# Patient Record
Sex: Female | Born: 1985
Health system: Southern US, Community
[De-identification: ages and names within clinical notes are randomized; demographics above are authoritative.]

## PROBLEM LIST (undated history)

## (undated) ENCOUNTER — Inpatient Hospital Stay (HOSPITAL_COMMUNITY): Payer: Self-pay

## (undated) DIAGNOSIS — H0520 Unspecified exophthalmos: Secondary | ICD-10-CM

## (undated) DIAGNOSIS — D696 Thrombocytopenia, unspecified: Secondary | ICD-10-CM

## (undated) DIAGNOSIS — Z3490 Encounter for supervision of normal pregnancy, unspecified, unspecified trimester: Secondary | ICD-10-CM

## (undated) DIAGNOSIS — E063 Autoimmune thyroiditis: Secondary | ICD-10-CM

## (undated) HISTORY — DX: Unspecified exophthalmos: H05.20

## (undated) HISTORY — PX: NO PAST SURGERIES: SHX2092

## (undated) HISTORY — DX: Encounter for supervision of normal pregnancy, unspecified, unspecified trimester: Z34.90

## (undated) HISTORY — DX: Autoimmune thyroiditis: E06.3

---
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
## 2004-05-11 ENCOUNTER — Emergency Department (HOSPITAL_COMMUNITY): Admission: EM | Admit: 2004-05-11 | Discharge: 2004-05-11 | Payer: Self-pay | Admitting: *Deleted

## 2004-07-31 ENCOUNTER — Ambulatory Visit (HOSPITAL_COMMUNITY): Admission: RE | Admit: 2004-07-31 | Discharge: 2004-07-31 | Payer: Self-pay | Admitting: Obstetrics and Gynecology

## 2004-08-27 ENCOUNTER — Other Ambulatory Visit: Admission: RE | Admit: 2004-08-27 | Discharge: 2004-08-27 | Payer: Self-pay | Admitting: Obstetrics and Gynecology

## 2004-12-15 ENCOUNTER — Emergency Department (HOSPITAL_COMMUNITY): Admission: EM | Admit: 2004-12-15 | Discharge: 2004-12-16 | Payer: Self-pay | Admitting: Emergency Medicine

## 2005-02-06 ENCOUNTER — Encounter: Admission: RE | Admit: 2005-02-06 | Discharge: 2005-02-06 | Payer: Self-pay | Admitting: Obstetrics and Gynecology

## 2005-03-17 ENCOUNTER — Ambulatory Visit (HOSPITAL_COMMUNITY): Admission: RE | Admit: 2005-03-17 | Discharge: 2005-03-17 | Payer: Self-pay | Admitting: Obstetrics and Gynecology

## 2005-03-17 ENCOUNTER — Ambulatory Visit: Payer: Self-pay | Admitting: *Deleted

## 2005-03-20 ENCOUNTER — Ambulatory Visit: Payer: Self-pay | Admitting: *Deleted

## 2005-03-23 ENCOUNTER — Inpatient Hospital Stay (HOSPITAL_COMMUNITY): Admission: AD | Admit: 2005-03-23 | Discharge: 2005-03-26 | Payer: Self-pay | Admitting: Obstetrics and Gynecology

## 2005-05-08 ENCOUNTER — Ambulatory Visit: Payer: Self-pay | Admitting: "Endocrinology

## 2005-08-31 ENCOUNTER — Other Ambulatory Visit: Admission: RE | Admit: 2005-08-31 | Discharge: 2005-08-31 | Payer: Self-pay | Admitting: Obstetrics and Gynecology

## 2005-11-20 ENCOUNTER — Ambulatory Visit: Payer: Self-pay | Admitting: "Endocrinology

## 2006-05-28 ENCOUNTER — Ambulatory Visit: Payer: Self-pay | Admitting: Family Medicine

## 2006-07-22 ENCOUNTER — Ambulatory Visit: Payer: Self-pay | Admitting: "Endocrinology

## 2006-10-20 ENCOUNTER — Other Ambulatory Visit: Admission: RE | Admit: 2006-10-20 | Discharge: 2006-10-20 | Payer: Self-pay | Admitting: Obstetrics and Gynecology

## 2006-10-21 ENCOUNTER — Ambulatory Visit: Payer: Self-pay | Admitting: "Endocrinology

## 2009-01-04 ENCOUNTER — Inpatient Hospital Stay (HOSPITAL_COMMUNITY): Admission: AD | Admit: 2009-01-04 | Discharge: 2009-01-04 | Payer: Self-pay | Admitting: Obstetrics & Gynecology

## 2009-07-16 ENCOUNTER — Inpatient Hospital Stay (HOSPITAL_COMMUNITY): Admission: AD | Admit: 2009-07-16 | Discharge: 2009-07-16 | Payer: Self-pay | Admitting: Obstetrics & Gynecology

## 2009-07-23 ENCOUNTER — Inpatient Hospital Stay (HOSPITAL_COMMUNITY): Admission: AD | Admit: 2009-07-23 | Discharge: 2009-07-25 | Payer: Self-pay | Admitting: Obstetrics

## 2010-04-03 ENCOUNTER — Ambulatory Visit: Payer: Self-pay | Admitting: "Endocrinology

## 2010-09-25 ENCOUNTER — Ambulatory Visit (INDEPENDENT_AMBULATORY_CARE_PROVIDER_SITE_OTHER): Payer: Medicaid Other | Admitting: "Endocrinology

## 2010-09-25 DIAGNOSIS — E038 Other specified hypothyroidism: Secondary | ICD-10-CM

## 2010-10-05 LAB — CBC
HCT: 35 % — ABNORMAL LOW (ref 36.0–46.0)
Hemoglobin: 10.8 g/dL — ABNORMAL LOW (ref 12.0–15.0)
Hemoglobin: 11.6 g/dL — ABNORMAL LOW (ref 12.0–15.0)
MCHC: 33.2 g/dL (ref 30.0–36.0)
MCV: 94.2 fL (ref 78.0–100.0)
Platelets: 79 10*3/uL — ABNORMAL LOW (ref 150–400)
Platelets: 91 10*3/uL — ABNORMAL LOW (ref 150–400)
RDW: 13.5 % (ref 11.5–15.5)

## 2010-10-27 LAB — POCT PREGNANCY, URINE: Preg Test, Ur: POSITIVE

## 2010-10-27 LAB — URINALYSIS, ROUTINE W REFLEX MICROSCOPIC
Bilirubin Urine: NEGATIVE
Glucose, UA: NEGATIVE mg/dL
Nitrite: NEGATIVE
Specific Gravity, Urine: 1.015 (ref 1.005–1.030)
pH: 6 (ref 5.0–8.0)

## 2010-10-27 LAB — CBC
Hemoglobin: 12.7 g/dL (ref 12.0–15.0)
MCHC: 35.5 g/dL (ref 30.0–36.0)
Platelets: 126 10*3/uL — ABNORMAL LOW (ref 150–400)
RDW: 13.2 % (ref 11.5–15.5)
WBC: 6.4 10*3/uL (ref 4.0–10.5)

## 2010-10-27 LAB — GC/CHLAMYDIA PROBE AMP, GENITAL: Chlamydia, DNA Probe: NEGATIVE

## 2010-10-27 LAB — WET PREP, GENITAL: WBC, Wet Prep HPF POC: NONE SEEN

## 2010-10-27 LAB — URINE MICROSCOPIC-ADD ON

## 2010-12-04 ENCOUNTER — Encounter: Payer: Self-pay | Admitting: *Deleted

## 2010-12-04 DIAGNOSIS — E038 Other specified hypothyroidism: Secondary | ICD-10-CM

## 2010-12-04 DIAGNOSIS — E0500 Thyrotoxicosis with diffuse goiter without thyrotoxic crisis or storm: Secondary | ICD-10-CM | POA: Insufficient documentation

## 2010-12-05 NOTE — H&P (Signed)
NAMEREBA, HULETT NO.:  [id number]   MEDICAL RECORD NO.:  [id number]          PATIENT TYPE:  MAT   LOCATION:  [facility]                          [facility]:  [facility]   PHYSICIAN:  Charles A. Delcambre, MDDATE OF BIRTH:  10-16-1985   DATE OF ADMISSION:  03/23/2005  DATE OF DISCHARGE:                                HISTORY & PHYSICAL   This patient to be admitted on March 23, 2005, for induction at 41-3  weeks estimated gestational age for post dates.  She is a 25 year old, para  0-0-0-0, [facility] March 14, 2005.   PRENATAL LABS:  O positive.  Antibody screen negative.  Sickle cell trait  negative.  VDRL nonreactive.  Rubella immune.  Hepatitis B surface antigen  negative.  HIV negative.  TSH has been consistent with hypothyroidism, a  most recent TSH 19.  At the time of this dictation pending repeat with  Levothyroxine at 200 mcg per day.  Pending repeat of TSH today.  Pap test  negative.  Quad screen negative.  One hour Glucola 113.  Hemoglobin 10.7 at  28 weeks.  Group B strep negative.   PAST MEDICAL HISTORY:  1.  Hypothyroidism.  2.  Anemia.   SURGICAL HISTORY:  None.   MEDICATIONS:  1.  Levothyroxine 200 mcg per day.  2.  Prenatal vitamins.  3.  Iron daily.   ALLERGIES:  No known drug allergies.   SOCIAL HISTORY:  No tobacco, ethanol, or drug abuse.  The patient is married  in a monogamous relationship with her husband.   FAMILY HISTORY:  No major family illnesses other than a maternal aunt with  breast cancer, diabetes in a maternal cousin.   REVIEW OF SYSTEMS:  No fever, chills, nausea, vomiting, diarrhea,  constipation, or bleeding, rupture of membranes, or significant contractions  at the time of this dictation.   PHYSICAL EXAMINATION:  GENERAL:  Alert and oriented  x3.  No distress.  VITAL SIGNS:  Blood pressure 110/70, weight 166 pounds today, respirations  18, pulse 90.  HEENT:  Grossly within normal limits.  NECK:  Supple without thyromegaly  or adenopathy.  LUNGS:  Clear bilaterally.  BACK:  No CVAT.  Vertebral column nontender to palpation.  BREASTS:  No masses, tenderness, discharge, skin or nipple changes  bilaterally.  ABDOMEN:  Soft, nontender.  Fundal height 39.  Right axilla has a fatty  tumor mass that has been negative on ultrasound, raised up beyond the tail  of the breast.  This will be followed till after pregnancy.  May undergo  needle biopsy or something of that nature but it has appeared normal on  ultrasound, off of the breast, not part of the breast, is basically  asymptomatic except for the mass effect and it is soft and nontender.  PELVIC:  Cervix is checked and is tight 3, 75% effaced, -2 station , vertex,  and intact.  EXTREMITIES:  Mild edema bilaterally.  Nontender.   ASSESSMENT:  The patient will be admitted at 41 weeks 3 days to undergo  induction.   If inappropriate for Cervidil that evening, we  will begin Pitocin over  night.  All questions were answered.  She will present if not in labor by  that point.  In the meanwhile, she will have an appointment with Dr.  Mia Creek in my absence from the clinic next week.      Charles A. Sydnee Cabal, MD  Electronically Signed     CAD/MEDQ  D:  03/11/2005  T:  03/11/2005  Job:  [id number]

## 2010-12-05 NOTE — Op Note (Signed)
Grace Wall, Grace Wall               ACCOUNT NO.:  [id number]   MEDICAL RECORD NO.:  [id number]          PATIENT TYPE:  INP   LOCATION:  9167                          [facility]  [facility]   PHYSICIAN:  Charles A. Delcambre, MDDATE OF BIRTH:  [birth year]   DATE OF PROCEDURE:  03/24/2005  DATE OF DISCHARGE:                                 OPERATIVE REPORT   This patient admitted for induction at 41 weeks 3 days, for induction  secondary to post dates.  See history and physical.  She was noted to be 3.5  cm, 90% effaced, -1 station, bulging bag of water, at 0710.  Artificial  rupture of membranes was done.  At that time no complications and clear  fluid was noted.  Unknown group B strep was noted, and she was started on  ampicillin this morning at the time of induction and for that reason, after  being started, this was continued; however, during latent labor she was  noted to have negative group B strep per office chart.  She became  completely dilated with Pitocin.  After Cervidil was removed, Pitocin was  started up to 4 milli-international units per minute, lowered later to 3  milli-international units per minute, with reassuring fetal heart rate  tracing.  She progressed rapidly.  Once becoming active, an epidural was  placed and she became completely dilated at 1117 and delivered by  spontaneous vaginal delivery at 1228.  The placenta followed at 1239.  She  had a vigorous female, Apgars 9 and 9.  The placenta was spontaneous, three-  vessel and intact.  The estimated blood loss was 400 mL.  Right sidewall  laceration was noted, repaired with 2-0 Vicryl and 3-0 chromic with local  anesthetic on top of the epidural.  Father of the baby cut the cord and a  nuchal cord x1 was reduced to effect delivery.  Mother and baby were  recovering stably at this time.      Charles A. Sydnee Cabal, MD  Electronically Signed     CAD/MEDQ  D:  03/24/2005  T:  03/24/2005  Job:  [id number]

## 2011-01-09 ENCOUNTER — Inpatient Hospital Stay (INDEPENDENT_AMBULATORY_CARE_PROVIDER_SITE_OTHER)
Admission: RE | Admit: 2011-01-09 | Discharge: 2011-01-09 | Disposition: A | Discharge status: Home / Self Care | Payer: Medicaid Other | Source: Ambulatory Visit | Attending: Family Medicine | Admitting: Family Medicine

## 2011-01-09 DIAGNOSIS — N6459 Other signs and symptoms in breast: Secondary | ICD-10-CM

## 2011-01-09 DIAGNOSIS — N644 Mastodynia: Secondary | ICD-10-CM

## 2011-03-24 ENCOUNTER — Other Ambulatory Visit: Payer: Self-pay | Admitting: "Endocrinology

## 2011-03-26 ENCOUNTER — Encounter: Payer: Self-pay | Admitting: "Endocrinology

## 2011-03-26 ENCOUNTER — Ambulatory Visit (INDEPENDENT_AMBULATORY_CARE_PROVIDER_SITE_OTHER): Payer: Medicaid Other | Admitting: "Endocrinology

## 2011-03-26 VITALS — BP 105/63 | HR 74 | Wt 145.5 lb

## 2011-03-26 DIAGNOSIS — E063 Autoimmune thyroiditis: Secondary | ICD-10-CM

## 2011-03-26 DIAGNOSIS — H0520 Unspecified exophthalmos: Secondary | ICD-10-CM | POA: Insufficient documentation

## 2011-03-26 DIAGNOSIS — E049 Nontoxic goiter, unspecified: Secondary | ICD-10-CM

## 2011-03-26 DIAGNOSIS — E038 Other specified hypothyroidism: Secondary | ICD-10-CM

## 2011-03-26 DIAGNOSIS — E0500 Thyrotoxicosis with diffuse goiter without thyrotoxic crisis or storm: Secondary | ICD-10-CM | POA: Insufficient documentation

## 2011-03-26 NOTE — Patient Instructions (Signed)
Followup visit in 6 months. Please take Synthroid, 187.5 mcg, 6 days per week. On 7 stay please take only 150 micrograms of Synthroid.

## 2011-03-26 NOTE — Progress Notes (Signed)
Subjective:  Patient Name: Grace Wall Date of Birth: 02/01/1986  MRN: [id number]  Sherion Dooly  presents to the office today for follow-up of her hypothyroidism, Hashimoto's disease, goiter and exophthalmos  HISTORY OF PRESENT ILLNESS:   Grace Wall is a 25 y.o. [place] young woman. Grace Wall was unaccompanied.  1. The patient was first referred to me by herself on 05/08/05. She was diagnosed with thyroid problems at age 77 to at that point she was hyperthyroid, had a big anterior neck, and had big eyes. She was placed on Tapazole ((methimazole). Tapazole was subsequently stopped at about age 35. About one year later, she started Synthroid. The Synthroid was changed to generic levothyroxine in 2005. The patient's eyes have gradually improved over time. She was feeling fairly well, but was tired a lot. She was also somewhat mentally confused at times. On physical examination she had intermittent proptosis bilaterally. The thyroid gland was 20+ grams in size. The right lobe was within normal limits, but the left lobe was slightly enlarged. Laboratory data showed a TSH of 3.09, free T4-1 0.22, and free T3 of 2.6. Her TPO antibody level was 1171.1, with normals being less than 60. A thyroid-stimulating immunoglobulin level was 64, with normals being <129. The thyroid finding inhibitory immunoglobulin was 96, with normas being less than 17. Her TSH receptor antibody was 87, with normals being less than 10. 2 the high levels of the thyroid receptor antibody, it was unclear how her thyroid tests would evolve. Repeat thyroid hormone test performed at 05/18/2005 were markedly different. At this time the TSH was 0.032, and free T4 was 1.77, and free T3 was 3.9. At that point it was still unclear whether the fluctuations in thyroid hormone were due to changes in the forms of levothyroxine made by different companies, or to  changes i thyroid receptor antibody levels, or both. I changed her to brand Synthroid at a dose of  150 mcg per day. During the past 6 years we've had to adjust her thyroid hormone doses frequently. Her TSH values have ranged from 0.04 - 8.58. Part of these fluctuations have been due to flareups of Hashimoto's disease. In addition, because she does not have any health insurance, she's had to rely on thyroid hormone samples. Sometimes she's not always had exactly the right thyroid hormone doses. She also still appears to have fluctuations in her thyroid receptor antibodies which have caused changes in her thyroid hormone levels.  Because of her lack of insurance, however, we not been able to repeat her thyroid receptor antibody testing.  2. The patient's last [facility] visit was on 09/25/10. In the interim, has been fairly healthy. Her current Synthroid dose is 150 mcg per day +37.5 mcg per day (one-half of a 75 mcg  tablet) for total of 187.5 mcg per day.She has been on this dose for approximately 6 months. 3. Pertinent Review of Systems:  Constitutional: The patient feels well, is healthy, and has no significant complaints. Eyes: Vision is good. There are no significant eye complaints. Her right eye is always somewhat more prominent than her left Neck: The patient has no complaints of anterior neck swelling, soreness, tenderness,  pressure, discomfort, or difficulty swallowing.  Heart: Heart rate increases with exercise or other physical activity. The patient has no complaints of palpitations, irregular heat beats, chest pain, or chest pressure. Gastrointestinal: Bowel movents seem normal. The patient has no complaints of excessive hunger, acid reflux, upset stomach, stomach aches or pains, diarrhea, or constipation. Legs: Muscle mass  and strength seem normal. There are no complaints of numbness, tingling, burning, or pain. No edema is noted. Feet: There are no obvious foot problems. There are no complaints of numbness, tingling, burning, or pain. No edema is noted. GYN/GU: Her LMP was 03/02/11. Her periods  have been fairly regular.   PAST MEDICAL, FAMILY, AND SOCIAL HISTORY:  Past Medical History  Diagnosis Date  . Hypothyroidism, acquired, autoimmune   . Thyroiditis, autoimmune   . Thyrotoxicosis with diffuse goiter   . Exophthalmos   . Graves disease   . Graves' disease     Family History  Problem Relation Age of Onset  . Cancer Maternal Aunt   . Thyroid disease Son     Current outpatient prescriptions:levothyroxine (SYNTHROID, LEVOTHROID) 200 MCG tablet, Take 200 mcg by mouth daily.  , Disp: , Rfl: ;  Multiple Vitamin (MULTIVITAMIN) tablet, Take 1 tablet by mouth daily.  , Disp: , Rfl:   Allergies as of 03/26/2011  . (No Known Allergies)    1. 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 2. Activities: Family activities; she is now enrolled in the [employer]. She will be able to have clinical visits with me and certain laboratory tests at no additional cost. 3. Smoking, alcohol, or drugs: None 4. Primary Care Provider: Carollee Herter, MD, MD  ROS: There are no other significant problems involving the patient's other six body systems.   Objective:  Vital Signs:  BP 105/63  Pulse 74  Wt 145 lb 8 oz (65.998 kg)   Ht Readings from Last 3 Encounters:  No data found for Ht   Wt Readings from Last 3 Encounters:  03/26/11 145 lb 8 oz (65.998 kg)   PHYSICAL EXAM: Constitutional: The patient appears healthy and well nourished.  Eyes:  There is no obvious arcus. She has slight inferior proptosis of the right eye. Extraocular movements of her eyes are normal. She feels no sense of eye muscle pressure when looking upward and left or looking upward and right. Moisture appears normal. Mouth: The oropharynx and tongue appear normal. Oral moisture is normal. Neck: The neck appears to be visibly normal. No carotid bruits are noted. The thyroid gland is 20+ grams in size. The right lobe is within normal size. Left lobe is  slightly enlarged. The consistency of the thyroid gland is normal. The thyroid gland is not tender to palpation. Lungs: The lungs are clear to auscultation. Air movement is good. Heart: Heart rate and rhythm are regular.Heart sounds S1 and S2 are normal. I did not appreciate any pathologic cardiac murmurs. Abdomen: The abdomen appears to be normal in size for the patient's age. Bowel sounds are normal. There is no obvious hepatomegaly, splenomegaly, or other mass effect.  Arms: Muscle size and bulk are normal for age. Hands: There is no obvious tremor. Phalangeal and metacarpophalangeal joints are normal. Palmar muscles are normal for age. Palmar skin is normal. Palmar moisture is also normal. Legs: Muscles appear normal for age. No edema is present. Feet: Feet are normally formed. Dorsalis pedal pulses are normal. Neurologic: Strength is normal for age in both the upper and lower extremities. Muscle tone is normal. Sensation to touch is normal in both the legs and feet.    LAB DATA: 9/0/12 TSH was 0.38.   Assessment and Plan:   ASSESSMENT: 1. Hypothyroid/hyperthyroid: The patient is mildly hyperthyroid at this time. She needs a small decrease in Synthroid dosage. 2. Thyroiditis: Her  Hashimoto's disease is clinically quiescent. 3. goiter: Her thyroid gland has shrunk down to almost normal size. 4. Exophthalmos: The right eye remains just slightly prominent.  PLAN: 1. Diagnostic: Repeat her thyroid tests one week prior to her next visit. 2. Therapeutic: I gave the patient samples of Synthroid, 175 mcg per day 3. Patient education: We discussed the fact that we will probably have to adjust her thyroid dose doses every 6-12 months. 4. Follow-up: Return in about 6 months (around 09/23/2011).

## 2011-06-03 ENCOUNTER — Emergency Department (HOSPITAL_COMMUNITY)
Admission: EM | Admit: 2011-06-03 | Discharge: 2011-06-03 | Disposition: A | Discharge status: Home / Self Care | Payer: Medicaid Other | Source: Home / Self Care | Attending: Family Medicine | Admitting: Family Medicine

## 2011-06-03 ENCOUNTER — Encounter (HOSPITAL_COMMUNITY): Payer: Self-pay | Admitting: Emergency Medicine

## 2011-06-03 DIAGNOSIS — J069 Acute upper respiratory infection, unspecified: Secondary | ICD-10-CM

## 2011-06-03 DIAGNOSIS — R05 Cough: Secondary | ICD-10-CM

## 2011-06-03 MED ORDER — AZITHROMYCIN 250 MG PO TABS: 250.0000 mg | ORAL_TABLET | Freq: Every day | ORAL | Status: AC

## 2011-06-03 MED ORDER — GUAIFENESIN-CODEINE 100-10 MG/5ML PO SYRP: 5.0000 mL | ORAL_SOLUTION | Freq: Three times a day (TID) | ORAL | Status: AC | PRN

## 2011-06-03 NOTE — ED Notes (Signed)
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

## 2011-06-03 NOTE — ED Provider Notes (Signed)
History     CSN: [id number] Arrival date & time: 06/03/2011 10:44 AM   First MD Initiated Contact with Patient 06/03/11 1028      Chief Complaint  Patient presents with  . Sinusitis  . Cough    (Consider location/radiation/quality/duration/timing/severity/associated sxs/prior treatment) Patient is a 25 y.o. female presenting with sinusitis and cough. The history is provided by the patient.  Sinusitis  This is a new problem. The problem has been resolved. Associated symptoms include chills, congestion and cough. Pertinent negatives include no ear pain, no sore throat and no shortness of breath.  Cough The current episode started 12 to 24 hours ago. The problem occurs constantly. The cough is productive of sputum. There has been no fever. Associated symptoms include chills, rhinorrhea and myalgias. Pertinent negatives include no ear pain, no sore throat, no shortness of breath and no wheezing. She has tried decongestants for the symptoms.    Past Medical History  Diagnosis Date  . Hypothyroidism, acquired, autoimmune   . Thyroiditis, autoimmune   . Thyrotoxicosis with diffuse goiter   . Exophthalmos   . Graves disease   . Graves' disease     History reviewed. No pertinent past surgical history.  Family History  Problem Relation Age of Onset  . Cancer Maternal Aunt   . Thyroid disease Son     History  Substance Use Topics  . Smoking status: Never Smoker   . Smokeless tobacco: Not on file  . Alcohol Use: Yes     ocassionally    OB History    Grav Para Term Preterm Abortions TAB SAB Ect Mult Living                  Review of Systems  Constitutional: Positive for fever and chills.  HENT: Positive for congestion, rhinorrhea and sneezing. Negative for ear pain, sore throat and trouble swallowing.   Eyes: Negative.   Respiratory: Positive for cough. Negative for shortness of breath and wheezing.   Cardiovascular: Negative.   Gastrointestinal: Negative.     Genitourinary: Negative.   Musculoskeletal: Positive for myalgias.  Skin: Negative.     Allergies  Review of patient's allergies indicates no known allergies.  Home Medications   Current Outpatient Rx  Name Route Sig Dispense Refill  . LEVOTHYROXINE SODIUM 200 MCG PO TABS Oral Take 200 mcg by mouth daily.      Marland Kitchen ONE-DAILY MULTI VITAMINS PO TABS Oral Take 1 tablet by mouth daily.        BP 116/65  Pulse 88  Temp(Src) 97.9 F (36.6 C) (Oral)  Resp 16  SpO2 99%  Physical Exam  Constitutional: She is oriented to person, place, and time. She appears well-developed and well-nourished.  HENT:  Head: Normocephalic and atraumatic.  Right Ear: Tympanic membrane and external ear normal.  Left Ear: Tympanic membrane and external ear normal.  Nose: Nose normal.  Mouth/Throat: Uvula is midline, oropharynx is clear and moist and mucous membranes are normal.  Eyes: EOM are normal. Pupils are equal, round, and reactive to light.  Neck: Normal range of motion.  Cardiovascular: Normal rate and regular rhythm.   Pulmonary/Chest: Effort normal and breath sounds normal.  Neurological: She is alert and oriented to person, place, and time.  Skin: Skin is warm and dry.    ED Course  Procedures (including critical care time)  Labs Reviewed - No data to display No results found.   No diagnosis found.    MDM  Richardo Priest, MD 06/03/11 [id number]

## 2011-09-23 ENCOUNTER — Encounter: Payer: Self-pay | Admitting: "Endocrinology

## 2011-09-23 ENCOUNTER — Ambulatory Visit: Payer: Medicaid Other | Admitting: "Endocrinology

## 2011-09-24 ENCOUNTER — Other Ambulatory Visit: Payer: Self-pay | Admitting: "Endocrinology

## 2011-09-25 LAB — TSH: TSH: 0.111 u[IU]/mL — ABNORMAL LOW (ref 0.350–4.500)

## 2011-12-15 ENCOUNTER — Other Ambulatory Visit: Payer: Self-pay | Admitting: *Deleted

## 2011-12-15 DIAGNOSIS — E038 Other specified hypothyroidism: Secondary | ICD-10-CM

## 2011-12-22 LAB — T3, FREE: T3, Free: 2.7 pg/mL (ref 2.3–4.2)

## 2011-12-24 ENCOUNTER — Encounter: Payer: Self-pay | Admitting: "Endocrinology

## 2011-12-24 ENCOUNTER — Ambulatory Visit (INDEPENDENT_AMBULATORY_CARE_PROVIDER_SITE_OTHER): Payer: Medicaid Other | Admitting: "Endocrinology

## 2011-12-24 VITALS — BP 102/67 | HR 78 | Wt 142.8 lb

## 2011-12-24 DIAGNOSIS — E063 Autoimmune thyroiditis: Secondary | ICD-10-CM

## 2011-12-24 DIAGNOSIS — E049 Nontoxic goiter, unspecified: Secondary | ICD-10-CM

## 2011-12-24 DIAGNOSIS — E038 Other specified hypothyroidism: Secondary | ICD-10-CM

## 2011-12-24 DIAGNOSIS — H0520 Unspecified exophthalmos: Secondary | ICD-10-CM

## 2011-12-24 NOTE — Patient Instructions (Signed)
Follow-up visit in six months. Repeat TFTs one week prior to next visit. Take one 175 mcg Synthroid pill 5 days per week. Skip Synthroid on Sunday. Take one-half of a 175 pill on Wednesday.

## 2011-12-24 NOTE — Progress Notes (Signed)
Subjective:  Patient Name: Grace Wall Date of Birth: 04/12/1986  MRN: [id number]  Grace Wall  presents to the office today for follow-up of her hypothyroidism, Hashimoto's disease, goiter and exophthalmos  HISTORY OF PRESENT ILLNESS:   Grace Wall is a 26 y.o. [place] young woman. Grace Wall was accompanied by her 74-year-old son.  1. The patient was first referred to me by herself on 05/08/05. She was diagnosed with thyroid problems at age 59. At that point she was hyperthyroid, had a big anterior neck, and had big eyes, so she presumably had Graves' disease. She was placed on Tapazole ((methimazole). Tapazole was subsequently stopped at about age 57. About one year later, she became hypothyroid, presumably due to coexistent Hashimoto's disease. She started Synthroid. The Synthroid was changed to generic levothyroxine in 2005. The patient's eyes have gradually improved over time. She was feeling fairly well, but was tired a lot. She was also somewhat mentally confused at times. On physical examination she had intermittent proptosis bilaterally. The thyroid gland was 20+ grams in size. The right lobe was within normal limits, but the left lobe was slightly enlarged. Laboratory data showed a TSH of 3.09, free T4 1.22, and free T3 of 2.6. Her TPO antibody level was 1171.1, with normals being less than 60. A thyroid-stimulating immunoglobulin level was 64, with normals being <129. The thyroid binding inhibitory immunoglobulin was 96, with normas being less than 17. Her TSH receptor antibody was 87, with normals being less than 10. 2. Given the high levels of the thyroid receptor antibody, it was unclear how her thyroid tests would evolve. Repeat thyroid hormone tess performed on 05/18/2005 were markedly different. At this time the TSH was 0.032,  free T4 was 1.77, and free T3 was 3.9. At that point it was still unclear whether the fluctuations in thyroid hormone were due to changes in the forms of levothyroxine  made by different companies, or to  changes in thyroid receptor antibody levels, or both. I changed her to brand Synthroid at a dose of 150 mcg per day.  2. During the past 7 years we've had to adjust her thyroid hormone doses frequently. Her TSH values have ranged from 0.04 - 8.58. Part of these fluctuations have been due to flareups of Hashimoto's disease. In addition, because she does not have any health insurance, she's had to rely on thyroid hormone samples. Sometimes she's not always had exactly the right thyroid hormone doses. She also still appears to have fluctuations in her thyroid receptor antibodies which have caused changes in her thyroid hormone levels.  Because of her lack of insurance, however, we not been able to repeat her thyroid receptor antibody testing.  3. The patient's last [facility] visit was on 03/26/11. In the interim, she has been fairly healthy. Her current Synthroid dose is 175 mcg 6 days per week .  4. Pertinent Review of Systems:  Constitutional: The patient feels tired a lot. She also has pains in her stomach when she eats. She is nauseated frequently. She sleeps well for about six hours per night.   Eyes: Vision is good. There are no significant eye complaints. Her right eye is always somewhat more prominent than her left Neck: The patient has no complaints of anterior neck swelling, soreness, tenderness,  pressure, discomfort, or difficulty swallowing.  Heart: Heart rate increases with exercise or other physical activity. The patient has no complaints of palpitations, irregular heat beats, chest pain, or chest pressure. Gastrointestinal: Intermittent nausea, belly hunger, and epigastric discomfort  began about a month ago. She has been taking ibuprofen more frequently in the past month to relieve some upper back pains. The abdominal discomfort is maximal before eating and is relieved by eating. Bowel movents seem normal. The patient has no complaints of acid reflux, diarrhea, or  constipation. Legs: Muscle mass and strength seem normal. There are no complaints of numbness, tingling, burning, or pain. No edema is noted. Feet: There are no obvious foot problems. There are no complaints of numbness, tingling, burning, or pain. No edema is noted. GYN/GU: Her LMP was about mid May. Her periods have been fairly regular.   PAST MEDICAL, FAMILY, AND SOCIAL HISTORY:  Past Medical History  Diagnosis Date  . Hypothyroidism, acquired, autoimmune   . Thyroiditis, autoimmune   . Thyrotoxicosis with diffuse goiter   . Exophthalmos   . Graves disease   . Graves' disease     Family History  Problem Relation Age of Onset  . Cancer Maternal Aunt   . Thyroid disease Son     Current outpatient prescriptions:levothyroxine (SYNTHROID, LEVOTHROID) 200 MCG tablet, Take 175 mcg by mouth daily. , Disp: , Rfl: ;  Multiple Vitamin (MULTIVITAMIN) tablet, Take 1 tablet by mouth daily.  , Disp: , Rfl:   Allergies as of 12/24/2011  . (No Known Allergies)    1. 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 2. Activities: She is now enrolled in the [employer]. She will be able to have clinical visits with me and certain laboratory tests at no additional cost. She is getting Synthroid through that company's patient assistance program. She is also receiving some Medicaid assistance that allows her to have some lab testing done at [facility].  3. Smoking, alcohol, or drugs: None 4. Primary Care Provider: None, but she is listed as having Dr. Sharlot Gowda as her PCP. She will call his office to see if he will accept her as a [facility] charity patient.  ROS: There are no other significant problems involving the patient's other body systems.   Objective:  Vital Signs:  BP 102/67  Pulse 78  Wt 142 lb 12.8 oz (64.774 kg)    Wt Readings from Last 3 Encounters:  12/24/11 142 lb 12.8 oz (64.774 kg)  03/26/11 145 lb 8 oz (65.998 kg)    PHYSICAL EXAM: Constitutional: The patient appears healthy and well nourished.  Eyes:  There is no obvious arcus. The right eye is still slightly more prominent than the left. She has no inferior proptosis of the right eye today. Extraocular movements of her eyes are normal. She feels no sense of eye muscle pressure when looking upward and left or looking upward and right. Moisture appears normal. Mouth: The oropharynx and tongue appear normal. Oral moisture is normal. Neck: The neck appears to be visibly normal. No carotid bruits are noted. The thyroid gland is 20+ grams in size. The right lobe is within normal size. Left lobe is slightly enlarged. The consistency of the thyroid gland is normal. The thyroid gland is not tender to palpation. Lungs: The lungs are clear to auscultation. Air movement is good. Heart: Heart rate and rhythm are regular.Heart sounds S1 and S2 are normal. I did not appreciate any pathologic cardiac murmurs. Abdomen: The abdomen is mildly enlarged. Bowel sounds are normal. There is no obvious hepatomegaly, splenomegaly, or other mass effect.  Arms: Muscle size and bulk are normal for age. Hands: There is no obvious tremor. Phalangeal  and metacarpophalangeal joints are normal. Palmar muscles are normal for age. Palmar skin is normal. Palmar moisture is also normal. Legs: Muscles appear normal for age. No edema is present. Feet: Feet are normally formed. Dorsalis pedal pulses are normal. Neurologic: Strength is normal for age in both the upper and lower extremities. Muscle tone is normal. Sensation to touch is normal in both the legs and feet.    LAB DATA: 12/15/11: TSH was 0.520, free T4 1.40, free T3  2.7   Assessment and Plan:   ASSESSMENT: 1. Hypothyroid/hyperthyroid: The patient is mildly hyperthyroid at this time. She needs a small decrease in Synthroid dosage. 2. Thyroiditis: Her Hashimoto's disease is clinically quiescent. 3. Goiter: Her thyroid gland has shrunk  down to almost normal size. 4. Exophthalmos: The right eye is just slightly prominent today. Her exophthalmos due to Graves' disease has almost resolved. Marland Kitchen  PLAN: 1. Diagnostic: Repeat her thyroid tests one week prior to her next visit. 2. Therapeutic: Reduce Synthroid to 175 5 days per week. Skip Synthroid on Sunday and take 1/2 pill on Wednesday. 3. Patient education: We discussed the fact that we will probably have to adjust her thyroid dose doses every 6-12 months. 4. Follow-up: 6 months  Level of Service: This visit lasted in excess of 40 minutes. More than 50% of the visit was devoted to counseling.  David Stall

## 2012-06-13 ENCOUNTER — Other Ambulatory Visit: Payer: Self-pay | Admitting: *Deleted

## 2012-06-13 DIAGNOSIS — E038 Other specified hypothyroidism: Secondary | ICD-10-CM

## 2012-06-27 LAB — T4, FREE: Free T4: 1.18 ng/dL (ref 0.80–1.80)

## 2012-06-27 LAB — TSH: TSH: 3.814 u[IU]/mL (ref 0.350–4.500)

## 2012-06-27 LAB — T3, FREE: T3, Free: 2.7 pg/mL (ref 2.3–4.2)

## 2012-07-15 ENCOUNTER — Other Ambulatory Visit: Payer: Self-pay | Admitting: *Deleted

## 2012-07-15 DIAGNOSIS — E038 Other specified hypothyroidism: Secondary | ICD-10-CM

## 2012-08-03 ENCOUNTER — Encounter: Payer: Self-pay | Admitting: "Endocrinology

## 2012-08-03 ENCOUNTER — Ambulatory Visit (INDEPENDENT_AMBULATORY_CARE_PROVIDER_SITE_OTHER): Payer: Medicaid Other | Admitting: "Endocrinology

## 2012-08-03 VITALS — BP 107/52 | HR 67 | Wt 150.0 lb

## 2012-08-03 DIAGNOSIS — E0500 Thyrotoxicosis with diffuse goiter without thyrotoxic crisis or storm: Secondary | ICD-10-CM

## 2012-08-03 DIAGNOSIS — E038 Other specified hypothyroidism: Secondary | ICD-10-CM

## 2012-08-03 DIAGNOSIS — E063 Autoimmune thyroiditis: Secondary | ICD-10-CM

## 2012-08-03 DIAGNOSIS — E663 Overweight: Secondary | ICD-10-CM | POA: Insufficient documentation

## 2012-08-03 LAB — TSH: TSH: 3.078 u[IU]/mL (ref 0.350–4.500)

## 2012-08-03 LAB — T4, FREE: Free T4: 1.14 ng/dL (ref 0.80–1.80)

## 2012-08-03 NOTE — Progress Notes (Signed)
Subjective:  Patient Name: Grace Wall Date of Birth: [birth year]  MRN: [id number]  Grace Wall  presents to the office today for follow-up of her hypothyroidism, Hashimoto's disease, goiter and exophthalmos  HISTORY OF PRESENT ILLNESS:   Grace Wall is a 27 y.o. [place] young woman. Korynn was accompanied by her 45-year-old son.  1. The patient was first referred to me by herself on 05/08/05.   A. She was diagnosed with thyroid problems at age 40. At that point she was hyperthyroid, had a big anterior neck, and had big eyes, so she presumably had Graves' disease. She was placed on Tapazole ((methimazole). Tapazole was subsequently stopped at about age 45.  B. About one year later, she became hypothyroid, presumably due to coexistent Hashimoto's disease. She started Synthroid. The Synthroid was changed to generic levothyroxine in 2005. The patient's eyes had gradually improved over time. At that first visit with me, she was feeling fairly well, but was tired a lot. She was also somewhat mentally confused at times.   C. On physical examination she had intermittent proptosis bilaterally. The thyroid gland was 20+ grams in size. The right lobe was within normal limits, but the left lobe was slightly enlarged. Laboratory data showed a TSH of 3.09, free T4 1.22, and free T3 of 2.6. Her TPO antibody level was 1171.1, with normals being less than 60. A thyroid-stimulating immunoglobulin level was 64, with normals being <129. The thyroid binding inhibitory immunoglobulin was 96, with normals being less than 17. Her TSH receptor antibody was 87, with normals being less than 10. 2. Given the high levels of the thyroid receptor antibody, it was unclear how her thyroid tests would evolve. Repeat thyroid hormone tess performed on 05/18/2005 were markedly different. At this time the TSH was 0.032,  free T4 was 1.77, and free T3 was 3.9. At that point it was still unclear whether the fluctuations in thyroid hormone were  due to changes in the forms of levothyroxine made by different companies, or to  changes in thyroid receptor antibody levels, or both. I changed her to brand Synthroid at a dose of 150 mcg per day.  2. During the past 8 years we've had to adjust her thyroid hormone doses frequently. Her TSH values have ranged from 0.04 - 8.58. Part of these fluctuations have been due to flare ups of Hashimoto's disease. In addition, because she does not have any health insurance, she's had to rely on thyroid hormone samples. Sometimes she's not always had exactly the right thyroid hormone doses. She also still appears to have fluctuations in her thyroid receptor antibodies which have caused changes in her thyroid hormone levels.  Because of her lack of insurance, however, we not been able to repeat her thyroid receptor antibody testing.  3. The patient's last [facility] visit was on 12/24/11. In the interim, she has been fairly healthy. Her current Synthroid dose is 175 mcg 5 days per week, but only 1/2 pill on Wednesdays and Sundays. .  4. Pertinent Review of Systems:  Constitutional: The patient feels "good, better". Sometimes she feels tired in her eyes as well as generally tired. The stomach pains and nausea have resolved after stopping ibuprofen.  She sleeps well for about six hours per night.   Eyes: Vision is good. There are no significant eye complaints. Her right eye is always very slightly more prominent than her left Neck: The patient has no complaints of anterior neck swelling, soreness, tenderness,  pressure, discomfort, or difficulty swallowing.  Heart:  Heart rate increases with exercise or other physical activity. The patient has no complaints of palpitations, irregular heat beats, chest pain, or chest pressure. Gastrointestinal: She stopped taking ibuprofen after her last visit. Bowel movents seem normal. The patient has no complaints of acid reflux, diarrhea, or constipation. Legs: Muscle mass and strength seem  normal. There are no complaints of numbness, tingling, burning, or pain. No edema is noted. Feet: There are no obvious foot problems. There are no complaints of numbness, tingling, burning, or pain. No edema is noted. GYN/GU: Her LMP was about mid-December. Her periods have been fairly regular.   PAST MEDICAL, FAMILY, AND SOCIAL HISTORY:  Past Medical History  Diagnosis Date  . Hypothyroidism, acquired, autoimmune   . Thyroiditis, autoimmune   . Thyrotoxicosis with diffuse goiter   . Exophthalmos   . Graves disease   . Graves' disease     Family History  Problem Relation Age of Onset  . Cancer Maternal Aunt   . Thyroid disease Son     Current outpatient prescriptions:levothyroxine (SYNTHROID, LEVOTHROID) 200 MCG tablet, Take 175 mcg by mouth daily. , Disp: , Rfl: ;  Multiple Vitamin (MULTIVITAMIN) tablet, Take 1 tablet by mouth daily.  , Disp: , Rfl:   Allergies as of 08/03/2012  . (No Known Allergies)    1. 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 2. Activities: She is now enrolled in the [employer]. She will be able to have clinical visits with me and certain laboratory tests at no additional cost. She is getting Synthroid through that company's patient assistance program. She is also receiving some Medicaid assistance that allows her to have some lab testing done at [facility].  3. Smoking, alcohol, or drugs: None 4. Primary Care Provider: None. She called Dr. Ilene Qua office to see if he would accept her as a [facility] charity patient, but he would not.   REVIEW OF SYSTEMS: There are no other significant problems involving the patient's other body systems.   Objective:  Vital Signs:  BP 107/52  Pulse 67  Wt 150 lb (68.04 kg)    Wt Readings from Last 3 Encounters:  08/03/12 150 lb (68.04 kg)  12/24/11 142 lb 12.8 oz (64.774 kg)  03/26/11 145 lb 8 oz (65.998 kg)   PHYSICAL EXAM: Constitutional: The  patient appears healthy and well nourished.  Eyes:  There is no obvious arcus. The right eye is only minimally more prominent than the left, but still well within normal. She has no inferior proptosis of the right eye today. Extraocular movements of her eyes are normal. She feels no sense of eye muscle pressure when looking upward and left or looking upward and right. Moisture appears normal. Mouth: The oropharynx and tongue appear normal. Oral moisture is normal. Neck: The neck appears to be visibly normal. No carotid bruits are noted. The thyroid gland is 20+ grams in size. The right lobe is within normal size. Left lobe is minimally enlarged. The consistency of the thyroid gland is normal. The thyroid gland is not tender to palpation. Lungs: The lungs are clear to auscultation. Air movement is good. Heart: Heart rate and rhythm are regular. Heart sounds S1 and S2 are normal. I did not appreciate any pathologic cardiac murmurs. Abdomen: The abdomen is mildly enlarged. Bowel sounds are normal. There is no obvious hepatomegaly, splenomegaly, or other mass effect.  Arms: Muscle size and bulk are normal for age. Hands: There  is no obvious tremor. Phalangeal and metacarpophalangeal joints are normal. Palmar muscles are normal for age. Palmar skin is normal. Palmar moisture is also normal. Legs: Muscles appear normal for age. No edema is present. Neurologic: Strength is normal for age in both the upper and lower extremities. Muscle tone is normal. Sensation to touch is normal in both legs.    LAB DATA:  08/02/12: TSH 3.078, free T4 1.14, free T3 2.7 12/15/11: TSH was 0.520, free T4 1.40, free T3  2.7   Assessment and Plan:   ASSESSMENT: 1. Hypothyroid/hyperthyroid: The patient is very slightly hypothyroid at this time. She needs a small increase in Synthroid dosage. 2. Thyroiditis: Her Hashimoto's disease is clinically quiescent. 3. Goiter: Her thyroid gland is even smaller today, essentially within  normal limits for size. 4. Exophthalmos: The right eye is just minimally prominent today. If I did not know she had had Graves' eye disease, I would never know. Her exophthalmos due to Graves' disease has essentially resolved.  5. Overweight: she has gained 8 pounds in the past 6 months, equivalent to about 130 calories per day.   PLAN: 1. Diagnostic: Repeat her thyroid tests in 3 months and one week prior to her next visit. 2. Therapeutic: Increase Synthroid to 175 mcg pills 6 days per week, but take only 1/2 pill on Sundays.  3. Patient education: We discussed the fact that we will probably have to adjust her thyroid dose doses every 6-12 months. 4. Follow-up: 6 months  Level of Service: This visit lasted in excess of 40 minutes. More than 50% of the visit was devoted to counseling.  David Stall

## 2012-08-03 NOTE — Patient Instructions (Signed)
Follow up visit in 6 months. Please increase your Synthroid dose to one 175 mcg pill 6 days per week, but only one-half pill on Sundays.

## 2013-01-02 ENCOUNTER — Other Ambulatory Visit: Payer: Self-pay | Admitting: *Deleted

## 2013-01-02 DIAGNOSIS — E038 Other specified hypothyroidism: Secondary | ICD-10-CM

## 2013-01-31 LAB — T3, FREE: T3, Free: 2.8 pg/mL (ref 2.3–4.2)

## 2013-02-01 ENCOUNTER — Ambulatory Visit (INDEPENDENT_AMBULATORY_CARE_PROVIDER_SITE_OTHER): Payer: Medicaid Other | Admitting: "Endocrinology

## 2013-02-01 ENCOUNTER — Encounter: Payer: Self-pay | Admitting: "Endocrinology

## 2013-02-01 VITALS — BP 86/51 | HR 72 | Wt 154.1 lb

## 2013-02-01 DIAGNOSIS — R1013 Epigastric pain: Secondary | ICD-10-CM

## 2013-02-01 DIAGNOSIS — R110 Nausea: Secondary | ICD-10-CM

## 2013-02-01 DIAGNOSIS — R5383 Other fatigue: Secondary | ICD-10-CM

## 2013-02-01 DIAGNOSIS — K3189 Other diseases of stomach and duodenum: Secondary | ICD-10-CM

## 2013-02-01 DIAGNOSIS — R5381 Other malaise: Secondary | ICD-10-CM

## 2013-02-01 DIAGNOSIS — I951 Orthostatic hypotension: Secondary | ICD-10-CM

## 2013-02-01 DIAGNOSIS — E0500 Thyrotoxicosis with diffuse goiter without thyrotoxic crisis or storm: Secondary | ICD-10-CM

## 2013-02-01 DIAGNOSIS — E038 Other specified hypothyroidism: Secondary | ICD-10-CM

## 2013-02-01 DIAGNOSIS — E063 Autoimmune thyroiditis: Secondary | ICD-10-CM

## 2013-02-01 DIAGNOSIS — E663 Overweight: Secondary | ICD-10-CM

## 2013-02-01 LAB — CBC WITH DIFFERENTIAL/PLATELET
Basophils Absolute: 0 10*3/uL (ref 0.0–0.1)
Eosinophils Absolute: 0 10*3/uL (ref 0.0–0.7)
Eosinophils Relative: 1 % (ref 0–5)
MCH: 25.8 pg — ABNORMAL LOW (ref 26.0–34.0)
MCV: 76 fL — ABNORMAL LOW (ref 78.0–100.0)
Monocytes Absolute: 0.3 10*3/uL (ref 0.1–1.0)
Platelets: 175 10*3/uL (ref 150–400)
RDW: 14.6 % (ref 11.5–15.5)

## 2013-02-01 MED ORDER — RANITIDINE HCL 150 MG PO TABS: 150.0000 mg | ORAL_TABLET | Freq: Two times a day (BID) | ORAL | Status: DC

## 2013-02-01 NOTE — Progress Notes (Signed)
Subjective:  Patient Name: Grace Wall Date of Birth: [birth year]  MRN: [id number]  Ron Junco  presents to the office today for follow-up of her hypothyroidism, Hashimoto's disease, goiter and exophthalmos  HISTORY OF PRESENT ILLNESS:   Shelvia is a 27 y.o. [place] young woman. Jobeth was accompanied by her 37-year-old son and 78-year-old daughter.   1. The patient was first referred to me by herself on 05/08/05.   A. She was diagnosed with thyroid problems at age 63. At that point she was hyperthyroid, had a big anterior neck, and had big eyes, so she presumably had Graves' disease. She was placed on Tapazole ((methimazole). Tapazole was subsequently stopped at about age 65.  B. About one year later, she became hypothyroid, presumably due to coexistent Hashimoto's disease. She started Synthroid. The Synthroid was changed to generic levothyroxine in 2005. The patient's eyes had gradually improved over time. At that first visit with me, she was feeling fairly well, but was tired a lot. She was also somewhat mentally confused at times.   C. On physical examination she had intermittent proptosis bilaterally. The thyroid gland was 20+ grams in size. The right lobe was within normal limits, but the left lobe was slightly enlarged. Laboratory data showed a TSH of 3.09, free T4 1.22, and free T3 of 2.6. Her TPO antibody level was 1171.1, with normals being less than 60. A thyroid-stimulating immunoglobulin level was 64, with normals being <129. The thyroid binding inhibitory immunoglobulin was 96, with normals being less than 17. Her TSH receptor antibody was 87, with normals being less than 10. 2. Given the high levels of the thyroid receptor antibody, it was unclear how her thyroid tests would evolve. Repeat thyroid hormone tests performed on 05/18/2005 were markedly different. At this time the TSH was 0.032,  free T4 was 1.77, and free T3 was 3.9. At that point it was still unclear whether the  fluctuations in thyroid hormone were due to changes in the forms of levothyroxine made by different companies, or to  changes in thyroid receptor antibody levels, or both. I changed her to brand Synthroid at a dose of 150 mcg per day.   2. During the past 8 years we've had to adjust her thyroid hormone doses frequently. Her TSH values have ranged from 0.04 - 8.58. Part of these fluctuations have been due to flare ups of Hashimoto's disease. In addition, because she does not have any health insurance, she's had to rely on thyroid hormone samples. Sometimes she's not always had exactly the right thyroid hormone doses. She also still appears to have fluctuations in her thyroid receptor antibodies which have caused changes in her thyroid hormone levels.  Because of her lack of insurance, however, we not been able to repeat her thyroid receptor antibody testing.   3. The patient's last [facility] visit was on 08/03/12. In the interim, she has been fairly healthy. Her current Synthroid dose is 175 mcg 6 days per week, but only 1/2 pill on Sundays. .   4. Pertinent Review of Systems:  Constitutional: The patient feels "good", but she still feels tired a lot. She feels that she sleeps good. She does not snore. The stomach pains and nausea have resolved after stopping ibuprofen. When she stands up she often gets dizzy, a lightheaded dizziness.    Eyes: Her eyes get blurry after using the computer or watching a lot of TV. Vision is good otherwise. There are no other significant eye complaints. Her right eye is always  very slightly more prominent than her left Neck: The patient has no complaints of anterior neck swelling, soreness, tenderness,  pressure, discomfort, or difficulty swallowing.  Heart: Heart rate increases with exercise or other physical activity. The patient has no complaints of palpitations, irregular heat beats, chest pain, or chest pressure. Gastrointestinal: She is nauseated a lot. Sometimes she feels  too sick to her stomach to eat. When she eats she feels better. She also had more reflux last week. Bowel movents seem normal. The patient has no complaints of diarrhea or constipation. Legs: Muscle mass and strength seem normal. There are no complaints of numbness, tingling, burning, or pain. No edema is noted. Feet: There are no obvious foot problems. There are no complaints of numbness, tingling, burning, or pain. No edema is noted. GYN/GU: Her LMP was last week. Her periods have been fairly regular.   PAST MEDICAL, FAMILY, AND SOCIAL HISTORY:  Past Medical History  Diagnosis Date  . Hypothyroidism, acquired, autoimmune   . Thyroiditis, autoimmune   . Thyrotoxicosis with diffuse goiter   . Exophthalmos   . Graves disease   . Graves' disease     Family History  Problem Relation Age of Onset  . Cancer Maternal Aunt   . Thyroid disease Son     Current outpatient prescriptions:levothyroxine (SYNTHROID, LEVOTHROID) 200 MCG tablet, Take 175 mcg by mouth daily. , Disp: , Rfl: ;  Multiple Vitamin (MULTIVITAMIN) tablet, Take 1 tablet by mouth daily.  , Disp: , Rfl:   Allergies as of 02/01/2013  . (No Known Allergies)    1. 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 2. Activities: She is now enrolled in the [employer]. She will be able to have clinical visits with me and certain laboratory tests at no additional cost. She is getting Synthroid through that company's patient assistance program. She is also receiving some Medicaid assistance that allows her to have some lab testing done at [facility].  3. Smoking, alcohol, or drugs: None 4. Primary Care Provider: None.   REVIEW OF SYSTEMS: There are no other significant problems involving the patient's other body systems.   Objective:  Vital Signs:  BP 86/51  Pulse 72  Wt 154 lb 1.6 oz (69.899 kg)  Repeat 100/62, HR 68  Wt Readings from Last 3 Encounters:  02/01/13 154 lb  1.6 oz (69.899 kg)  08/03/12 150 lb (68.04 kg)  12/24/11 142 lb 12.8 oz (64.774 kg)   PHYSICAL EXAM: Constitutional: The patient appears healthy and well nourished.  Eyes:  There is no obvious arcus. The right eye is normal today. The left eye is also normal, but perhaps a bit more prominent. She has no inferior proptosis of either eye today. Extraocular movements of her eyes are normal. She feels no sense of eye muscle pressure when looking upward and left or looking upward and right. Moisture appears normal. Mouth: The oropharynx and tongue appear normal. Oral moisture is normal. There is no hyperpigmentation.  Neck: The neck appears to be visibly normal. No carotid bruits are noted. The thyroid gland is smaller at 20 grams in size. The consistency of the thyroid gland is normal. The thyroid gland is not tender to palpation. Lungs: The lungs are clear to auscultation. Air movement is good. Heart: Heart rate and rhythm are regular. Heart sounds S1 and S2 are normal. I did not appreciate any pathologic cardiac murmurs. Abdomen: The abdomen is mildly enlarged. Bowel sounds are  normal. There is no obvious hepatomegaly, splenomegaly, or other mass effect.  Arms: Muscle size and bulk are normal for age. Hands: There is no obvious tremor. Phalangeal and metacarpophalangeal joints are normal. Palmar muscles are normal for age. Palmar skin is normal. Palmar moisture is also normal. There is no hyperpigmentation.  Legs: Muscles appear normal for age. No edema is present. Neurologic: Strength is normal for age in both the upper and lower extremities. Muscle tone is normal. Sensation to touch is normal in both legs.    LAB DATA:  01/30/13; TSH 1.397, free T4 1.44, free T3 2.8 08/02/12: TSH 3.078, free T4 1.14, free T3 2.7 12/15/11: TSH was 0.520, free T4 1.40, free T3  2.7   Assessment and Plan:   ASSESSMENT: 1. Hypothyroid/hyperthyroid: The patient is euthyroid now on her current Synthroid regimen. 2.  Thyroiditis: Her Hashimoto's disease is clinically quiescent. 3. Goiter: Her thyroid gland is even smaller today, now within normal limits for size. 4. Exophthalmos: The eyes are normal today. If I did not know she had had Graves' eye disease, I would not be able to diagnose it now. Her exophthalmos due to Graves' disease has essentially resolved.  5. Overweight: She has gained 4 pounds in the past 6 months, equivalent to about 80 excess calories per day.  6. Fatigue/hypotension: Her fatigue is not due to hypothyroidism. She has orthostatic hypotensive symptoms when she stands up.  This hypotension is probably due in part to relative dehydration. She admittedly doesn't drink a lot of fluids. She could have iron deficiency or anemia again. I doubt that she has Addison's disease, but we have to keep that condition in mind.  7. Nausea and dyspepsia: She may benefit from ranitidine.   PLAN: 1. Diagnostic: Repeat her thyroid tests in 6 months. CBC and iron today. 2. Therapeutic: Continue Synthroid doses of  175 mcg pills 6 days per week, but only 1/2 pill on Sundays. Ranitidine, 150 mg, twice daily.  3. Patient education: We discussed the fact that we will probably have to adjust her thyroid dose doses every 6-12 months. 4. Follow-up: 6 months  Level of Service: This visit lasted in excess of 40 minutes. More than 50% of the visit was devoted to counseling.  David Stall

## 2013-02-01 NOTE — Patient Instructions (Signed)
Follow up visit in 6 months. 

## 2013-02-02 DIAGNOSIS — R1013 Epigastric pain: Secondary | ICD-10-CM | POA: Insufficient documentation

## 2013-02-02 DIAGNOSIS — E663 Overweight: Secondary | ICD-10-CM | POA: Insufficient documentation

## 2013-02-02 DIAGNOSIS — R5381 Other malaise: Secondary | ICD-10-CM | POA: Insufficient documentation

## 2013-02-02 DIAGNOSIS — I951 Orthostatic hypotension: Secondary | ICD-10-CM | POA: Insufficient documentation

## 2013-02-06 ENCOUNTER — Other Ambulatory Visit: Payer: Self-pay | Admitting: *Deleted

## 2013-02-06 ENCOUNTER — Telehealth: Payer: Self-pay | Admitting: *Deleted

## 2013-02-06 DIAGNOSIS — E038 Other specified hypothyroidism: Secondary | ICD-10-CM

## 2013-02-06 MED ORDER — LEVOTHYROXINE SODIUM 175 MCG PO TABS: 175.0000 ug | ORAL_TABLET | Freq: Every day | ORAL | Status: DC

## 2013-02-06 NOTE — Telephone Encounter (Signed)
na

## 2013-02-07 ENCOUNTER — Other Ambulatory Visit: Payer: Self-pay | Admitting: *Deleted

## 2013-02-07 DIAGNOSIS — E038 Other specified hypothyroidism: Secondary | ICD-10-CM

## 2013-02-07 MED ORDER — LEVOTHYROXINE SODIUM 175 MCG PO TABS: 175.0000 ug | ORAL_TABLET | Freq: Every day | ORAL | Status: DC

## 2013-06-30 ENCOUNTER — Other Ambulatory Visit: Payer: Self-pay | Admitting: *Deleted

## 2013-06-30 DIAGNOSIS — E038 Other specified hypothyroidism: Secondary | ICD-10-CM

## 2013-07-21 ENCOUNTER — Emergency Department (INDEPENDENT_AMBULATORY_CARE_PROVIDER_SITE_OTHER)
Admission: EM | Admit: 2013-07-21 | Discharge: 2013-07-21 | Disposition: A | Discharge status: Home / Self Care | Payer: Medicaid Other | Source: Home / Self Care | Attending: Family Medicine | Admitting: Family Medicine

## 2013-07-21 ENCOUNTER — Encounter (HOSPITAL_COMMUNITY): Payer: Self-pay | Admitting: Emergency Medicine

## 2013-07-21 DIAGNOSIS — B356 Tinea cruris: Secondary | ICD-10-CM

## 2013-07-21 MED ORDER — TERBINAFINE HCL 1 % EX CREA: 1.0000 "application " | TOPICAL_CREAM | Freq: Two times a day (BID) | CUTANEOUS | Status: DC

## 2013-07-21 NOTE — ED Provider Notes (Signed)
CSN: [id number]     Arrival date & time 07/21/13  1454 History   First MD Initiated Contact with Patient 07/21/13 1647     Chief Complaint  Patient presents with  . Rash   (Consider location/radiation/quality/duration/timing/severity/associated sxs/prior Treatment) Patient is a 28 y.o. female presenting with rash. The history is provided by the patient and the spouse.  Rash Location:  Ano-genital Ano-genital rash location:  Vulva Quality: itchiness   Severity:  Mild Onset quality:  Gradual Duration:  6 months Progression:  Unchanged Chronicity:  Chronic Context comment:  Husband had jock rash. Ineffective treatments:  Anti-itch cream   Past Medical History  Diagnosis Date  . Hypothyroidism, acquired, autoimmune   . Thyroiditis, autoimmune   . Thyrotoxicosis with diffuse goiter   . Exophthalmos   . Graves disease   . Graves' disease    History reviewed. No pertinent past surgical history. Family History  Problem Relation Age of Onset  . Cancer Maternal Aunt   . Thyroid disease Son    History  Substance Use Topics  . Smoking status: Never Smoker   . Smokeless tobacco: Not on file  . Alcohol Use: Yes     Comment: ocassionally   OB History   Grav Para Term Preterm Abortions TAB SAB Ect Mult Living                 Review of Systems  Constitutional: Negative.   Genitourinary: Negative for vaginal discharge and vaginal pain.  Musculoskeletal: Negative.   Skin: Positive for rash.    Allergies  Review of patient's allergies indicates no known allergies.  Home Medications   Current Outpatient Rx  Name  Route  Sig  Dispense  Refill  . levothyroxine (SYNTHROID) 175 MCG tablet   Oral   Take 1 tablet (175 mcg total) by mouth daily before breakfast.   30 tablet   6   . Multiple Vitamin (MULTIVITAMIN) tablet   Oral   Take 1 tablet by mouth daily.           . ranitidine (ZANTAC) 150 MG tablet   Oral   Take 1 tablet (150 mg total) by mouth 2 (two) times  daily.   60 tablet   6   . terbinafine (LAMISIL) 1 % cream   Topical   Apply 1 application topically 2 (two) times daily.   36 g   1    BP 139/85  Pulse 89  Temp(Src) 98.6 F (37 C) (Oral)  Resp 16  SpO2 100%  LMP 06/24/2013 Physical Exam  Nursing note and vitals reviewed. Constitutional: She is oriented to person, place, and time. She appears well-developed and well-nourished.  Genitourinary: Vagina normal.    There is rash on the right labia. There is no tenderness, lesion or injury on the right labia. There is rash on the left labia. There is no tenderness, lesion or injury on the left labia. No vaginal discharge found.  Neurological: She is alert and oriented to person, place, and time.  Skin: Skin is warm and dry.    ED Course  Procedures (including critical care time) Labs Review Labs Reviewed - No data to display Imaging Review No results found.  EKG Interpretation    Date/Time:    Ventricular Rate:    PR Interval:    QRS Duration:   QT Interval:    QTC Calculation:   R Axis:     Text Interpretation:  MDM      Linna HoffJames D Plato Alspaugh, MD 07/21/13 [phone]

## 2013-07-21 NOTE — Discharge Instructions (Signed)
Use medicine as prescribed if rash returns.

## 2013-07-21 NOTE — ED Notes (Signed)
Pt  Has   Rash  On  Her  Feet  As  Well  As    On  Vaginal  Area      X  6 months     She  Reports  Her  Feet  Are  Dry  And  Itch   She  denys  Any  Vaginal  Discharge  Or  And  Bleeding            She  Appears  In no  Acute   Distress

## 2013-08-05 LAB — T3, FREE: T3, Free: 3.4 pg/mL (ref 2.3–4.2)

## 2013-08-05 LAB — TSH: TSH: 0.824 u[IU]/mL (ref 0.350–4.500)

## 2013-08-05 LAB — T4, FREE: Free T4: 1.26 ng/dL (ref 0.80–1.80)

## 2013-08-07 ENCOUNTER — Ambulatory Visit (INDEPENDENT_AMBULATORY_CARE_PROVIDER_SITE_OTHER): Payer: Medicaid Other | Admitting: "Endocrinology

## 2013-08-07 ENCOUNTER — Encounter: Payer: Self-pay | Admitting: "Endocrinology

## 2013-08-07 VITALS — BP 109/58 | HR 76 | Wt 153.7 lb

## 2013-08-07 DIAGNOSIS — E063 Autoimmune thyroiditis: Secondary | ICD-10-CM

## 2013-08-07 DIAGNOSIS — E349 Endocrine disorder, unspecified: Secondary | ICD-10-CM

## 2013-08-07 DIAGNOSIS — E038 Other specified hypothyroidism: Secondary | ICD-10-CM

## 2013-08-07 DIAGNOSIS — E0500 Thyrotoxicosis with diffuse goiter without thyrotoxic crisis or storm: Secondary | ICD-10-CM

## 2013-08-07 DIAGNOSIS — R5381 Other malaise: Secondary | ICD-10-CM

## 2013-08-07 DIAGNOSIS — R5383 Other fatigue: Secondary | ICD-10-CM

## 2013-08-07 DIAGNOSIS — D649 Anemia, unspecified: Secondary | ICD-10-CM

## 2013-08-07 DIAGNOSIS — E049 Nontoxic goiter, unspecified: Secondary | ICD-10-CM

## 2013-08-07 NOTE — Progress Notes (Signed)
Subjective:  Patient Name: Grace Wall Date of Birth: 1986/01/03  MRN: [id number]  Grace Wall  presents to the office today for follow-up of her hypothyroidism, Hashimoto's disease, goiter and exophthalmos  HISTORY OF PRESENT ILLNESS:   Grace Wall is a 28 y.o. [place] young woman. Grace Wall was accompanied by her son and daughter.   1. The patient was first referred to me by herself on 05/08/05.   A. She was diagnosed with thyroid problems at age 73. At that point she was hyperthyroid, had a big anterior neck, and had big eyes, so she presumably had Graves' disease. She was placed on Tapazole ((methimazole). Tapazole was subsequently stopped at about age 34.  B. About one year later, she became hypothyroid, presumably due to coexistent Hashimoto's disease. She started Synthroid. The Synthroid was changed to generic levothyroxine in 2005. The patient's eyes had gradually improved over time. At that first visit with me, she was feeling fairly well, but was tired a lot. She was also somewhat mentally confused at times.   C. On physical examination she had intermittent proptosis bilaterally. The thyroid gland was 20+ grams in size. The right lobe was within normal limits, but the left lobe was slightly enlarged. Laboratory data showed a TSH of 3.09, free T4 1.22, and free T3 of 2.6. Her TPO antibody level was 1171.1, with normals being less than 60. A thyroid-stimulating immunoglobulin level was 64, with normals being <129. The thyroid binding inhibitory immunoglobulin was 96, with normals being less than 17. Her TSH receptor antibody was 87, with normals being less than 10. 2. Given the high levels of the thyroid receptor antibody, it was unclear how her thyroid tests would evolve. Repeat thyroid hormone tests performed on 05/18/2005 were markedly different. At this time the TSH was 0.032,  free T4 was 1.77, and free T3 was 3.9. At that point it was still unclear whether the fluctuations in thyroid hormone  were due to changes in the forms of levothyroxine made by different companies, or to  changes in thyroid receptor antibody levels, or both. I changed her to brand Synthroid at a dose of 150 mcg per day.   2. During the past 8 years we've had to adjust her thyroid hormone doses frequently. Her TSH values have ranged from 0.04 - 8.58. Part of these fluctuations have been due to flare ups of Hashimoto's disease. In addition, because she has not had any health insurance during most of this time, she's often had to rely on thyroid hormone samples. Sometimes she's not always had exactly the right thyroid hormone doses. She also still appears to have fluctuations in her thyroid receptor antibodies which have caused changes in her thyroid hormone levels.  Because of her lack of insurance, however, we not been able to repeat her thyroid receptor antibody testing.   3. The patient's last [facility] visit was on 02/01/13. In the interim, she has been fairly healthy. Her current Synthroid dose is 175 mcg 6 days per week, but only 1/2 pill on Sundays. She never started ranitidine because she was feeling better.   4. Pertinent Review of Systems:  Constitutional: The patient feels "pretty good". She is no longer tired a lot. She feels that she sleeps good. She does not snore. The stomach pains and nausea have resolved after stopping ibuprofen. She no longer has dizziness with standing.   Eyes: Vision is good. There are no significant eye complaints. Her right eye is always very slightly more prominent than her left Neck:  The patient has no complaints of anterior neck swelling, soreness, tenderness,  pressure, discomfort, or difficulty swallowing.  Heart: Heart rate increases with exercise or other physical activity. The patient has no complaints of palpitations, irregular heat beats, chest pain, or chest pressure. Gastrointestinal: Bowel movents seem normal. The patient has no complaints of reflux, upset stomach, stomach  aches, diarrhea or constipation. Legs: Muscle mass and strength seem normal. There are no complaints of numbness, tingling, burning, or pain. No edema is noted. Feet: There are no obvious foot problems. There are no complaints of numbness, tingling, burning, or pain. No edema is noted. GYN/GU: Her LMP was about December 10th. Her periods have been fairly regular.   PAST MEDICAL, FAMILY, AND SOCIAL HISTORY:  Past Medical History  Diagnosis Date  . Hypothyroidism, acquired, autoimmune   . Thyroiditis, autoimmune   . Thyrotoxicosis with diffuse goiter   . Exophthalmos   . Graves disease   . Graves' disease     Family History  Problem Relation Age of Onset  . Cancer Maternal Aunt   . Thyroid disease Son     Current outpatient prescriptions:levothyroxine (SYNTHROID) 175 MCG tablet, Take 1 tablet (175 mcg total) by mouth daily before breakfast., Disp: 30 tablet, Rfl: 6;  Multiple Vitamin (MULTIVITAMIN) tablet, Take 1 tablet by mouth daily.  , Disp: , Rfl: ;  ranitidine (ZANTAC) 150 MG tablet, Take 1 tablet (150 mg total) by mouth 2 (two) times daily., Disp: 60 tablet, Rfl: 6 terbinafine (LAMISIL) 1 % cream, Apply 1 application topically 2 (two) times daily., Disp: 36 g, Rfl: 1  Allergies as of 08/07/2013  . (No Known Allergies)    1. 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 2. Activities: She is no longer enrolled in the [employer] program. Her current limited Medicaid insurance will cover the cost of clinical visits with me and certain laboratory tests at no additional cost. She is getting Synthroid through that company's patient assistance program.  3. Smoking, alcohol, or drugs: None 4. Primary Care Provider: None.   REVIEW OF SYSTEMS: There are no other significant problems involving the patient's other body systems.   Objective:  Vital Signs:  BP 109/58  Pulse 76  Wt 153 lb 11.2 oz (69.718 kg)  LMP 06/24/2013  Wt Readings  from Last 3 Encounters:  08/07/13 153 lb 11.2 oz (69.718 kg)  02/01/13 154 lb 1.6 oz (69.899 kg)  08/03/12 150 lb (68.04 kg)   PHYSICAL EXAM: Constitutional: The patient appears healthy and well nourished.  Eyes:  There is no obvious arcus. The right eye is normal today, but slightly more prominent than the left. The left eye is also normal. She has no inferior proptosis of either eye today. Extraocular movements of her eyes are normal. She feels no sense of eye muscle pressure when looking upward and left or looking upward and right. Moisture appears normal. Mouth: The oropharynx and tongue appear normal. Oral moisture is normal. There is no hyperpigmentation.  Neck: The neck appears to be visibly normal. No carotid bruits are noted. The thyroid gland is slightly larger at about 20+ grams in size. The consistency of the thyroid gland is normal. The thyroid gland is not tender to palpation. Lungs: The lungs are clear to auscultation. Air movement is good. Heart: Heart rate and rhythm are regular. Heart sounds S1 and S2 are normal. I did not appreciate any pathologic cardiac murmurs. Abdomen: The abdomen is mildly enlarged. Bowel  sounds are normal. There is no obvious hepatomegaly, splenomegaly, or other mass effect.  Arms: Muscle size and bulk are normal for age. Hands: There is no obvious tremor. Phalangeal and metacarpophalangeal joints are normal. Palmar muscles are normal for age. Palmar skin is normal. Palmar moisture is also normal. There is no hyperpigmentation.  Legs: Muscles appear normal for age. No edema is present. Neurologic: Strength is normal for age in both the upper and lower extremities. Muscle tone is normal. Sensation to touch is normal in both legs.    LAB DATA:  08/04/12: TSH 0.824, free T4 1.26, free T3 3.4 02/01/13: Iron 84, Hgb 11.8, Hct 34.8% 01/30/13; TSH 1.397, free T4 1.44, free T3 2.8 08/02/12: TSH 3.078, free T4 1.14, free T3 2.7 12/15/11: TSH was 0.520, free T4  1.40, free T3  2.7   Assessment and Plan:   ASSESSMENT: 1. Hypothyroid/hyperthyroid: The patient is euthyroid now on her current Synthroid regimen. 2. Thyroiditis: Her Hashimoto's disease is clinically quiescent. The bouncing of her TFTs and the increase in thyroid gland size both indicate that she has had a relatively recent episode of Hashimoto's thyroiditis.  3. Goiter: Her thyroid gland is slightly larger today. The waxing and waning of thyroid gland size is c/w evolving thyroiditis.  4. Exophthalmos: The eyes are normal today. If I did not know she had had Graves' eye disease, I would not be able to diagnose it now. Her exophthalmos due to Graves' disease has essentially resolved.  5. Overweight: She has lost one pound.   6. Fatigue: Her fatigue sis much improved. Her fatigue was likely due to anemia.  7. Hypotension/anemia: Her dizziness with standing has resolved. The hypotension was probably due in part to relative dehydration and somewhat to anemia. She admittedly doesn't drink a lot of fluids. Her iron in July was quite normal, but she was mildly anemic. She has been taking One-A-Day MVI with iron ever since. .  8. Nausea and dyspepsia: Resolved   PLAN: 1. Diagnostic: Repeat her thyroid tests, CBC, and iron in 6 months.  2. Therapeutic: Continue Synthroid doses of  175 mcg pills 6 days per week, but only 1/2 pill on Sundays.  3. Patient education: We discussed the fact that we may have to adjust her thyroid dose doses every 6-12 months until she has lost all of the thyroid cells she is going to lose, whenever that occurs. . 4. Follow-up: 6 months  Level of Service: This visit lasted in excess of 40 minutes. More than 50% of the visit was devoted to counseling.  David Stall

## 2013-08-07 NOTE — Patient Instructions (Signed)
Follow up visit in 6 months. 

## 2013-09-27 ENCOUNTER — Other Ambulatory Visit: Payer: Self-pay | Admitting: *Deleted

## 2013-09-27 DIAGNOSIS — E038 Other specified hypothyroidism: Secondary | ICD-10-CM

## 2013-09-27 MED ORDER — LEVOTHYROXINE SODIUM 175 MCG PO TABS: 175.0000 ug | ORAL_TABLET | Freq: Every day | ORAL | Status: DC

## 2013-10-05 ENCOUNTER — Other Ambulatory Visit: Payer: Self-pay | Admitting: *Deleted

## 2013-10-05 DIAGNOSIS — E038 Other specified hypothyroidism: Secondary | ICD-10-CM

## 2014-02-05 ENCOUNTER — Ambulatory Visit: Payer: Medicaid Other | Admitting: "Endocrinology

## 2014-02-20 ENCOUNTER — Other Ambulatory Visit: Payer: Self-pay | Admitting: *Deleted

## 2014-02-20 DIAGNOSIS — E038 Other specified hypothyroidism: Secondary | ICD-10-CM

## 2014-02-20 MED ORDER — LEVOTHYROXINE SODIUM 175 MCG PO TABS: 175.0000 ug | ORAL_TABLET | Freq: Every day | ORAL | Status: DC

## 2014-03-27 ENCOUNTER — Other Ambulatory Visit: Payer: Self-pay | Admitting: *Deleted

## 2014-03-27 ENCOUNTER — Telehealth: Payer: Self-pay | Admitting: "Endocrinology

## 2014-03-27 DIAGNOSIS — E038 Other specified hypothyroidism: Secondary | ICD-10-CM

## 2014-03-27 NOTE — Telephone Encounter (Signed)
Labs added as per Dr. Fransico Michael orders. LI

## 2014-04-13 LAB — CBC WITH DIFFERENTIAL/PLATELET
Basophils Absolute: 0 10*3/uL (ref 0.0–0.1)
Basophils Relative: 0 % (ref 0–1)
EOS ABS: 0 10*3/uL (ref 0.0–0.7)
Eosinophils Relative: 0 % (ref 0–5)
HCT: 32.6 % — ABNORMAL LOW (ref 36.0–46.0)
HEMOGLOBIN: 11.1 g/dL — AB (ref 12.0–15.0)
Lymphocytes Relative: 26 % (ref 12–46)
Lymphs Abs: 1.9 10*3/uL (ref 0.7–4.0)
MCH: 25.5 pg — AB (ref 26.0–34.0)
MCHC: 34 g/dL (ref 30.0–36.0)
MCV: 74.9 fL — AB (ref 78.0–100.0)
MONOS PCT: 8 % (ref 3–12)
Monocytes Absolute: 0.6 10*3/uL (ref 0.1–1.0)
NEUTROS PCT: 66 % (ref 43–77)
Neutro Abs: 4.8 10*3/uL (ref 1.7–7.7)
Platelets: 180 10*3/uL (ref 150–400)
RBC: 4.35 MIL/uL (ref 3.87–5.11)
RDW: 14.5 % (ref 11.5–15.5)
WBC: 7.2 10*3/uL (ref 4.0–10.5)

## 2014-04-13 LAB — IRON: Iron: 24 ug/dL — ABNORMAL LOW (ref 42–145)

## 2014-04-13 LAB — T4, FREE: Free T4: 1.45 ng/dL (ref 0.80–1.80)

## 2014-04-13 LAB — T3, FREE: T3, Free: 3.3 pg/mL (ref 2.3–4.2)

## 2014-04-13 LAB — TSH: TSH: 0.122 u[IU]/mL — ABNORMAL LOW (ref 0.350–4.500)

## 2014-04-17 ENCOUNTER — Ambulatory Visit (INDEPENDENT_AMBULATORY_CARE_PROVIDER_SITE_OTHER): Payer: No Typology Code available for payment source | Admitting: "Endocrinology

## 2014-04-17 ENCOUNTER — Encounter: Payer: Self-pay | Admitting: "Endocrinology

## 2014-04-17 VITALS — BP 113/64 | HR 87 | Wt 160.6 lb

## 2014-04-17 DIAGNOSIS — E038 Other specified hypothyroidism: Secondary | ICD-10-CM

## 2014-04-17 DIAGNOSIS — E0500 Thyrotoxicosis with diffuse goiter without thyrotoxic crisis or storm: Secondary | ICD-10-CM

## 2014-04-17 DIAGNOSIS — E063 Autoimmune thyroiditis: Secondary | ICD-10-CM

## 2014-04-17 DIAGNOSIS — E349 Endocrine disorder, unspecified: Secondary | ICD-10-CM

## 2014-04-17 DIAGNOSIS — E049 Nontoxic goiter, unspecified: Secondary | ICD-10-CM

## 2014-04-17 DIAGNOSIS — D509 Iron deficiency anemia, unspecified: Secondary | ICD-10-CM | POA: Insufficient documentation

## 2014-04-17 DIAGNOSIS — R5381 Other malaise: Secondary | ICD-10-CM

## 2014-04-17 DIAGNOSIS — R5383 Other fatigue: Secondary | ICD-10-CM

## 2014-04-17 DIAGNOSIS — E663 Overweight: Secondary | ICD-10-CM

## 2014-04-17 MED ORDER — LEVOTHYROXINE SODIUM 175 MCG PO TABS: 175.0000 ug | ORAL_TABLET | Freq: Every day | ORAL | Status: DC

## 2014-04-17 NOTE — Patient Instructions (Signed)
Follow up visit in 6 months. Please repeat lab test in 3 months and in 6 months.

## 2014-04-17 NOTE — Progress Notes (Signed)
Subjective:  Patient Name: Grace Wall Date of Birth: 1985/10/04  MRN: [id number]  Grace Wall  presents to the office today for follow-up of her hypothyroidism, Hashimoto's disease, goiter and exophthalmos  HISTORY OF PRESENT ILLNESS:   Grace Wall is a 28 y.o. [place] young woman. Grace Wall was unaccompanied.   1. The patient was first referred to me by herself on 05/08/05.   A. She was diagnosed with thyroid problems at age 32. At that point she was hyperthyroid, had a big anterior neck, and had big eyes, so she presumably had Graves' disease. She was placed on Tapazole ((methimazole). Tapazole was subsequently stopped at about age 9.  B. About one year later, she became hypothyroid, presumably due to coexistent Hashimoto's disease. She started Synthroid. The Synthroid was changed to generic levothyroxine in 2005. The patient's eyes had gradually improved over time. At that first visit with me, she was feeling fairly well, but was tired a lot. She was also somewhat mentally confused at times.   C. On physical examination she had intermittent proptosis bilaterally. The thyroid gland was 20+ grams in size. The right lobe was within normal limits, but the left lobe was slightly enlarged. Laboratory data showed a TSH of 3.09, free T4 1.22, and free T3 of 2.6. Her TPO antibody level was 1171.1, with normals being less than 60. A thyroid-stimulating immunoglobulin level was 64, with normals being <129. The thyroid binding inhibitory immunoglobulin was 96, with normals being less than 17. Her TSH receptor antibody was 87, with normals being less than 10. 2. Given the high levels of the thyroid receptor antibody, it was unclear how her thyroid tests would evolve. Repeat thyroid hormone tests performed on 05/18/2005 were markedly different. At this time the TSH was 0.032,  free T4 was 1.77, and free T3 was 3.9. At that point it was still unclear whether the fluctuations in thyroid hormone were due to changes in  the forms of levothyroxine made by different companies, or to  changes in thyroid receptor antibody levels, or both. I changed her to brand Synthroid at a dose of 150 mcg per day.   2. During the past 9 years we've had to adjust her thyroid hormone doses frequently. Her TSH values have ranged from 0.04 - 8.58. Part of these fluctuations have been due to flare ups of Hashimoto's disease. In addition, because she has not had any health insurance during most of this time, she's often had to rely on thyroid hormone samples. Sometimes she's not always had exactly the right thyroid hormone doses. She also still appears to have fluctuations in her thyroid receptor antibodies which have caused changes in her thyroid hormone levels.  Because of her lack of insurance, however, we have not been able to repeat her thyroid receptor antibody testing.   3. The patient's last [facility] visit was on 08/07/13. In the interim, she has been fairly healthy. She changed to generic LT4 one month ago due to insurance issues. She still takes 175 mcg/day 6 days per week and 1/2 pill on Sundays, but is more tired, feels colder, and worries that the generic is not working well.   4. Pertinent Review of Systems:  Constitutional: The patient feels "tired and cold".  She feels that she sleeps good. She does not snore.  Eyes: Vision is pretty good, but she sometimes needs her glasses for driving. There are other no significant eye complaints. Her right eye is always very slightly more prominent than her left Neck: The patient  has no complaints of anterior neck swelling, soreness, tenderness,  pressure, discomfort, or difficulty swallowing.  Heart: Heart rate increases with exercise or other physical activity. The patient has no complaints of palpitations, irregular heat beats, chest pain, or chest pressure. Gastrointestinal: She is more frequently constipated since starting on generic LT4.. The patient has no complaints of reflux, upset  stomach, stomach aches, or diarrhea. Legs: Muscle mass and strength seem normal. There are no complaints of numbness, tingling, burning, or pain. No edema is noted. Feet: There are no obvious foot problems. There are no complaints of numbness, tingling, burning, or pain. No edema is noted. GYN Her LMP was about September 9th. Her periods have been fairly regular.   PAST MEDICAL, FAMILY, AND SOCIAL HISTORY:  Past Medical History  Diagnosis Date  . Hypothyroidism, acquired, autoimmune   . Thyroiditis, autoimmune   . Thyrotoxicosis with diffuse goiter   . Exophthalmos   . Graves disease   . Graves' disease     Family History  Problem Relation Age of Onset  . Cancer Maternal Aunt   . Thyroid disease Son     Current outpatient prescriptions:levothyroxine (SYNTHROID, LEVOTHROID) 175 MCG tablet, Take 1 tablet (175 mcg total) by mouth daily before breakfast., Disp: 30 tablet, Rfl: 6;  Multiple Vitamin (MULTIVITAMIN) tablet, Take 1 tablet by mouth daily.  , Disp: , Rfl: ;  ranitidine (ZANTAC) 150 MG tablet, Take 1 tablet (150 mg total) by mouth 2 (two) times daily., Disp: 60 tablet, Rfl: 6 terbinafine (LAMISIL) 1 % cream, Apply 1 application topically 2 (two) times daily., Disp: 36 g, Rfl: 1  Allergies as of 04/17/2014  . (No Known Allergies)    1. 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 2. Activities: She is on "Family Planning Medicaid".  3. Smoking, alcohol, or drugs: None 4. Primary Care Provider: None.   REVIEW OF SYSTEMS: There are no other significant problems involving the patient's other body systems.   Objective:  Vital Signs:  BP 113/64  Pulse 87  Wt 160 lb 9.6 oz (72.848 kg)  Wt Readings from Last 3 Encounters:  04/17/14 160 lb 9.6 oz (72.848 kg)  08/07/13 153 lb 11.2 oz (69.718 kg)  02/01/13 154 lb 1.6 oz (69.899 kg)   PHYSICAL EXAM: Constitutional: The patient appears healthy and well nourished. She has gained 7 pounds  in 8 months.  Eyes:  There is no obvious arcus. The eyes are normal today. She has no inferior proptosis of either eye today. Extraocular movements of her eyes are normal. She feels no sense of eye muscle pressure when looking upward and left or looking upward and right. Moisture appears normal. Mouth: The oropharynx and tongue appear normal. Oral moisture is normal. There is no hyperpigmentation.  Neck: The neck appears to be visibly normal. No carotid bruits are noted. The thyroid gland is smaller at 18-20 grams in size. The consistency of the thyroid gland is normal. The thyroid gland is not tender to palpation. Lungs: The lungs are clear to auscultation. Air movement is good. Heart: Heart rate and rhythm are regular. Heart sounds S1 and S2 are normal. I did not appreciate any pathologic cardiac murmurs. Abdomen: The abdomen is more enlarged. Bowel sounds are normal. There is no obvious hepatomegaly, splenomegaly, or other mass effect.  Arms: Muscle size and bulk are normal for age. Hands: There is no obvious tremor. Phalangeal and metacarpophalangeal joints are normal. Palmar muscles are normal for age. Palmar  skin is normal. Palmar moisture is also normal. There is no hyperpigmentation.  Legs: Muscles appear normal for age. No edema is present. Neurologic: Strength is normal for age in both the upper and lower extremities. Muscle tone is normal. Sensation to touch is normal in both legs.    LAB DATA:   04/13/14: Iron 24; Hgb 11.1, Hct 32.6%, MCV 74.9; TSH 0.122, free T4 1.45, free T3 3.3  08/04/12: TSH 0.824, free T4 1.26, free T3 3.4  02/01/13: Iron 84, Hgb 11.8, Hct 34.8%  01/30/13; TSH 1.397, free T4 1.44, free T3 2.8  08/02/12: TSH 3.078, free T4 1.14, free T3 2.7  12/15/11: TSH was 0.520, free T4 1.40, free T3  2.7   Assessment and Plan:   ASSESSMENT: 1. Hypothyroid/hyperthyroid: The patient is hyperthyroid on her current dose of generic LT4. We have two choices. We can reduce the  dose of the generic or put her back on brand Synthroid. She prefers the latter option.  2. Thyroiditis: Her Hashimoto's disease is clinically quiescent. The bouncing of her TFTs and the waxing and waning of thyroid gland size are c/w evolving Hashimoto's thyroiditis.  3. Goiter: Her thyroid gland is smaller today. The waxing and waning of thyroid gland size is c/w evolving thyroiditis.  4. Exophthalmos: The eyes are normal today. If I did not know she had had Graves' eye disease, I would not be able to diagnose it now. Her exophthalmos due to Graves' disease has resolved.  5. Overweight: She has gained more weight. She is not as physically active as she needs to be.    6. Fatigue/coldness: Her fatigue and sensation of bring cold are worse, largely due to iron deficiency anemia.   7. Hypotension: Her BP is good. Her dizziness has resolved. When she took One-a-Day MVI with iron before she felt well overall.   PLAN: 1. Diagnostic: Repeat her thyroid tests, CBC, and iron in 3 months and 6 months.  2. Therapeutic: Resume Synthroid doses of  175 mcg pills 6 days per week, but only 1/2 pill on Sundays.  3. Patient education: We discussed the fact that we may have to adjust her thyroid dose doses every 6-12 months until she has lost all of the thyroid cells she is going to lose, whenever that occurs. For her the generic LT4 does not give her steady level of thyroid hormone.  4. Follow-up: 6 months  Level of Service: This visit lasted in excess of 40 minutes. More than 50% of the visit was devoted to counseling.  David Stall

## 2014-05-14 ENCOUNTER — Ambulatory Visit: Payer: No Typology Code available for payment source

## 2014-06-12 ENCOUNTER — Other Ambulatory Visit: Payer: Self-pay | Admitting: *Deleted

## 2014-06-12 DIAGNOSIS — E063 Autoimmune thyroiditis: Secondary | ICD-10-CM

## 2014-06-12 MED ORDER — LEVOTHYROXINE SODIUM 175 MCG PO TABS: 175.0000 ug | ORAL_TABLET | Freq: Every day | ORAL | Status: DC

## 2014-07-18 ENCOUNTER — Other Ambulatory Visit: Payer: Self-pay | Admitting: *Deleted

## 2014-07-18 DIAGNOSIS — E038 Other specified hypothyroidism: Secondary | ICD-10-CM

## 2014-07-18 LAB — IRON: Iron: 60 ug/dL (ref 42–145)

## 2014-07-19 LAB — CBC WITH DIFFERENTIAL/PLATELET
Basophils Absolute: 0 10*3/uL (ref 0.0–0.1)
Basophils Relative: 0 % (ref 0–1)
EOS PCT: 1 % (ref 0–5)
Eosinophils Absolute: 0.1 10*3/uL (ref 0.0–0.7)
HCT: 34.4 % — ABNORMAL LOW (ref 36.0–46.0)
Hemoglobin: 10.9 g/dL — ABNORMAL LOW (ref 12.0–15.0)
LYMPHS ABS: 1.8 10*3/uL (ref 0.7–4.0)
LYMPHS PCT: 32 % (ref 12–46)
MCH: 24.6 pg — AB (ref 26.0–34.0)
MCHC: 31.7 g/dL (ref 30.0–36.0)
MCV: 77.7 fL — AB (ref 78.0–100.0)
MPV: 11.2 fL (ref 8.6–12.4)
Monocytes Absolute: 0.3 10*3/uL (ref 0.1–1.0)
Monocytes Relative: 5 % (ref 3–12)
Neutro Abs: 3.4 10*3/uL (ref 1.7–7.7)
Neutrophils Relative %: 62 % (ref 43–77)
Platelets: 174 10*3/uL (ref 150–400)
RBC: 4.43 MIL/uL (ref 3.87–5.11)
RDW: 15.2 % (ref 11.5–15.5)
WBC: 5.5 10*3/uL (ref 4.0–10.5)

## 2014-07-19 LAB — TSH: TSH: 0.381 u[IU]/mL (ref 0.350–4.500)

## 2014-07-19 LAB — T3, FREE: T3, Free: 2.9 pg/mL (ref 2.3–4.2)

## 2014-07-19 LAB — T4, FREE: Free T4: 1.38 ng/dL (ref 0.80–1.80)

## 2014-07-24 ENCOUNTER — Telehealth: Payer: Self-pay | Admitting: *Deleted

## 2014-07-24 NOTE — Telephone Encounter (Signed)
Spoke to patient advised that per Dr. Fransico MichaelBrennan  TFTs are still mildly hyperthyroid. If she is taking the Synthroid doses of 175 mcg/day six days a week and 1/2 pill on Sunday, please change the dose to 175 mcg/day on five days per week and 1/2 pill on two days per week. If she is already taking the latter dose, please reduce the Synthroid dose further to 175 mcg/day on four days per week and 1/2 pill on the other three days per week.     Pt advises she is taking 175 6x weekly and 1/2 on sundays. I advised for her to take 175 5 days a week and 1/2 2 days.

## 2014-09-24 ENCOUNTER — Other Ambulatory Visit: Payer: Self-pay | Admitting: *Deleted

## 2014-09-24 DIAGNOSIS — E034 Atrophy of thyroid (acquired): Secondary | ICD-10-CM

## 2014-10-15 LAB — CBC WITH DIFFERENTIAL/PLATELET
BASOS PCT: 0 % (ref 0–1)
Basophils Absolute: 0 10*3/uL (ref 0.0–0.1)
EOS ABS: 0 10*3/uL (ref 0.0–0.7)
EOS PCT: 1 % (ref 0–5)
HEMATOCRIT: 35.6 % — AB (ref 36.0–46.0)
HEMOGLOBIN: 11.3 g/dL — AB (ref 12.0–15.0)
LYMPHS ABS: 1.8 10*3/uL (ref 0.7–4.0)
Lymphocytes Relative: 39 % (ref 12–46)
MCH: 25.6 pg — AB (ref 26.0–34.0)
MCHC: 31.7 g/dL (ref 30.0–36.0)
MCV: 80.5 fL (ref 78.0–100.0)
MONOS PCT: 5 % (ref 3–12)
MPV: 10.6 fL (ref 8.6–12.4)
Monocytes Absolute: 0.2 10*3/uL (ref 0.1–1.0)
Neutro Abs: 2.5 10*3/uL (ref 1.7–7.7)
Neutrophils Relative %: 55 % (ref 43–77)
Platelets: 159 10*3/uL (ref 150–400)
RBC: 4.42 MIL/uL (ref 3.87–5.11)
RDW: 16.7 % — AB (ref 11.5–15.5)
WBC: 4.6 10*3/uL (ref 4.0–10.5)

## 2014-10-16 ENCOUNTER — Encounter: Payer: Self-pay | Admitting: "Endocrinology

## 2014-10-16 ENCOUNTER — Ambulatory Visit (INDEPENDENT_AMBULATORY_CARE_PROVIDER_SITE_OTHER): Payer: Medicaid Other | Admitting: "Endocrinology

## 2014-10-16 ENCOUNTER — Other Ambulatory Visit: Payer: Self-pay | Admitting: *Deleted

## 2014-10-16 VITALS — BP 115/57 | HR 71 | Wt 157.0 lb

## 2014-10-16 DIAGNOSIS — R5383 Other fatigue: Secondary | ICD-10-CM

## 2014-10-16 DIAGNOSIS — E038 Other specified hypothyroidism: Secondary | ICD-10-CM

## 2014-10-16 DIAGNOSIS — D509 Iron deficiency anemia, unspecified: Secondary | ICD-10-CM

## 2014-10-16 DIAGNOSIS — E349 Endocrine disorder, unspecified: Secondary | ICD-10-CM

## 2014-10-16 DIAGNOSIS — R6889 Other general symptoms and signs: Secondary | ICD-10-CM

## 2014-10-16 DIAGNOSIS — E063 Autoimmune thyroiditis: Secondary | ICD-10-CM

## 2014-10-16 DIAGNOSIS — E663 Overweight: Secondary | ICD-10-CM

## 2014-10-16 DIAGNOSIS — E049 Nontoxic goiter, unspecified: Secondary | ICD-10-CM

## 2014-10-16 DIAGNOSIS — H0520 Unspecified exophthalmos: Secondary | ICD-10-CM

## 2014-10-16 LAB — T3, FREE: T3, Free: 2.4 pg/mL (ref 2.3–4.2)

## 2014-10-16 LAB — T4, FREE: Free T4: 0.99 ng/dL (ref 0.80–1.80)

## 2014-10-16 LAB — TSH: TSH: 2.549 u[IU]/mL (ref 0.350–4.500)

## 2014-10-16 LAB — IRON: IRON: 24 ug/dL — AB (ref 42–145)

## 2014-10-16 NOTE — Progress Notes (Signed)
Subjective:  Patient Name: Grace Wall Date of Birth: [birth year]  MRN: [id number]  Grace Wall  presents to the office today for follow-up of her hypothyroidism, Hashimoto's disease, goiter and exophthalmos  HISTORY OF PRESENT ILLNESS:   Grace Wall is a 29 y.o. [place] young woman. Grace Wall was unaccompanied.   1. The patient was first referred to me by herself on 05/08/05.   A. She was diagnosed with thyroid problems at age 55. At that point she was hyperthyroid, had a big anterior neck, and had big eyes, so she presumably had Graves' disease. She was placed on Tapazole ((methimazole). Tapazole was subsequently stopped at about age 78.  B. About one year later, she became hypothyroid, presumably due to coexistent Hashimoto's disease. She started Synthroid. The Synthroid was changed to generic levothyroxine in 2005. The patient's eyes had gradually improved over time. At that first visit with me, she was feeling fairly well, but was tired a lot. She was also somewhat mentally confused at times.   C. On physical examination she had intermittent proptosis bilaterally. The thyroid gland was 20+ grams in size. The right lobe was within normal limits, but the left lobe was slightly enlarged. Laboratory data showed a TSH of 3.09, free T4 1.22, and free T3 of 2.6. Her TPO antibody level was 1171.1, with normals being less than 60. A thyroid-stimulating immunoglobulin level was 64, with normals being <129. The thyroid binding inhibitory immunoglobulin was 96, with normals being less than 17. Her TSH receptor antibody was 87, with normals being less than 10. 2. Given the high levels of the thyroid receptor antibody, it was unclear how her thyroid tests would evolve. Repeat thyroid hormone tests performed on 05/18/2005 were markedly different. At this time the TSH was 0.032,  free T4 was 1.77, and free T3 was 3.9. At that point it was still unclear whether the fluctuations in thyroid hormone were due to changes in  the forms of levothyroxine made by different companies, or to  changes in thyroid receptor antibody levels, or both. I changed her to brand Synthroid at a dose of 150 mcg per day.   2. During the past 9 years we've had to adjust her thyroid hormone doses frequently. Her TSH values have ranged from 0.04 - 8.58. Part of these fluctuations have been due to flare ups of Hashimoto's disease. In addition, because she has not had any health insurance during most of this time, she's often had to rely on thyroid hormone samples. Sometimes she's not always had exactly the right thyroid hormone doses. She also appears to have had fluctuations in her thyroid receptor antibodies which have caused changes in her thyroid hormone levels.  Because of her lack of insurance, however, we had not been able to repeat her thyroid receptor antibody testing.   3. The patient's last [facility] visit was on 04/17/14. In the interim, she has been healthy. She is taking Synthroid  175 mcg tablets, one tablet per day on weekdays, but only 1/2 tablet per day on Saturdays and Sundays. She feels much better since resuming brand Synthroid. She takes her Synthroid in the mornings, the same time that she takes her One-a-Day MVI for women.   4. Pertinent Review of Systems:  Constitutional: The patient feels "good, but I'm losing a lot of hair every time I shower" . Her fatigue is better. She is not unusually cold anymore.She feels that she sleeps well.  Eyes: Vision is pretty good, but she sometimes needs her glasses for reading.  There are no other significant eye complaints. Her right eye is always very slightly more prominent than her left Neck: The patient has no complaints of anterior neck swelling, soreness, tenderness,  pressure, discomfort, or difficulty swallowing.  Heart: Heart rate increases with exercise or other physical activity. The patient has no complaints of palpitations, irregular heat beats, chest pain, or chest  pressure. Gastrointestinal: BMs have been fairly normal. The patient has no complaints of reflux, upset stomach, stomach aches, diarrhea, or constipation. Legs: Muscle mass and strength seem normal. There are no complaints of numbness, tingling, burning, or pain. No edema is noted. Feet: There are no obvious foot problems. There are no complaints of numbness, tingling, burning, or pain. No edema is noted. GYN Her LMP was March 6th. Her periods have been regular.   PAST MEDICAL, FAMILY, AND SOCIAL HISTORY:  Past Medical History  Diagnosis Date  . Hypothyroidism, acquired, autoimmune   . Thyroiditis, autoimmune   . Thyrotoxicosis with diffuse goiter   . Exophthalmos   . Graves disease   . Graves' disease     Family History  Problem Relation Age of Onset  . Cancer Maternal Aunt   . Thyroid disease Son      Current outpatient prescriptions:  .  levothyroxine (SYNTHROID) 175 MCG tablet, Take 1 tablet (175 mcg total) by mouth daily before breakfast., Disp: 30 tablet, Rfl: 12 .  Multiple Vitamin (MULTIVITAMIN) tablet, Take 1 tablet by mouth daily.  , Disp: , Rfl:  .  ranitidine (ZANTAC) 150 MG tablet, Take 1 tablet (150 mg total) by mouth 2 (two) times daily. (Patient not taking: Reported on 10/16/2014), Disp: 60 tablet, Rfl: 6 .  terbinafine (LAMISIL) 1 % cream, Apply 1 application topically 2 (two) times daily. (Patient not taking: Reported on 10/16/2014), Disp: 36 g, Rfl: 1  Allergies as of 10/16/2014  . (No Known Allergies)    1. 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 She is on "Family Planning Medicaid". 2. Activities: She stays busy with child care.  3. Smoking, alcohol, or drugs: None 4. Primary Care Provider: None.   REVIEW OF SYSTEMS: There are no other significant problems involving the patient's other body systems.   Objective:  Vital Signs:  BP 115/57 mmHg  Pulse 71  Wt 157 lb (71.215 kg)  Wt Readings from Last 3  Encounters:  10/16/14 157 lb (71.215 kg)  04/17/14 160 lb 9.6 oz (72.848 kg)  08/07/13 153 lb 11.2 oz (69.718 kg)   PHYSICAL EXAM: Constitutional: The patient appears healthy, but overweight/obese. She has lost 3 pounds in 6 months. She looks bright, alert, and happy. She smiles and  laughs. She no longer looks chronically tired.  Eyes:  There is no obvious arcus. The eyes are normal today. She has no inferior proptosis of either eye today. Extraocular movements of her eyes are normal. She feels no sense of eye muscle pressure when looking upward and left or looking upward and right. Moisture appears normal. Mouth: The oropharynx and tongue appear normal. Oral moisture is normal. There is no hyperpigmentation.  Neck: The neck appears to be visibly normal. No carotid bruits are noted. The thyroid gland is slightly larger at 20+ in size. The right lobe is normal, but the left lobe is mildly enlarged. The consistency of the thyroid gland is normal. The thyroid gland is not tender to palpation. Lungs: The lungs are clear to auscultation. Air movement is good. Heart: Heart rate and  rhythm are regular. Heart sounds S1 and S2 are normal. I did not appreciate any pathologic cardiac murmurs. Abdomen: The abdomen is enlarged. Bowel sounds are normal. There is no obvious hepatomegaly, splenomegaly, or other mass effect.  Arms: Muscle size and bulk are normal for age. Hands: There is no obvious tremor. Phalangeal and metacarpophalangeal joints are normal. Palmar muscles are normal for age. Palmar skin is normal. Palmar moisture is also normal. There is no hyperpigmentation.  Legs: Muscles appear normal for age. No edema is present. Neurologic: Strength is normal for age in both the upper and lower extremities. Muscle tone is normal. Sensation to touch is normal in both legs.    LAB DATA:   10/15/14: Iron 24, Hgb 11.3, Hct 35.6, MCH 25.6; TSH 2.549, free T4 0.99, free T3 2.4  04/13/14: Iron 24; Hgb 11.1, Hct  32.6%, MCV 74.9; TSH 0.122, free T4 1.45, free T3 3.3  08/04/12: TSH 0.824, free T4 1.26, free T3 3.4  02/01/13: Iron 84, Hgb 11.8, Hct 34.8%  01/30/13; TSH 1.397, free T4 1.44, free T3 2.8  08/02/12: TSH 3.078, free T4 1.14, free T3 2.7  12/15/11: TSH was 0.520, free T4 1.40, free T3  2.7   Assessment and Plan:   ASSESSMENT: 1. Hypothyroid/hyperthyroid: The patient is euthyroid at about the 20% of the normal range. Part of her decrease in free T4 and free T3 was due to reducing her Synthroid dose by 1/2 pill per week, but some was also likely due to complexing of her Synthroid with her MVI, resulting in less absorption of her Synthroid.  2. Thyroiditis: Her Hashimoto's disease is clinically quiescent.  3. Goiter: Her thyroid gland is larger today. The waxing and waning of thyroid gland size is c/w evolving thyroiditis.  4. Exophthalmos: The eyes are normal again today. Her exophthalmos due to Graves' disease has resolved.  5. Overweight: She has lost a few pounds. She is not as physically active as she needs to be.   6. Fatigue/coldness: Her fatigue and sensation of being cold have resolved on Synthroid. 7. Hypotension: Her BP is good. Her dizziness has resolved. When she took One-a-Day MVI with iron before she felt well overall.  8. Iron deficiency anemia: Despite treatment with her MVI once daily, her iron level has not increased, but her Hgb, Hct, and MCH have all improved and are close to the lower limit of normal. The fact that her iron is being actively incorporated into new RBC may explain why the iron level is still low. If she continues her MVI, however, the iron level will likely increase.  PLAN: 1. Diagnostic: Repeat her thyroid tests, CBC, and iron in 3 months and 6 months.  2. Therapeutic: Continue her current Synthroid doses of  175 mcg pills 5 days per week, but only 1/2 pill on Saturdays and Sundays. Change her MVI to one pill at dinner and one pill at bedtime.  3. Patient  education: We discussed the fact that we may have to adjust her thyroid dose doses every 6-12 months until she has lost all of the thyroid cells she is going to lose, whenever that occurs. We discussed the effects that calcium and iron have to decrease synthroid absorption. We discussed the need for her to remain on brand Synthroid. Generic LT4 does not give her steady level of thyroid hormone.  4. Follow-up: 6 months  Level of Service: This visit lasted in excess of 40 minutes. More than 50% of the visit was devoted to  counseling.  David Stall

## 2014-10-16 NOTE — Patient Instructions (Signed)
Follow up visit in 6 months. Please repeat your lab tests in 3 months and 6 months.

## 2014-10-26 ENCOUNTER — Ambulatory Visit: Payer: No Typology Code available for payment source

## 2015-01-10 ENCOUNTER — Other Ambulatory Visit: Payer: Self-pay | Admitting: *Deleted

## 2015-01-10 DIAGNOSIS — E034 Atrophy of thyroid (acquired): Secondary | ICD-10-CM

## 2015-01-10 LAB — CBC WITH DIFFERENTIAL/PLATELET
BASOS ABS: 0 10*3/uL (ref 0.0–0.1)
BASOS PCT: 0 % (ref 0–1)
EOS ABS: 0.1 10*3/uL (ref 0.0–0.7)
EOS PCT: 1 % (ref 0–5)
HCT: 36.3 % (ref 36.0–46.0)
HEMOGLOBIN: 11.9 g/dL — AB (ref 12.0–15.0)
LYMPHS ABS: 1.8 10*3/uL (ref 0.7–4.0)
Lymphocytes Relative: 33 % (ref 12–46)
MCH: 26.2 pg (ref 26.0–34.0)
MCHC: 32.8 g/dL (ref 30.0–36.0)
MCV: 80 fL (ref 78.0–100.0)
MPV: 10.6 fL (ref 8.6–12.4)
Monocytes Absolute: 0.3 10*3/uL (ref 0.1–1.0)
Monocytes Relative: 6 % (ref 3–12)
Neutro Abs: 3.4 10*3/uL (ref 1.7–7.7)
Neutrophils Relative %: 60 % (ref 43–77)
PLATELETS: 171 10*3/uL (ref 150–400)
RBC: 4.54 MIL/uL (ref 3.87–5.11)
RDW: 15.4 % (ref 11.5–15.5)
WBC: 5.6 10*3/uL (ref 4.0–10.5)

## 2015-01-11 LAB — IRON: IRON: 43 ug/dL (ref 42–145)

## 2015-01-11 LAB — TSH: TSH: 2.853 u[IU]/mL (ref 0.350–4.500)

## 2015-01-11 LAB — T3, FREE: T3, Free: 2.5 pg/mL (ref 2.3–4.2)

## 2015-01-11 LAB — HEMOGLOBIN A1C
Hgb A1c MFr Bld: 5.9 % — ABNORMAL HIGH (ref <5.7)
Mean Plasma Glucose: 123 mg/dL — ABNORMAL HIGH (ref <117)

## 2015-01-11 LAB — T4, FREE: Free T4: 0.83 ng/dL (ref 0.80–1.80)

## 2015-01-15 ENCOUNTER — Encounter: Payer: Self-pay | Admitting: *Deleted

## 2015-03-18 ENCOUNTER — Other Ambulatory Visit: Payer: Self-pay | Admitting: "Endocrinology

## 2015-04-15 ENCOUNTER — Other Ambulatory Visit: Payer: Self-pay | Admitting: *Deleted

## 2015-04-15 DIAGNOSIS — E034 Atrophy of thyroid (acquired): Secondary | ICD-10-CM

## 2015-04-17 LAB — CBC WITH DIFFERENTIAL/PLATELET
BASOS ABS: 0 10*3/uL (ref 0.0–0.1)
Basophils Relative: 0 % (ref 0–1)
EOS ABS: 0.1 10*3/uL (ref 0.0–0.7)
EOS PCT: 1 % (ref 0–5)
HEMATOCRIT: 35.7 % — AB (ref 36.0–46.0)
Hemoglobin: 11.7 g/dL — ABNORMAL LOW (ref 12.0–15.0)
LYMPHS ABS: 1.7 10*3/uL (ref 0.7–4.0)
LYMPHS PCT: 29 % (ref 12–46)
MCH: 26.6 pg (ref 26.0–34.0)
MCHC: 32.8 g/dL (ref 30.0–36.0)
MCV: 81.1 fL (ref 78.0–100.0)
MONOS PCT: 7 % (ref 3–12)
MPV: 11.6 fL (ref 8.6–12.4)
Monocytes Absolute: 0.4 10*3/uL (ref 0.1–1.0)
Neutro Abs: 3.7 10*3/uL (ref 1.7–7.7)
Neutrophils Relative %: 63 % (ref 43–77)
PLATELETS: 177 10*3/uL (ref 150–400)
RBC: 4.4 MIL/uL (ref 3.87–5.11)
RDW: 15.1 % (ref 11.5–15.5)
WBC: 5.9 10*3/uL (ref 4.0–10.5)

## 2015-04-17 LAB — IRON: Iron: 36 ug/dL — ABNORMAL LOW (ref 40–190)

## 2015-04-18 ENCOUNTER — Ambulatory Visit (INDEPENDENT_AMBULATORY_CARE_PROVIDER_SITE_OTHER): Payer: Medicaid Other | Admitting: "Endocrinology

## 2015-04-18 ENCOUNTER — Encounter: Payer: Self-pay | Admitting: "Endocrinology

## 2015-04-18 VITALS — BP 104/63 | Wt 155.0 lb

## 2015-04-18 DIAGNOSIS — R7303 Prediabetes: Secondary | ICD-10-CM

## 2015-04-18 DIAGNOSIS — H0520 Unspecified exophthalmos: Secondary | ICD-10-CM

## 2015-04-18 DIAGNOSIS — E049 Nontoxic goiter, unspecified: Secondary | ICD-10-CM

## 2015-04-18 DIAGNOSIS — R5383 Other fatigue: Secondary | ICD-10-CM

## 2015-04-18 DIAGNOSIS — E063 Autoimmune thyroiditis: Secondary | ICD-10-CM

## 2015-04-18 DIAGNOSIS — D509 Iron deficiency anemia, unspecified: Secondary | ICD-10-CM

## 2015-04-18 DIAGNOSIS — E038 Other specified hypothyroidism: Secondary | ICD-10-CM

## 2015-04-18 DIAGNOSIS — R7309 Other abnormal glucose: Secondary | ICD-10-CM

## 2015-04-18 DIAGNOSIS — E663 Overweight: Secondary | ICD-10-CM

## 2015-04-18 LAB — HEMOGLOBIN A1C
HEMOGLOBIN A1C: 5.7 % — AB (ref <5.7)
Mean Plasma Glucose: 117 mg/dL — ABNORMAL HIGH (ref <117)

## 2015-04-18 LAB — T3, FREE: T3, Free: 2.8 pg/mL (ref 2.3–4.2)

## 2015-04-18 LAB — T4, FREE: FREE T4: 1.08 ng/dL (ref 0.80–1.80)

## 2015-04-18 LAB — TSH: TSH: 1.151 u[IU]/mL (ref 0.350–4.500)

## 2015-04-18 NOTE — Patient Instructions (Signed)
Follow up visit in 3 months. Please repeat lab tests one week prior to next visit. 

## 2015-04-18 NOTE — Progress Notes (Signed)
Subjective:  Patient Name: Grace Wall Date of Birth: [birth year]  MRN: [id number]  Grace Wall  presents to the office today for follow-up of her hypothyroidism, Hashimoto's disease, goiter and exophthalmos  HISTORY OF PRESENT ILLNESS:   Grace Wall is a 28 y.o. [place] young woman. Grace Wall was unaccompanied.   1. The patient was first referred to me by herself on 05/08/05.   A. She was diagnosed with thyroid problems at age 40. At that point she was hyperthyroid, had a big anterior neck, and had big eyes, so she presumably had Graves' disease. She was placed on Tapazole ((methimazole). Tapazole was subsequently stopped at about age 52.  B. About one year later, she became hypothyroid, presumably due to coexistent Hashimoto's disease. She started Synthroid. The Synthroid was changed to generic levothyroxine in 2005. The patient's eyes had gradually improved over time. At that first visit with me, she was feeling fairly well, but was tired a lot. She was also somewhat mentally confused at times.   C. On physical examination she had intermittent proptosis bilaterally. The thyroid gland was 20+ grams in size. The right lobe was within normal limits, but the left lobe was slightly enlarged. Laboratory data showed a TSH of 3.09, free T4 1.22, and free T3 of 2.6. Her TPO antibody level was 1171.1, with normals being less than 60. A thyroid-stimulating immunoglobulin level was 64, with normals being <129. The thyroid binding inhibitory immunoglobulin was 96, with normals being less than 17. Her TSH receptor antibody was 87, with normals being less than 10. 2. Given the high levels of the thyroid receptor antibody, it was unclear how her thyroid tests would evolve. Repeat thyroid hormone tests performed on 05/18/2005 were markedly different. At this time the TSH was 0.032,  free T4 was 1.77, and free T3 was 3.9. At that point it was still unclear whether the fluctuations in thyroid hormone were due to changes in  the forms of levothyroxine made by different companies, or to  changes in thyroid receptor antibody levels, or both. I changed her to brand Synthroid at a dose of 150 mcg per day.   2. During the past 10 years we have followed her for several problems:  A. We've had to adjust her thyroid hormone doses frequently. Her TSH values have ranged from 0.04 - 8.58. Part of these fluctuations have been due to flare ups of Hashimoto's disease. In addition, because she has not had any health insurance during most of this time, she's often had to rely on thyroid hormone samples. Sometimes she's not always had exactly the right thyroid hormone doses. She also appears to have had fluctuations in her thyroid receptor antibodies which have caused changes in her thyroid hormone levels.  Because of her lack of insurance, however, we had not been able to repeat her thyroid receptor antibody testing.  B. She has also had a problem with chronic iron deficiency anemia.   C. In the last 3 months she's had two elevated HbA1c values in the prediabetes zone.  3. The patient's last [facility] visit was on 10/16/14. In the interim, she has been healthy. She is taking Synthroid  175 mcg tablets, one tablet per day on weekdays, but only 1/2 tablet per day on Saturdays and Sundays. She feels much better since resuming brand Synthroid. She takes her Synthroid in the mornings and the One-a-Day MVI for women at night.   4. Pertinent Review of Systems:  Constitutional: The patient feels "good, but tired". She is no  longer losing hair excessively. Her fatigue is probably unchanged since her last visit. She is sometimes cold. She feels that she sleeps well.  Eyes: Vision is pretty good, but she sometimes needs her glasses for reading. There are no other significant eye complaints. Her right eye is always very slightly more prominent than her left Neck: The patient has no complaints of anterior neck swelling, soreness, tenderness,  pressure,  discomfort, or difficulty swallowing.  Heart: Heart rate increases with exercise or other physical activity. The patient has no complaints of palpitations, irregular heat beats, chest pain, or chest pressure. Gastrointestinal: BMs have been fairly normal. The patient has no complaints of reflux, upset stomach, stomach aches, diarrhea, or constipation. Legs: Muscle mass and strength seem normal. There are no complaints of numbness, tingling, burning, or pain. No edema is noted. Feet: There are no obvious foot problems. There are no complaints of numbness, tingling, burning, or pain. No edema is noted. GYN Her LMP was 03/27/15. Her periods have been regular. She always feels more tired and depressed for several days before her menstrual periods.    PAST MEDICAL, FAMILY, AND SOCIAL HISTORY:  Past Medical History  Diagnosis Date  . Hypothyroidism, acquired, autoimmune   . Thyroiditis, autoimmune   . Thyrotoxicosis with diffuse goiter   . Exophthalmos   . Graves disease   . Graves' disease     Family History  Problem Relation Age of Onset  . Cancer Maternal Aunt   . Thyroid disease Son      Current outpatient prescriptions:  .  levothyroxine (SYNTHROID) 175 MCG tablet, Take 1 tablet (175 mcg total) by mouth daily before breakfast., Disp: 30 tablet, Rfl: 12 .  levothyroxine (SYNTHROID, LEVOTHROID) 175 MCG tablet, TAKE ONE TABLET BY MOUTH ONCE DAILY BEFORE BREAKFAST, Disp: 30 tablet, Rfl: 0  Allergies as of 04/18/2015  . (No Known Allergies)    1. 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 She is on "Family Planning Medicaid". 2. Activities: She stays busy with child care.  3. Smoking, alcohol, or drugs: None 4. Primary Care Provider: None.   REVIEW OF SYSTEMS: There are no other significant problems involving the patient's other body systems.   Objective:  Vital Signs:  BP 104/63 mmHg  Wt 155 lb (70.308 kg)  Wt Readings from Last 3  Encounters:  04/18/15 155 lb (70.308 kg)  10/16/14 157 lb (71.215 kg)  04/17/14 160 lb 9.6 oz (72.848 kg)   PHYSICAL EXAM: Constitutional: The patient appears healthy, but overweight/obese. She has lost 2  pounds in 6 months. She looks bright, alert, but also somewhat tired. She has normal affect and insight today. She does smile and laugh, but less frequently than at last visit.   Eyes:  There is no obvious arcus. The eyes are normal today. She has no inferior proptosis of either eye today. Extraocular movements of her eyes are normal. She feels no sense of eye muscle pressure when looking upward and left or looking upward and right. Moisture appears normal. Mouth: The oropharynx and tongue appear normal. Oral moisture is normal. There is no hyperpigmentation.  Neck: The neck appears to be visibly normal. No carotid bruits are noted. The thyroid gland is smaller at 18-20 grams in size. Both lobes are within normal limits for size. The consistency of the thyroid gland is normal. The thyroid gland is not tender to palpation. Lungs: The lungs are clear to auscultation. Air movement is good. Heart: Heart  rate and rhythm are regular. Heart sounds S1 and S2 are normal. I did not appreciate any pathologic cardiac murmurs. Abdomen: The abdomen is enlarged. Bowel sounds are normal. There is no obvious hepatomegaly, splenomegaly, or other mass effect.  Arms: Muscle size and bulk are normal for age. Hands: There is no obvious tremor. Phalangeal and metacarpophalangeal joints are normal. Palmar muscles are normal for age. Palmar skin is normal. Palmar moisture is also normal. There is no hyperpigmentation.  Legs: Muscles appear normal for age. No edema is present. Neurologic: Strength is normal for age in both the upper and lower extremities. Muscle tone is normal. Sensation to touch is normal in both legs.    LAB DATA:   04/15/15: HbA1c 5.7%; Hgb 11.7, Hct 35.7, iron 36 (normal 40-190); TSH 1.151, free T4  1.08, free T3 2.8  01/10/15: HbA1c 5.9%; Hgb 11.9, Hct 36.3, iron 43; TSH 2.853, free T4 0.83, free T3 2.5  10/15/14: Hgb 11.3, Hct 35.6, MCH 25.6,Iron 24; TSH 2.549, free T4 0.99, free T3 2.4  04/13/14: Iron 24; Hgb 11.1, Hct 32.6%, MCV 74.9; TSH 0.122, free T4 1.45, free T3 3.3  08/04/12: TSH 0.824, free T4 1.26, free T3 3.4  02/01/13: Iron 84, Hgb 11.8, Hct 34.8%  01/30/13; TSH 1.397, free T4 1.44, free T3 2.8  08/02/12: TSH 3.078, free T4 1.14, free T3 2.7  12/15/11: TSH was 0.520, free T4 1.40, free T3  2.7   Assessment and Plan:   ASSESSMENT: 1. Hypothyroid/hyperthyroid: The patient is euthyroid at about the 66% of the normal range.   2. Thyroiditis: Her Hashimoto's disease is clinically quiescent.  3. Goiter: Her thyroid gland is smaller today. The process of waxing and waning of thyroid gland size is c/w evolving thyroiditis.  4. Exophthalmos: The eyes are normal again today. Her exophthalmos due to Graves' disease has resolved.  5. Overweight: She has lost 2 pounds. She is not as physically active as she needs to be.   6. Fatigue/coldness: Her fatigue and sensation of being cold are due partly to iron deficiency anemia and partly to iron deficiency alone. 7. Hypotension: Her BP is good. Her dizziness has resolved. When she took One-a-Day MVI with iron before she felt well overall.  8. Iron deficiency anemia: Despite treatment with her MVI once daily, her iron level has decreased and her Hgb and Hct have also decreased in parallel. She needs to take iron every day and to take a stool softener or Miralax daily as well.  9. Pre-diabetes: Her HbA1c was in the prediabetic range in June and is still in that range in September, although only barely so.   PLAN: 1. Diagnostic: Repeat her thyroid tests, CBC, and iron in 3 months and 6 months.  2. Therapeutic: Continue her current Synthroid doses of  175 mcg pills 5 days per week, but only 1/2 pill on Saturdays and Sundays. Change her MVI to  one pill at dinner and one pill at bedtime. Add iron at dinner and Miralax at the same time. Start the Eat Right Diet.  3. Patient education: We discussed the fact that we may have to adjust her thyroid dose doses every 6-12 months until she has lost all of the thyroid cells she is going to lose, whenever that occurs. We discussed the effects that calcium and iron have to decrease synthroid absorption. We discussed the need for her to remain on brand Synthroid. Generic LT4 does not give her steady level of thyroid hormone.  4. Follow-up:  6 months  Level of Service: This visit lasted in excess of 45 minutes. More than 50% of the visit was devoted to counseling.  David Stall

## 2015-07-21 NOTE — L&D Delivery Note (Signed)
30 y.o. G3P2002 at 4171w0d delivered a viable female infant in cephalic, LOA position. No nuchal cord. Right anterior shoulder delivered with ease. 60 sec delayed cord clamping. Cord clamped x2 and cut. Placenta delivered spontaneously intact, with 3VC. Fundus firm on exam with massage and pitocin. Good hemostasis noted.  Laceration: 1st degree perineal; left sulcal; left vaginal Suture: 3-0 Vicryl; 4-0 Monocryl Good hemostasis noted.  Mom and baby recovering in LDR.    Apgars: 6/9 Weight: pending skin to skin    Jen MowElizabeth Mumaw, DO OB Fellow [facility], [facility] 07/16/2016, 8:56 AM

## 2015-07-25 ENCOUNTER — Ambulatory Visit: Payer: Medicaid Other | Admitting: "Endocrinology

## 2015-08-08 ENCOUNTER — Ambulatory Visit: Payer: Medicaid Other | Admitting: "Endocrinology

## 2015-08-27 ENCOUNTER — Other Ambulatory Visit (HOSPITAL_COMMUNITY): Payer: Self-pay | Admitting: *Deleted

## 2015-08-27 DIAGNOSIS — R2231 Localized swelling, mass and lump, right upper limb: Secondary | ICD-10-CM

## 2015-08-29 ENCOUNTER — Ambulatory Visit (HOSPITAL_COMMUNITY)
Admission: RE | Admit: 2015-08-29 | Discharge: 2015-08-29 | Disposition: A | Discharge status: Home / Self Care | Payer: Medicaid Other | Source: Ambulatory Visit | Attending: Obstetrics and Gynecology | Admitting: Obstetrics and Gynecology

## 2015-08-29 ENCOUNTER — Encounter (HOSPITAL_COMMUNITY): Payer: Self-pay

## 2015-08-29 ENCOUNTER — Ambulatory Visit: Payer: Medicaid Other

## 2015-08-29 VITALS — BP 100/62 | Temp 97.5°F | Ht 65.0 in | Wt 152.0 lb

## 2015-08-29 DIAGNOSIS — Z01419 Encounter for gynecological examination (general) (routine) without abnormal findings: Secondary | ICD-10-CM

## 2015-08-29 DIAGNOSIS — N898 Other specified noninflammatory disorders of vagina: Secondary | ICD-10-CM

## 2015-08-29 DIAGNOSIS — N644 Mastodynia: Secondary | ICD-10-CM

## 2015-08-29 NOTE — Progress Notes (Addendum)
Complaints of right axillary lump x 10 years that initially started with a lump with whitish colored discharge. Patient states she only had discharge once from lump. Patient states the lump comes and goes usually occuring around the time of her menstrual cycle monthly. Patient complained of right axillary pain that radiates to the lower breast when occurs. Patient rates pain at a 7 out of 10.  Pap Smear: Pap smear completed today. Last Pap smear was 4 years ago at the [facility] and normal per patient. Per patient has no history of an abnormal Pap smear. No Pap smear results in EPIC.  Physical exam: Breasts Breasts symmetrical. No skin abnormalities bilateral breasts. No nipple retraction bilateral breasts. No nipple discharge bilateral breasts. No lymphadenopathy. No lumps palpated bilateral breasts. Complaints of tenderness when palpated right axillary area. Explained to patient that since not symptomatic today that we will cancel her follow up appointment at the [facility] and reschedule when symptoms occur. Patient stated the symptoms occur every month around the time of her menstrual cycle. Patient will call either Sabrina or I when symptoms occur to schedule follow up.  Pelvic/Bimanual   Ext Genitalia No lesions, no swelling and thick white discharge observed on external genitalia.         Vagina Vagina pink and normal texture. No lesions and copious amount of thick white discharge observed in vagina. Wet prep completed.          Cervix Cervix is present. Cervix pink and of normal texture. Thick white discharge observed on cervix.    Uterus Uterus is present and palpable. Uterus in normal position and normal size.        Adnexae Bilateral ovaries present and palpable. No tenderness on palpation.          Rectovaginal No rectal exam completed today since patient had no rectal complaints. No skin abnormalities observed on exam.    Smoking History: Patient has  never smoked.  Patient Navigation: Patient education provided. Access to services provided for patient through [facility] program.

## 2015-08-29 NOTE — Addendum Note (Signed)
Encounter addended by: Priscille Heidelberg, RN on: 08/29/2015  3:05 PM<BR>     Documentation filed: Visit Diagnoses

## 2015-08-29 NOTE — Addendum Note (Signed)
Encounter addended by: Priscille Heidelberg, RN on: 08/29/2015 12:41 PM<BR>     Documentation filed: Charges VN, Notes Section

## 2015-08-29 NOTE — Patient Instructions (Addendum)
Educational materials on self breast awareness given. Explained to Truddie Coco that [facility] will cover Pap smears and HPV typing every 5 years unless has a history of abnormal Pap smears. Referred patient to the [facility] for diagnostic mammogram and right breast ultrasound. Explained to patient that since not symptomatic today that we will cancel her follow up appointment at the [facility] and reschedule when symptoms occur. Patient stated the symptoms occur every month around the time of her menstrual cycle. Patient will call either Sabrina or I when symptoms occur to schedule follow up. Let patient know will follow up with her within the next couple weeks with results of Pap smear and wet prep by phone. Truddie Coco verbalized understanding.  Nickoles Gregori, Kathaleen Maser, RN 9:41 AM

## 2015-08-30 LAB — CYTOLOGY - PAP

## 2015-09-02 ENCOUNTER — Encounter (HOSPITAL_COMMUNITY): Payer: Self-pay | Admitting: *Deleted

## 2015-09-06 ENCOUNTER — Telehealth (HOSPITAL_COMMUNITY): Payer: Self-pay | Admitting: *Deleted

## 2015-09-06 NOTE — Telephone Encounter (Signed)
Patient returned phone call. Let patient know that her Pap smear was normal and HPV negative. Informed patient that her next Pap smear will be due in 5 years since she has no history of an abnormal Pap smear. Let patient know that the wet prep was negative. Patient stated she has not noticed the lump or pain since [facility] visit and will notify when it comes back to schedule follow up. Patient verbalized understanding.

## 2015-09-06 NOTE — Telephone Encounter (Signed)
Attempted to call patient to discuss Pap smear and wet prep results. No one answered phone. Left voicemail for patient to call me back.  

## 2015-10-02 ENCOUNTER — Encounter (HOSPITAL_COMMUNITY): Payer: Self-pay | Admitting: *Deleted

## 2015-10-07 ENCOUNTER — Telehealth (HOSPITAL_COMMUNITY): Payer: Self-pay | Admitting: *Deleted

## 2015-10-07 NOTE — Telephone Encounter (Signed)
Patient called and stated was now having the pain in right breast and axillary area. Advised patient would call and schedule appointment with the [facility] Center. Patient is scheduled for Wed March 22 8:40. Advised patient of appointment.

## 2015-10-09 ENCOUNTER — Ambulatory Visit
Admission: RE | Admit: 2015-10-09 | Discharge: 2015-10-09 | Disposition: A | Discharge status: Home / Self Care | Payer: No Typology Code available for payment source | Source: Ambulatory Visit | Attending: Obstetrics and Gynecology | Admitting: Obstetrics and Gynecology

## 2015-10-09 DIAGNOSIS — R2231 Localized swelling, mass and lump, right upper limb: Secondary | ICD-10-CM

## 2015-11-28 ENCOUNTER — Ambulatory Visit (INDEPENDENT_AMBULATORY_CARE_PROVIDER_SITE_OTHER): Payer: Medicaid Other | Admitting: Certified Nurse Midwife

## 2015-11-28 ENCOUNTER — Other Ambulatory Visit: Payer: Self-pay | Admitting: *Deleted

## 2015-11-28 ENCOUNTER — Telehealth: Payer: Self-pay | Admitting: *Deleted

## 2015-11-28 ENCOUNTER — Encounter: Payer: Self-pay | Admitting: Certified Nurse Midwife

## 2015-11-28 ENCOUNTER — Telehealth: Payer: Self-pay | Admitting: "Endocrinology

## 2015-11-28 VITALS — BP 106/62 | HR 75 | Temp 97.7°F | Wt 154.0 lb

## 2015-11-28 DIAGNOSIS — Z3491 Encounter for supervision of normal pregnancy, unspecified, first trimester: Secondary | ICD-10-CM | POA: Diagnosis not present

## 2015-11-28 DIAGNOSIS — O219 Vomiting of pregnancy, unspecified: Secondary | ICD-10-CM | POA: Diagnosis not present

## 2015-11-28 DIAGNOSIS — Z3490 Encounter for supervision of normal pregnancy, unspecified, unspecified trimester: Secondary | ICD-10-CM

## 2015-11-28 DIAGNOSIS — Z331 Pregnant state, incidental: Secondary | ICD-10-CM

## 2015-11-28 DIAGNOSIS — K5901 Slow transit constipation: Secondary | ICD-10-CM

## 2015-11-28 DIAGNOSIS — E034 Atrophy of thyroid (acquired): Secondary | ICD-10-CM

## 2015-11-28 DIAGNOSIS — Z1389 Encounter for screening for other disorder: Secondary | ICD-10-CM | POA: Diagnosis not present

## 2015-11-28 DIAGNOSIS — O0991 Supervision of high risk pregnancy, unspecified, first trimester: Secondary | ICD-10-CM

## 2015-11-28 HISTORY — DX: Encounter for supervision of normal pregnancy, unspecified, unspecified trimester: Z34.90

## 2015-11-28 LAB — POCT URINALYSIS DIPSTICK
Bilirubin, UA: NEGATIVE
Blood, UA: NEGATIVE
GLUCOSE UA: NEGATIVE
Ketones, UA: NEGATIVE
LEUKOCYTES UA: NEGATIVE
Nitrite, UA: NEGATIVE
PROTEIN UA: NEGATIVE
SPEC GRAV UA: 1.01
UROBILINOGEN UA: NEGATIVE
pH, UA: 6

## 2015-11-28 MED ORDER — VITAFOL GUMMIES 3.33-0.333-34.8 MG PO CHEW: 3.0000 | CHEWABLE_TABLET | Freq: Every day | ORAL | Status: DC

## 2015-11-28 MED ORDER — DOXYLAMINE-PYRIDOXINE 10-10 MG PO TBEC: DELAYED_RELEASE_TABLET | ORAL | Status: DC

## 2015-11-28 MED ORDER — PROMETHAZINE HCL 25 MG PO TABS: 25.0000 mg | ORAL_TABLET | Freq: Four times a day (QID) | ORAL | Status: DC | PRN

## 2015-11-28 MED ORDER — DOCUSATE SODIUM 100 MG PO CAPS: 100.0000 mg | ORAL_CAPSULE | Freq: Two times a day (BID) | ORAL | Status: DC

## 2015-11-28 MED ORDER — METOCLOPRAMIDE HCL 10 MG PO TABS: 10.0000 mg | ORAL_TABLET | Freq: Four times a day (QID) | ORAL | Status: DC

## 2015-11-28 NOTE — Progress Notes (Signed)
Patient has concerned that she feels horrible really sick.

## 2015-11-28 NOTE — Progress Notes (Signed)
Subjective:    Grace Wall is being seen today for her first obstetrical visit.  This is a planned pregnancy. She is at [redacted]w[redacted]d gestation. Her obstetrical history is significant for hashimotos thyroid: Synthroid. Relationship with FOB: spouse, living together. Patient does intend to breast feed, has breast fed children for 1.5-2 years. Pregnancy history fully reviewed.  The information documented in the HPI was reviewed and verified.  Menstrual History: OB History    Gravida Para Term Preterm AB TAB SAB Ectopic Multiple Living   Menarche age: 30 years of age.    Patient's last menstrual period was 10/10/2015.    Past Medical History  Diagnosis Date  . Hypothyroidism, acquired, autoimmune   . Thyroiditis, autoimmune   . Thyrotoxicosis with diffuse goiter   . Exophthalmos   . Graves disease   . Graves' disease     No past surgical history on file.   (Not in a hospital admission) No Known Allergies  Social History  Substance Use Topics  . Smoking status: Never Smoker   . Smokeless tobacco: Never Used  . Alcohol Use: No     Comment: ocassionally    Family History  Problem Relation Age of Onset  . Cancer Maternal Aunt   . Thyroid disease Son   . Diabetes Mother   . Hypertension Father      Review of Systems Constitutional: negative for weight loss Gastrointestinal: + for nausea and vomiting, 3-4X/day.  Genitourinary:negative for genital lesions and vaginal discharge and dysuria Musculoskeletal:negative for back pain Behavioral/Psych: negative for abusive relationship, depression, illegal drug usage and tobacco use    Objective:    BP 106/62 mmHg  Pulse 75  Temp(Src) 97.7 F (36.5 C)  Wt 154 lb (69.854 kg)  LMP 10/10/2015 General Appearance:    Alert, cooperative, no distress, appears stated age  Head:    Normocephalic, without obvious abnormality, atraumatic  Eyes:    PERRL, conjunctiva/corneas clear, EOM's intact, fundi    benign,  both eyes  Ears:    Normal TM's and external ear canals, both ears  Nose:   Nares normal, septum midline, mucosa normal, no drainage    or sinus tenderness  Throat:   Lips, mucosa, and tongue normal; teeth and gums normal  Neck:   Supple, symmetrical, trachea midline, no adenopathy;    thyroid:  no enlargement/tenderness/nodules; no carotid   bruit or JVD  Back:     Symmetric, no curvature, ROM normal, no CVA tenderness  Lungs:     Clear to auscultation bilaterally, respirations unlabored  Chest Wall:    No tenderness or deformity   Heart:    Regular rate and rhythm, S1 and S2 normal, no murmur, rub   or gallop  Breast Exam:    No tenderness, masses, or nipple abnormality  Abdomen:     Soft, non-tender, bowel sounds active all four quadrants,    no masses, no organomegaly  Genitalia:    Normal female without lesion, discharge or tenderness  Extremities:   Extremities normal, atraumatic, no cyanosis or edema  Pulses:   2+ and symmetric all extremities  Skin:   Skin color, texture, turgor normal, no rashes or lesions  Lymph nodes:   Cervical, supraclavicular, and axillary nodes normal  Neurologic:   CNII-XII intact, normal strength, sensation and reflexes    throughout          Cervix: long, thick, closed  and posterior    Lab Review Urine pregnancy test Labs reviewed yes Radiologic studies reviewed no Assessment:    Pregnancy at 3468w0d weeks   H/O thyroid issues  Plan:    Patient is to f/u with endocrinologist as previous established.  MFM consult and NT Prenatal vitamins.  Counseling provided regarding continued use of seat belts, cessation of alcohol consumption, smoking or use of illicit drugs; infection precautions i.e., influenza/TDAP immunizations, toxoplasmosis,CMV, parvovirus, listeria and varicella; workplace safety, exercise during pregnancy; routine dental care, safe medications, sexual activity, hot tubs, saunas, pools, travel, caffeine use, fish and methlymercury,  potential toxins, hair treatments, varicose veins Weight gain recommendations per IOM guidelines reviewed: underweight/BMI< 18.5--> gain 28 - 40 lbs; normal weight/BMI 18.5 - 24.9--> gain 25 - 35 lbs; overweight/BMI 25 - 29.9--> gain 15 - 25 lbs; obese/BMI >30->gain  11 - 20 lbs Problem list reviewed and updated. FIRST/CF mutation testing/NIPT/QUAD SCREEN/fragile X/Ashkenazi Jewish population testing/Spinal muscular atrophy discussed: requested. Role of ultrasound in pregnancy discussed; fetal survey: ordered. Amniocentesis discussed: not indicated. VBAC calculator score: VBAC consent form provided Meds ordered this encounter  Medications  . Prenatal MV-Min-Fe Fum-FA-DHA (PRENATAL 1 PO)    Sig: Take by mouth daily.   Orders Placed This Encounter  Procedures  . POCT urinalysis dipstick    Follow up in 4 weeks. 50% of 30 min visit spent on counseling and coordination of care. 3

## 2015-11-28 NOTE — Telephone Encounter (Signed)
PA for Diclegis has been submitted and approved online-[facility].

## 2015-11-28 NOTE — Telephone Encounter (Signed)
Pharmacy is calling about a possible interaction of medications that patient is to use long term and they would like to talk with the prescriber before filling. [name]

## 2015-11-28 NOTE — Telephone Encounter (Signed)
Labs placed in portal. 

## 2015-11-29 ENCOUNTER — Other Ambulatory Visit: Payer: Self-pay | Admitting: Certified Nurse Midwife

## 2015-11-29 NOTE — Telephone Encounter (Signed)
Spoke with [facility], regarding Phenergan and Reglan.  Desire for her to take Reglan during the day and the phenergan at night since it will make her sleepy.  [facility] stated they will f/u with the patient and those directions.

## 2015-11-30 LAB — URINE CULTURE, OB REFLEX

## 2015-11-30 LAB — CULTURE, OB URINE

## 2015-12-02 LAB — PRENATAL PROFILE I(LABCORP)
Antibody Screen: NEGATIVE
BASOS ABS: 0 10*3/uL (ref 0.0–0.2)
Basos: 0 %
EOS (ABSOLUTE): 0 10*3/uL (ref 0.0–0.4)
EOS: 0 %
Hematocrit: 36.1 % (ref 34.0–46.6)
Hemoglobin: 11.7 g/dL (ref 11.1–15.9)
Hepatitis B Surface Ag: NEGATIVE
IMMATURE GRANULOCYTES: 0 %
Immature Grans (Abs): 0 10*3/uL (ref 0.0–0.1)
LYMPHS ABS: 1.6 10*3/uL (ref 0.7–3.1)
Lymphs: 28 %
MCH: 27 pg (ref 26.6–33.0)
MCHC: 32.4 g/dL (ref 31.5–35.7)
MCV: 83 fL (ref 79–97)
MONOCYTES: 7 %
Monocytes Absolute: 0.4 10*3/uL (ref 0.1–0.9)
NEUTROS PCT: 65 %
Neutrophils Absolute: 3.6 10*3/uL (ref 1.4–7.0)
PLATELETS: 156 10*3/uL (ref 150–379)
RBC: 4.34 x10E6/uL (ref 3.77–5.28)
RDW: 14.5 % (ref 12.3–15.4)
RH TYPE: POSITIVE
RPR Ser Ql: NONREACTIVE
Rubella Antibodies, IGG: 30.1 index (ref >0.99)
WBC: 5.5 10*3/uL (ref 3.4–10.8)

## 2015-12-02 LAB — HEMOGLOBINOPATHY EVALUATION
HEMOGLOBIN A2 QUANTITATION: 2.3 % (ref 0.7–3.1)
HGB C: 0 %
HGB S: 0 %
Hemoglobin F Quantitation: 0 % (ref 0.0–2.0)
Hgb A: 97.7 % (ref 94.0–98.0)

## 2015-12-02 LAB — VARICELLA ZOSTER ANTIBODY, IGG: VARICELLA: 1107 {index} (ref >165)

## 2015-12-02 LAB — HIV ANTIBODY (ROUTINE TESTING W REFLEX): HIV Screen 4th Generation wRfx: NONREACTIVE

## 2015-12-02 LAB — TSH: TSH: 2.33 u[IU]/mL (ref 0.450–4.500)

## 2015-12-04 LAB — CBC WITH DIFFERENTIAL/PLATELET
BASOS ABS: 0 {cells}/uL (ref 0–200)
Basophils Relative: 0 %
EOS ABS: 0 {cells}/uL — AB (ref 15–500)
Eosinophils Relative: 0 %
HCT: 36.4 % (ref 35.0–45.0)
Hemoglobin: 11.7 g/dL (ref 11.7–15.5)
LYMPHS PCT: 25 %
Lymphs Abs: 1325 cells/uL (ref 850–3900)
MCH: 26.7 pg — AB (ref 27.0–33.0)
MCHC: 32.1 g/dL (ref 32.0–36.0)
MCV: 83.1 fL (ref 80.0–100.0)
MONOS PCT: 5 %
MPV: 11.3 fL (ref 7.5–12.5)
Monocytes Absolute: 265 cells/uL (ref 200–950)
NEUTROS PCT: 70 %
Neutro Abs: 3710 cells/uL (ref 1500–7800)
PLATELETS: 135 10*3/uL — AB (ref 140–400)
RBC: 4.38 MIL/uL (ref 3.80–5.10)
RDW: 15.8 % — ABNORMAL HIGH (ref 11.0–15.0)
WBC: 5.3 10*3/uL (ref 3.8–10.8)

## 2015-12-05 ENCOUNTER — Other Ambulatory Visit: Payer: Self-pay | Admitting: *Deleted

## 2015-12-05 ENCOUNTER — Encounter: Payer: Self-pay | Admitting: *Deleted

## 2015-12-05 ENCOUNTER — Encounter: Payer: Self-pay | Admitting: "Endocrinology

## 2015-12-05 ENCOUNTER — Ambulatory Visit (INDEPENDENT_AMBULATORY_CARE_PROVIDER_SITE_OTHER): Payer: Medicaid Other | Admitting: "Endocrinology

## 2015-12-05 VITALS — BP 104/57 | HR 69 | Wt 154.2 lb

## 2015-12-05 DIAGNOSIS — E049 Nontoxic goiter, unspecified: Secondary | ICD-10-CM

## 2015-12-05 DIAGNOSIS — E038 Other specified hypothyroidism: Secondary | ICD-10-CM

## 2015-12-05 DIAGNOSIS — E063 Autoimmune thyroiditis: Secondary | ICD-10-CM

## 2015-12-05 DIAGNOSIS — H0520 Unspecified exophthalmos: Secondary | ICD-10-CM

## 2015-12-05 DIAGNOSIS — Z3481 Encounter for supervision of other normal pregnancy, first trimester: Secondary | ICD-10-CM

## 2015-12-05 DIAGNOSIS — E663 Overweight: Secondary | ICD-10-CM | POA: Diagnosis not present

## 2015-12-05 DIAGNOSIS — E349 Endocrine disorder, unspecified: Secondary | ICD-10-CM

## 2015-12-05 DIAGNOSIS — R7303 Prediabetes: Secondary | ICD-10-CM

## 2015-12-05 DIAGNOSIS — Z331 Pregnant state, incidental: Secondary | ICD-10-CM

## 2015-12-05 LAB — IRON: IRON: 51 ug/dL (ref 40–190)

## 2015-12-05 LAB — TSH: TSH: 3.52 m[IU]/L

## 2015-12-05 LAB — HEMOGLOBIN A1C
HEMOGLOBIN A1C: 5.3 % (ref <5.7)
Mean Plasma Glucose: 105 mg/dL

## 2015-12-05 LAB — T4, FREE: FREE T4: 1.1 ng/dL (ref 0.8–1.8)

## 2015-12-05 LAB — T3, FREE: T3 FREE: 2.2 pg/mL — AB (ref 2.3–4.2)

## 2015-12-05 MED ORDER — SYNTHROID 175 MCG PO TABS: ORAL_TABLET | ORAL | Status: DC

## 2015-12-05 MED ORDER — LEVOTHYROXINE SODIUM 175 MCG PO TABS: ORAL_TABLET | ORAL | Status: DC

## 2015-12-05 NOTE — Progress Notes (Addendum)
Subjective:  Patient Name: Grace Wall Date of Birth: 10-21-1985  MRN: [id number]  Grace Wall  presents to the office today for follow-up of her hypothyroidism, Hashimoto's disease, goiter and exophthalmos in the setting of a new pregnancy.  HISTORY OF PRESENT ILLNESS:   Grace Wall is a 30 y.o. [place] young woman. Grace Wall was accompanied by her mother.   1. The patient was first referred to me by herself on 05/08/05.   A. She was diagnosed with thyroid problems at age 70. At that point she was hyperthyroid, had a big anterior neck, and had big eyes, so she presumably had Graves' disease. She was placed on Tapazole ((methimazole). Tapazole was subsequently stopped at about age 89.  B. About one year later, she became hypothyroid, presumably due to coexistent Hashimoto's disease. She started Synthroid. The Synthroid was changed to generic levothyroxine in 2005. The patient's eyes had gradually improved over time. At that first visit with me, she was feeling fairly well, but was tired a lot. She was also somewhat mentally confused at times.   C. On physical examination she had intermittent proptosis bilaterally. The thyroid gland was 20+ grams in size. The right lobe was within normal limits, but the left lobe was slightly enlarged. Laboratory data showed a TSH of 3.09, free T4 1.22, and free T3 of 2.6. Her TPO antibody level was 1171.1, with normals being less than 60. A thyroid-stimulating immunoglobulin level was 64, with normals being <129. The thyroid binding inhibitory immunoglobulin was 96, with normals being less than 17. Her TSH receptor antibody was 87, with normals being less than 10. 2. Given the high levels of the thyroid receptor antibody, it was unclear how her thyroid tests would evolve. Repeat thyroid hormone tests performed on 05/18/2005 were markedly different. At this time the TSH was 0.032,  free T4 was 1.77, and free T3 was 3.9. At that point it was still unclear whether the  fluctuations in thyroid hormone were due to changes in the forms of levothyroxine made by different companies, or to  changes in thyroid receptor antibody levels, or both. I changed her to brand Synthroid at a dose of 150 mcg per day.   2. During the past 11 years we have followed her for several problems:  A. We've had to adjust her thyroid hormone doses frequently. Her TSH values have ranged from 0.04 - 8.58. Part of these fluctuations have been due to flare ups of Hashimoto's disease. In addition, because she has not had any health insurance during most of this time, she's often had to rely on thyroid hormone samples. Sometimes she's not always had exactly the right thyroid hormone doses. She also appears to have had fluctuations in her thyroid receptor antibodies which have caused changes in her thyroid hormone levels.  Because of her lack of insurance, however, we had not been able to repeat her thyroid receptor antibody testing.  B. She has also had a problem with chronic iron deficiency anemia.   C. She is overweight. In the last 12 months she's had two elevated HbA1c values in the prediabetes zone.  3. The patient's last [facility] visit was on 04/18/15.  A. In the interim, she has been healthy. She is taking Synthroid  175 mcg tablets, one tablet per day on weekdays, but only 1/2 tablet per day on Saturdays and Sundays. She takes her Synthroid in the mornings.  B. Her LMP was on 10/10/15 and her EDC is 09/17/15. She is now taking prenatal vitamins and  Diclegis (doxylamine succinate) for nausea and vomiting.  She has recently been taking her MVI 2-4 hours after the Synthroid dose.   4. Pertinent Review of Systems:  Constitutional: The patient feels "okay, but nauseated and tired". She is no longer losing hair excessively. She is sometimes cold. She tosses and turns all night, so her sleep quality is worse.   Eyes: Vision is pretty good, but she sometimes needs her glasses for reading. There are no other  significant eye complaints. She does not think that one eye is more prominent than the other.  Neck: The patient has no complaints of anterior neck swelling, soreness, tenderness,  pressure, discomfort, or difficulty swallowing.  Heart: Heart rate increases with exercise or other physical activity. The patient has no complaints of palpitations, irregular heat beats, chest pain, or chest pressure. Gastrointestinal: As above. She is more constipated now after taking prenatal vitamins. The patient has no complaints of reflux, upset stomach, stomach aches, or diarrhea. Legs: Muscle mass and strength seem normal. There are no complaints of numbness, tingling, burning, or pain. No edema is noted. Feet: There are no obvious foot problems. There are no complaints of numbness, tingling, burning, or pain. No edema is noted. GYN: As above    PAST MEDICAL, FAMILY, AND SOCIAL HISTORY:  Past Medical History  Diagnosis Date  . Hypothyroidism, acquired, autoimmune   . Thyroiditis, autoimmune   . Thyrotoxicosis with diffuse goiter   . Exophthalmos   . Graves disease   . Graves' disease     Family History  Problem Relation Age of Onset  . Cancer Maternal Aunt   . Thyroid disease Son   . Diabetes Mother   . Hypertension Father      Current outpatient prescriptions:  .  Doxylamine-Pyridoxine (DICLEGIS) 10-10 MG TBEC, Take 1 tablet with breakfast and lunch.  Take 2 tablets at bedtime., Disp: 100 tablet, Rfl: 4 .  levothyroxine (SYNTHROID, LEVOTHROID) 175 MCG tablet, TAKE ONE TABLET BY MOUTH ONCE DAILY BEFORE BREAKFAST, Disp: 30 tablet, Rfl: 0 .  Prenatal MV-Min-Fe Fum-FA-DHA (PRENATAL 1 PO), Take by mouth daily., Disp: , Rfl:  .  docusate sodium (COLACE) 100 MG capsule, Take 1 capsule (100 mg total) by mouth 2 (two) times daily. (Patient not taking: Reported on 12/05/2015), Disp: 90 capsule, Rfl: 1 .  metoCLOPramide (REGLAN) 10 MG tablet, Take 1 tablet (10 mg total) by mouth 4 (four) times daily.  (Patient not taking: Reported on 12/05/2015), Disp: 120 tablet, Rfl: 3 .  Prenatal Vit-Fe Phos-FA-Omega (VITAFOL GUMMIES) 3.33-0.333-34.8 MG CHEW, Chew 3 tablets by mouth daily. (Patient not taking: Reported on 12/05/2015), Disp: 90 tablet, Rfl: 12 .  promethazine (PHENERGAN) 25 MG tablet, Take 1 tablet (25 mg total) by mouth every 6 (six) hours as needed for nausea or vomiting. (Patient not taking: Reported on 12/05/2015), Disp: 30 tablet, Rfl: 1  Allergies as of 12/05/2015  . (No Known Allergies)    1. 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 She is on "Pregnancy Medicaid". Her mother now lives in the [place]  2. Activities: She stays busy with child care.  3. Smoking, alcohol, or drugs: None 4. Primary Care Provider: None.  5. OB: CNM Samantha Crimesachelle Anne Denney, [facility]  REVIEW OF SYSTEMS: There are no other significant problems involving the patient's other body systems.   Objective:  Vital Signs:  BP 104/57 mmHg  Pulse 69  Wt 154 lb 3.2 oz (69.945 kg)  LMP 10/10/2015  Wt Readings from Last 3 Encounters:  12/05/15 154 lb 3.2 oz (69.945 kg)  11/28/15 154 lb (69.854 kg)  08/29/15 152 lb (68.947 kg)   PHYSICAL EXAM: Constitutional: The patient appears healthy, but overweight/obese. She has lost almost one pound in the past 6 months. She is alert, but tired. She has normal affect and insight today.  Eyes:  There is no obvious arcus. She has slight inferior proptosis of her right eye today, but neither eye appears unusually prominent.  Extraocular movements of her eyes are normal. She feels no sense of eye muscle pressure when looking upward and left or looking upward and right. Moisture appears normal. Mouth: The oropharynx and tongue appear normal. Oral moisture is normal. There is no hyperpigmentation.  Neck: The neck appears to be visibly normal. No carotid bruits are noted. The thyroid gland is larger at about 21 grams in size. The right lobe  is within normal limits for size. The left lobe is mildly enlarged. The consistency of the thyroid gland is normal. The thyroid gland is not tender to palpation. Lungs: The lungs are clear to auscultation. Air movement is good. Heart: Heart rate and rhythm are regular. Heart sounds S1 and S2 are normal. I did not appreciate any pathologic cardiac murmurs. Abdomen: The abdomen is enlarged. Bowel sounds are normal. There is no obvious hepatomegaly, splenomegaly, or other mass effect.  Arms: Muscle size and bulk are normal for age. Hands: There is no obvious tremor. Phalangeal and metacarpophalangeal joints are normal. Palmar muscles are normal for age. Palmar skin is normal. Palmar moisture is also normal. There is no hyperpigmentation.  Legs: Muscles appear normal for age. No edema is present. Neurologic: Strength is normal for age in both the upper and lower extremities. Muscle tone is normal. Sensation to touch is normal in both legs and in the dorsa of both feet.    LAB DATA:   12/04/15: TSH 3.52, free T4 1.1, free T3 2.2; iron 51; HbA1c 5.3%  11/28/15: TSH 2.330; CBC normal except for [facility] of 26.7 (normal 27-33)  11/18/15: Positive pregnancy test  04/15/15: HbA1c 5.7%; Hgb 11.7, Hct 35.7, iron 36 (normal 40-190); TSH 1.151, free T4 1.08, free T3 2.8  01/10/15: HbA1c 5.9%; Hgb 11.9, Hct 36.3, iron 43; TSH 2.853, free T4 0.83, free T3 2.5  10/15/14: Hgb 11.3, Hct 35.6, MCH 25.6,Iron 24; TSH 2.549, free T4 0.99, free T3 2.4  04/13/14: Iron 24; Hgb 11.1, Hct 32.6%, MCV 74.9; TSH 0.122, free T4 1.45, free T3 3.3  08/04/12: TSH 0.824, free T4 1.26, free T3 3.4  02/01/13: Iron 84, Hgb 11.8, Hct 34.8%  01/30/13; TSH 1.397, free T4 1.44, free T3 2.8  08/02/12: TSH 3.078, free T4 1.14, free T3 2.7  12/15/11: TSH was 0.520, free T4 1.40, free T3  2.7   Assessment and Plan:   ASSESSMENT: 1. Hypothyroid/hyperthyroid:   A. In September 2016 [place] was euthyroid in the middle of the normal range. Her  TSH was in her TSH goal range of 1.0-2.0.   B. This week, however, she is mildly hypothyroid. This change in her thyroid hormone status may have been due to complexing of Synthroid with the iron and calcium in her prenatal vitamins, to increased placental deiodinase activity, or both. We need to increase her Synthroid dose to achieve the TSH goal range of 1.0-2.0.   2. Thyroiditis: Her Hashimoto's disease is clinically quiescent.  3. Goiter: Her thyroid gland is larger today. The process of waxing and waning  of thyroid gland size is c/w evolving thyroiditis. She may also have more HCG effect in this first trimester of pregnancy. 4. Exophthalmos: The eyes are normal in terms of prominence again today, but she does have a small amount of inferior proptosis of her right eye.   5. Overweight: She has lost 1 pound, but will gain more weight during pregnancy. She needs to walk an hour a day or equivalent exercise.  6. Fatigue/coldness/iron deficiency anemia:  A.  Her fatigue and sensation of being cold were due partly to iron deficiency anemia and partly to iron deficiency alone.   B. Her recent iron level was normal. Her recent CBC was normal except for a slightly low MCH. She needs to continue her prenatal MVIs. 7. Hypotension: Her BP is good. Her dizziness has resolved. When she took One-a-Day MVI with iron before she felt well overall.  8. Pre-diabetes: Her HbA1c was in the prediabetic range in June and September 2016, but is now well within normal limits. To keep her BGs in good control during the pregnancy, however, daily exercise will help.  9. Pregnancy: We will need to repeat her TFTs about every 6-8 weeks and see her about every 6-8 weeks during the pregnancy.  PLAN: 1. Diagnostic: Repeat her thyroid tests in 6 weeks.   2. Therapeutic: Increase her Synthroid dose to one 175 mcg pill per day. Take MVI about dinner time. Review the Eat Right Diet.  3. Patient education: We discussed the fact that  we will have to adjust her Synthroid dose about every 6-8 weeks during the pregnancy.  We discussed the effects that calcium and iron have to decrease Synthroid absorption. We discussed the need for her to remain on brand Synthroid. Generic LT4 does not give her steady level of thyroid hormone.  4. Follow-up: 6-8 weeks  Level of Service: This visit lasted in excess of 45 minutes. More than 50% of the visit was devoted to counseling.  David Stall

## 2015-12-05 NOTE — Patient Instructions (Signed)
Follow up visit in 6-8 weeks. Please repeat thyroid blood tests in 6 weeks.

## 2015-12-23 ENCOUNTER — Encounter (HOSPITAL_COMMUNITY): Payer: Self-pay | Admitting: Certified Nurse Midwife

## 2015-12-26 ENCOUNTER — Encounter: Payer: Medicaid Other | Admitting: Certified Nurse Midwife

## 2015-12-26 ENCOUNTER — Ambulatory Visit (INDEPENDENT_AMBULATORY_CARE_PROVIDER_SITE_OTHER): Payer: Medicaid Other | Admitting: Certified Nurse Midwife

## 2015-12-26 VITALS — BP 114/68 | HR 74 | Temp 98.9°F | Wt 152.0 lb

## 2015-12-26 DIAGNOSIS — Z1389 Encounter for screening for other disorder: Secondary | ICD-10-CM | POA: Diagnosis not present

## 2015-12-26 DIAGNOSIS — Z331 Pregnant state, incidental: Secondary | ICD-10-CM | POA: Diagnosis not present

## 2015-12-26 DIAGNOSIS — O0991 Supervision of high risk pregnancy, unspecified, first trimester: Secondary | ICD-10-CM

## 2015-12-26 DIAGNOSIS — Z3491 Encounter for supervision of normal pregnancy, unspecified, first trimester: Secondary | ICD-10-CM

## 2015-12-26 LAB — POCT URINALYSIS DIPSTICK
BILIRUBIN UA: NEGATIVE
GLUCOSE UA: NEGATIVE
KETONES UA: NEGATIVE
LEUKOCYTES UA: NEGATIVE
NITRITE UA: NEGATIVE
Spec Grav, UA: 1.025
Urobilinogen, UA: NEGATIVE
pH, UA: 5

## 2015-12-26 NOTE — Progress Notes (Signed)
  Subjective:    Grace Wall is a 30 y.o. female being seen today for her obstetrical visit. She is at 7346w0d gestation. Patient reports: no complaints.  Problem List Items Addressed This Visit    None    Visit Diagnoses    Supervision of high risk pregnancy in first trimester    -  Primary    Relevant Orders    MaterniT21 PLUS Core+SCA      Patient Active Problem List   Diagnosis Date Noted  . Encounter for supervision of high risk pregnancy in first trimester, antepartum 11/28/2015  . Anemia, iron deficiency 04/17/2014  . Anemia 08/07/2013  . Dyspepsia 02/02/2013  . Orthostatic hypotension 02/02/2013  . Other malaise and fatigue 02/02/2013  . Overweight 02/02/2013  . Overweight(278.02) 08/03/2012  . Hypothyroidism, acquired, autoimmune   . Thyroiditis, autoimmune   . Thyrotoxicosis with diffuse goiter   . Exophthalmos   . Graves' disease   . 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 Labs reviewed.  Follow up in 4 weeks. FIRST/CF mutation testing/NIPT/QUAD SCREEN/fragile X/Ashkenazi Jewish population testing/Spinal muscular atrophy discussed: ordered. Role of ultrasound in pregnancy discussed; fetal survey: requested.  50% of 15 minute visit spent on counseling and coordination of care.

## 2015-12-26 NOTE — Addendum Note (Signed)
Addended by: Luella CookMCINTYRE, Lurae Hornbrook E on: 12/26/2015 03:32 PM   Modules accepted: Orders

## 2016-01-02 ENCOUNTER — Encounter (HOSPITAL_COMMUNITY): Payer: Self-pay

## 2016-01-02 ENCOUNTER — Ambulatory Visit (HOSPITAL_COMMUNITY)
Admission: RE | Admit: 2016-01-02 | Discharge: 2016-01-02 | Disposition: A | Discharge status: Home / Self Care | Payer: Medicaid Other | Source: Ambulatory Visit | Attending: Certified Nurse Midwife | Admitting: Certified Nurse Midwife

## 2016-01-02 ENCOUNTER — Ambulatory Visit (HOSPITAL_COMMUNITY): Payer: Medicaid Other

## 2016-01-02 ENCOUNTER — Other Ambulatory Visit: Payer: Self-pay | Admitting: Certified Nurse Midwife

## 2016-01-02 DIAGNOSIS — Z36 Encounter for antenatal screening of mother: Secondary | ICD-10-CM | POA: Insufficient documentation

## 2016-01-02 DIAGNOSIS — O0991 Supervision of high risk pregnancy, unspecified, first trimester: Secondary | ICD-10-CM

## 2016-01-02 DIAGNOSIS — Z3A12 12 weeks gestation of pregnancy: Secondary | ICD-10-CM | POA: Diagnosis not present

## 2016-01-03 LAB — MATERNIT21 PLUS CORE+SCA
Chromosome 13: NEGATIVE
Chromosome 18: NEGATIVE
Chromosome 21: NEGATIVE
PDF: 0
Y Chromosome: NOT DETECTED

## 2016-01-06 ENCOUNTER — Other Ambulatory Visit: Payer: Self-pay | Admitting: Certified Nurse Midwife

## 2016-01-13 ENCOUNTER — Other Ambulatory Visit: Payer: Self-pay | Admitting: *Deleted

## 2016-01-13 DIAGNOSIS — E063 Autoimmune thyroiditis: Secondary | ICD-10-CM

## 2016-01-13 MED ORDER — SYNTHROID 175 MCG PO TABS: ORAL_TABLET | ORAL | Status: DC

## 2016-01-23 ENCOUNTER — Ambulatory Visit (INDEPENDENT_AMBULATORY_CARE_PROVIDER_SITE_OTHER): Payer: Medicaid Other | Admitting: Certified Nurse Midwife

## 2016-01-23 VITALS — BP 95/58 | HR 98 | Temp 98.0°F | Wt 155.4 lb

## 2016-01-23 DIAGNOSIS — Z1389 Encounter for screening for other disorder: Secondary | ICD-10-CM | POA: Diagnosis not present

## 2016-01-23 DIAGNOSIS — Z331 Pregnant state, incidental: Secondary | ICD-10-CM

## 2016-01-23 DIAGNOSIS — Z3482 Encounter for supervision of other normal pregnancy, second trimester: Secondary | ICD-10-CM | POA: Diagnosis not present

## 2016-01-23 LAB — POCT URINALYSIS DIPSTICK
Bilirubin, UA: NEGATIVE
GLUCOSE UA: NEGATIVE
Ketones, UA: NEGATIVE
LEUKOCYTES UA: NEGATIVE
NITRITE UA: NEGATIVE
PH UA: 7
RBC UA: NEGATIVE
Spec Grav, UA: 1.01
Urobilinogen, UA: NEGATIVE

## 2016-01-23 MED ORDER — PRIMACARE 30-1-470 MG PO CAPS: 1.0000 | ORAL_CAPSULE | Freq: Every day | ORAL | Status: DC

## 2016-01-23 NOTE — Progress Notes (Signed)
  Subjective:    Grace Wall is a 30 y.o. female being seen today for her obstetrical visit. She is at 8057w0d gestation. Patient reports: backache, heartburn, no bleeding, no contractions, no cramping and no leaking.  Problem List Items Addressed This Visit    None    Visit Diagnoses    Encounter for supervision of other normal pregnancy in second trimester    -  Primary    Relevant Orders    POCT urinalysis dipstick (Completed)      Patient Active Problem List   Diagnosis Date Noted  . Encounter for supervision of high risk pregnancy in first trimester, antepartum 11/28/2015  . Anemia, iron deficiency 04/17/2014  . Anemia 08/07/2013  . Dyspepsia 02/02/2013  . Orthostatic hypotension 02/02/2013  . Other malaise and fatigue 02/02/2013  . Overweight 02/02/2013  . Overweight(278.02) 08/03/2012  . Hypothyroidism, acquired, autoimmune   . Thyroiditis, autoimmune   . Thyrotoxicosis with diffuse goiter   . Exophthalmos   . Graves' disease   . 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 Labs reviewed. New RX for prenatal vitamins.  Follow up in 4 weeks. FIRST/CF mutation testing/NIPT/QUAD SCREEN/fragile X/Ashkenazi Jewish population testing/Spinal muscular atrophy discussed: results reviewed. Role of ultrasound in pregnancy discussed; fetal survey: results reviewed. Amniocentesis discussed: not indicated. 50% of 15 minute visit spent on counseling and coordination of care.

## 2016-01-23 NOTE — Progress Notes (Signed)
I agree with note by NP Student Andrew Brake.  Was present for exam.  R.Trysten Berti CNM 

## 2016-02-04 ENCOUNTER — Ambulatory Visit (INDEPENDENT_AMBULATORY_CARE_PROVIDER_SITE_OTHER): Payer: Medicaid Other | Admitting: "Endocrinology

## 2016-02-04 ENCOUNTER — Encounter: Payer: Self-pay | Admitting: "Endocrinology

## 2016-02-04 VITALS — BP 98/67 | HR 71 | Wt 156.8 lb

## 2016-02-04 DIAGNOSIS — E349 Endocrine disorder, unspecified: Secondary | ICD-10-CM

## 2016-02-04 DIAGNOSIS — E063 Autoimmune thyroiditis: Secondary | ICD-10-CM | POA: Diagnosis not present

## 2016-02-04 DIAGNOSIS — H0520 Unspecified exophthalmos: Secondary | ICD-10-CM

## 2016-02-04 DIAGNOSIS — E038 Other specified hypothyroidism: Secondary | ICD-10-CM | POA: Diagnosis not present

## 2016-02-04 DIAGNOSIS — E049 Nontoxic goiter, unspecified: Secondary | ICD-10-CM

## 2016-02-04 DIAGNOSIS — I950 Idiopathic hypotension: Secondary | ICD-10-CM

## 2016-02-04 DIAGNOSIS — R5383 Other fatigue: Secondary | ICD-10-CM

## 2016-02-04 DIAGNOSIS — R7303 Prediabetes: Secondary | ICD-10-CM | POA: Diagnosis not present

## 2016-02-04 DIAGNOSIS — Z3483 Encounter for supervision of other normal pregnancy, third trimester: Secondary | ICD-10-CM

## 2016-02-04 DIAGNOSIS — Z331 Pregnant state, incidental: Secondary | ICD-10-CM

## 2016-02-04 LAB — GLUCOSE, POCT (MANUAL RESULT ENTRY): POC Glucose: 92 mg/dl (ref 70–99)

## 2016-02-04 LAB — POCT GLYCOSYLATED HEMOGLOBIN (HGB A1C): HEMOGLOBIN A1C: 5.4

## 2016-02-04 LAB — TSH: TSH: 5.85 mIU/L — ABNORMAL HIGH

## 2016-02-04 LAB — T4, FREE: Free T4: 1 ng/dL (ref 0.8–1.8)

## 2016-02-04 LAB — T3, FREE: T3, Free: 1.8 pg/mL — ABNORMAL LOW (ref 2.3–4.2)

## 2016-02-04 MED ORDER — LEVOTHYROXINE SODIUM 200 MCG PO TABS: 200.0000 ug | ORAL_TABLET | Freq: Every day | ORAL | Status: DC

## 2016-02-04 NOTE — Patient Instructions (Signed)
Follow up visit in 6 weeks. Please repeat lab tests one week prior.

## 2016-02-04 NOTE — Progress Notes (Signed)
Subjective:  Patient Name: Grace Wall Date of Birth: 03-07-1986  MRN: [id number]  Grace Wall  presents to the office today for follow-up of her hypothyroidism, Hashimoto's disease, goiter and exophthalmos in the setting of a new pregnancy.  HISTORY OF PRESENT ILLNESS:   Grace Wall is a 30 y.o. [place] young woman. Grace Wall was accompanied by her son.   1. The patient was first referred to me by herself on 05/08/05.   A. She was diagnosed with thyroid problems at age 48. At that point she was hyperthyroid, had a big anterior neck, and had big eyes, so she presumably had Graves' disease. She was placed on Tapazole (methimazole). Tapazole was subsequently stopped at about age 56.  B. About one year later, she became hypothyroid, presumably due to coexistent Hashimoto's disease. She started Synthroid. The Synthroid was changed to generic levothyroxine in 2005. The patient's eyes had gradually improved over time. At that first visit with me, she was feeling fairly well, but was tired a lot. She was also somewhat mentally confused at times.   C. On physical examination she had intermittent proptosis bilaterally. The thyroid gland was 20+ grams in size. The right lobe was within normal limits, but the left lobe was slightly enlarged. Laboratory data showed a TSH of 3.09, free T4 1.22, and free T3 of 2.6. Her TPO antibody level was 1171.1, with normals being less than 60. A thyroid-stimulating immunoglobulin level was 64, with normals being <129. The thyroid binding inhibitory immunoglobulin was 96, with normals being less than 17. Her TSH receptor antibody was 87, with normals being less than 10. 2. Given the high levels of the thyroid receptor antibody, it was unclear how her thyroid tests would evolve. Repeat thyroid hormone tests performed on 05/18/2005 were markedly different. At this time the TSH was 0.032,  free T4 was 1.77, and free T3 was 3.9. At that point it was still unclear whether the fluctuations  in thyroid hormone were due to changes in the forms of levothyroxine made by different companies, or to  changes in thyroid receptor antibody levels, or both. I changed her to brand Synthroid at a dose of 150 mcg per day.   2. During the past 11 years we have followed her for several problems:  A. We've had to adjust her thyroid hormone doses frequently. Her TSH values have ranged from 0.04 - 8.58. Part of these fluctuations have been due to flare ups of Hashimoto's disease. In addition, because she has not had any health insurance during most of this time, she's often had to rely on thyroid hormone samples. Sometimes she's not always had exactly the right thyroid hormone doses. She also appears to have had fluctuations in her thyroid receptor antibodies which have caused changes in her thyroid hormone levels.  Because of her lack of insurance, however, we had not been able to repeat her thyroid receptor antibody testing.  B. She has also had a problem with chronic iron deficiency anemia.   C. She is overweight. In the last 12 months she's had two elevated HbA1c values in the prediabetes zone.  D. She is now pregnant.  3. The patient's last [facility] visit was on 12/05/15.  A. In the interim, she has been healthy. She is taking Synthroid  175 mcg tablets, one tablet per day on weekdays, but only 1/2 tablet per day on Saturdays and Sundays. She takes her Synthroid in the mornings.  B. Her LMP was on 10/10/15 and her EDC is 07/16/16. She  is now taking prenatal vitamins at night. She takes Diclegis (doxylamine succinate) for nausea and vomiting twice daily. She also takes a stool softener at night.   4. Pertinent Review of Systems:  Constitutional: The patient feels "good, better". Her morning sickness is much better. Her body temperature is normal. She sleeps well.    Eyes: Vision is pretty good, but she sometimes needs her glasses for reading. There are no other significant eye complaints. She does not think  that one eye is more prominent than the other.  Neck: The patient has no complaints of anterior neck swelling, soreness, tenderness,  pressure, discomfort, or difficulty swallowing.  Heart: Heart rate increases with exercise or other physical activity. The patient has no complaints of palpitations, irregular heat beats, chest pain, or chest pressure. Gastrointestinal: As above. She is less constipated now after taking a stool softener with her prenatal vitamins. The patient has no complaints of reflux, upset stomach, stomach aches, or diarrhea. Legs: Muscle mass and strength seem normal. There are no complaints of numbness, tingling, burning, or pain. She has had a "little bit of swelling".  Feet: There are no obvious foot problems. There are no complaints of numbness, tingling, burning, or pain. No edema is noted. GYN: As above    PAST MEDICAL, FAMILY, AND SOCIAL HISTORY:  Past Medical History  Diagnosis Date  . Hypothyroidism, acquired, autoimmune   . Thyroiditis, autoimmune   . Thyrotoxicosis with diffuse goiter   . Exophthalmos   . Graves disease   . Graves' disease     Family History  Problem Relation Age of Onset  . Cancer Maternal Aunt   . Thyroid disease Son   . Diabetes Mother   . Hypertension Father      Current outpatient prescriptions:  .  docusate sodium (COLACE) 100 MG capsule, Take 1 capsule (100 mg total) by mouth 2 (two) times daily., Disp: 90 capsule, Rfl: 1 .  Doxylamine-Pyridoxine (DICLEGIS) 10-10 MG TBEC, Take 1 tablet with breakfast and lunch.  Take 2 tablets at bedtime., Disp: 100 tablet, Rfl: 4 .  Pren-Fe-Meth-FA-Omeg w/o A (PRIMACARE) 30-1-470 MG CAPS, Take 1 tablet by mouth daily., Disp: 30 capsule, Rfl: 12 .  SYNTHROID 175 MCG tablet, Take one brand 175 mcg tablet of brand synthroid every morning., Disp: 30 tablet, Rfl: 7 .  Prenatal MV-Min-Fe Fum-FA-DHA (PRENATAL 1 PO), Take by mouth daily. Reported on 02/04/2016, Disp: , Rfl:   Allergies as of  02/04/2016  . (No Known Allergies)    1. 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 She is on "Pregnancy Medicaid". Her mother now lives in the [place]  2. Activities: She stays busy with child care.  3. Smoking, alcohol, or drugs: None 4. Primary Care Provider: None.  5. OB: CNM Samantha Crimes, [facility]  REVIEW OF SYSTEMS: There are no other significant problems involving the patient's other body systems.   Objective:  Vital Signs:  BP 98/67 mmHg  Pulse 71  Wt 156 lb 12.8 oz (71.124 kg)  LMP 10/10/2015  Wt Readings from Last 3 Encounters:  02/04/16 156 lb 12.8 oz (71.124 kg)  01/23/16 155 lb 6.4 oz (70.489 kg)  01/02/16 152 lb 12.8 oz (69.31 kg)   PHYSICAL EXAM: Constitutional: The patient appears healthy, but overweight/obese. She has gained two [pounds since her last visit. She is alert, but still looks a bit tired. She has normal affect and insight today.  Eyes:  There is no  obvious arcus. She has slight, intermittent inferior proptosis of her right eye today, but neither eye appears unusually prominent.  Extraocular movements of her eyes are essentially normal, but upward and lateral movement of the right eye is a bit restricted. She feels no sense of eye muscle pressure when looking upward and left or looking upward and right. Moisture appears normal. Mouth: The oropharynx and tongue appear normal. Oral moisture is normal. There is no hyperpigmentation.  Neck: The neck appears to be visibly normal. No carotid bruits are noted. The thyroid gland is again very mildly enlarged at about 21 grams in size. The right lobe is within normal limits for size. The left lobe is mildly enlarged. The consistency of the thyroid gland is normal. The thyroid gland is not tender to palpation. Lungs: The lungs are clear to auscultation. Air movement is good. Heart: Heart rate and rhythm are regular. Heart sounds S1 and S2 are normal. I heard an  intermittent, soft S4 today. I did not appreciate any pathologic cardiac murmurs or heart sounds. Abdomen: The abdomen is enlarged. Bowel sounds are normal. There is no obvious hepatomegaly, splenomegaly, or other mass effect.  Arms: Muscle size and bulk are normal for age. Hands: There is no obvious tremor. Phalangeal and metacarpophalangeal joints are normal. Palmar muscles are normal for age. Palmar skin is normal. Palmar moisture is also normal. There is no hyperpigmentation.  Legs: Muscles appear normal for age. No edema is present. Neurologic: Strength is normal for age in both the upper and lower extremities. Muscle tone is normal. Sensation to touch is normal in both legs and in the dorsa of both feet.    LAB DATA:   02/04/16: CBG 92 after breakfast, HbA1c 5.4%  02/03/16: TSH 5.85, free T4 1.0, free T3 1.8  12/04/15: TSH 3.52, free T4 1.1, free T3 2.2; iron 51; HbA1c 5.3%  11/28/15: TSH 2.330; CBC normal except for Grace Wall of 26.7 (normal 27-33)  11/18/15: Positive pregnancy test  04/15/15: HbA1c 5.7%; Hgb 11.7, Hct 35.7, iron 36 (normal 40-190); TSH 1.151, free T4 1.08, free T3 2.8  01/10/15: HbA1c 5.9%; Hgb 11.9, Hct 36.3, iron 43; TSH 2.853, free T4 0.83, free T3 2.5  10/15/14: Hgb 11.3, Hct 35.6, MCH 25.6,Iron 24; TSH 2.549, free T4 0.99, free T3 2.4  04/13/14: Iron 24; Hgb 11.1, Hct 32.6%, MCV 74.9; TSH 0.122, free T4 1.45, free T3 3.3  08/04/12: TSH 0.824, free T4 1.26, free T3 3.4  02/01/13: Iron 84, Hgb 11.8, Hct 34.8%  01/30/13; TSH 1.397, free T4 1.44, free T3 2.8  08/02/12: TSH 3.078, free T4 1.14, free T3 2.7  12/15/11: TSH was 0.520, free T4 1.40, free T3  2.7   Assessment and Plan:   ASSESSMENT: 1. Hypothyroid/hyperthyroid:   A. In September 2016 [name] was euthyroid in the middle of the normal range. Her TSH was in her TSH goal range of 1.0-2.0. In May 2017 her TSH was mildly elevated at 3.52, so we increased her Synthroid to 175 mcg/day.   B. This week she is more  hypothyroid. This change in her thyroid hormone status is almost certainly due to the placental deiodinase enzyme that increases during pregnancy and catabolizes the mother's thyroid hormones. We need to increase her Synthroid dose to achieve the TSH goal range of 1.0-2.0.   2. Thyroiditis: Her Hashimoto's disease is clinically quiescent.  3. Goiter: Her thyroid gland is mildly enlarged, but unchanged in size today.  The process of waxing and waning of thyroid  gland size is c/w evolving thyroiditis. 4. Exophthalmos: The eyes are normal in terms of prominence again today, but she does have a small amount of intermittent, inferior proptosis of her right eye and a tiny amount of restriction of upward and lateral gaze of the right eye.   5. Overweight: She has gained two pounds as is expected during pregnancy. She needs to walk an hour a day or equivalent exercise.  6. Fatigue/coldness/iron deficiency anemia:  A.  Her fatigue and sensation of being cold were due partly to iron deficiency anemia and partly to iron deficiency alone.   B. Her recent iron level was normal. Her recent CBC was normal except for a slightly low MCH. She needs to continue her prenatal MVIs.  C. She feels warmer, most likely due to her higher metabolic rate when pregnant.  7. Hypotension: Her BP is good. Her dizziness has resolved.  8. Pre-diabetes: Her HbA1c was in the prediabetic range in June and September 2016, was 5.3% at her last visit, but has increased to 5.4% at this visit. While today's A1c is well within normal limits, I encouraged her to consume fewer carbs and to exercise for an hour per day.  9. Pregnancy: We will need to repeat her TFTs about every 6-8 weeks and see her about every 6-8 weeks during the pregnancy.  PLAN: 1. Diagnostic: Repeat her thyroid tests in 6 weeks.   2. Therapeutic: Increase her Synthroid dose to one 200 mcg pill per day. Take MVI about dinner time. Review the Eat Right Diet. Once she delivers,  will decrease the Synthroid dose to 200 mcg/day for 6 days each week.  3. Patient education: We discussed the fact that we will have to adjust her Synthroid dose about every 6-8 weeks during the pregnancy.  We discussed the effects that calcium and iron have to decrease Synthroid absorption. We discussed the need for her to remain on brand Synthroid. Generic LT4 does not give her steady level of thyroid hormone.  4. Follow-up: 6-8 weeks  Level of Service: This visit lasted in excess of 45 minutes. More than 50% of the visit was devoted to counseling.  David Stall

## 2016-02-06 ENCOUNTER — Telehealth: Payer: Self-pay | Admitting: "Endocrinology

## 2016-02-06 NOTE — Telephone Encounter (Signed)
Script paper faxed to pharmacy.

## 2016-02-20 ENCOUNTER — Encounter: Payer: Medicaid Other | Admitting: Certified Nurse Midwife

## 2016-02-26 ENCOUNTER — Ambulatory Visit (INDEPENDENT_AMBULATORY_CARE_PROVIDER_SITE_OTHER): Payer: Medicaid Other | Admitting: Certified Nurse Midwife

## 2016-02-26 VITALS — BP 105/55 | HR 75 | Wt 158.0 lb

## 2016-02-26 DIAGNOSIS — Z331 Pregnant state, incidental: Secondary | ICD-10-CM | POA: Diagnosis not present

## 2016-02-26 DIAGNOSIS — Z3492 Encounter for supervision of normal pregnancy, unspecified, second trimester: Secondary | ICD-10-CM | POA: Diagnosis not present

## 2016-02-26 DIAGNOSIS — Z1389 Encounter for screening for other disorder: Secondary | ICD-10-CM

## 2016-02-26 DIAGNOSIS — O0992 Supervision of high risk pregnancy, unspecified, second trimester: Secondary | ICD-10-CM

## 2016-02-26 LAB — POCT URINALYSIS DIPSTICK
Bilirubin, UA: NEGATIVE
Glucose, UA: NEGATIVE
KETONES UA: NEGATIVE
Nitrite, UA: NEGATIVE
PH UA: 6
PROTEIN UA: NEGATIVE
SPEC GRAV UA: 1.02
UROBILINOGEN UA: NEGATIVE

## 2016-02-26 NOTE — Progress Notes (Signed)
Subjective:    Grace Wall is a 30 y.o. female being seen today for her obstetrical visit. She is at 1640w6d gestation. Patient reports: no complaints . Fetal movement: normal.  Problem List Items Addressed This Visit    None    Visit Diagnoses    Prenatal care, second trimester    -  Primary   Relevant Orders   POCT urinalysis dipstick (Completed)   Supervision of high risk pregnancy in second trimester       Relevant Orders   AMB referral to maternal fetal medicine   US MFM OB DETAIL +14 WK     Patient Active Problem List   Diagnosis Date Noted  . Encounter for supervision of high risk pregnancy in first trimester, antepartum 11/28/2015  . Anemia, iron deficiency 04/17/2014  . Anemia 08/07/2013  . Dyspepsia 02/02/2013  . Orthostatic hypotension 02/02/2013  . Other malaise and fatigue 02/02/2013  . Overweight 02/02/2013  . Overweight(278.02) 08/03/2012  . Hypothyroidism, acquired, autoimmune   . Thyroiditis, autoimmune   . Thyrotoxicosis with diffuse goiter   . Exophthalmos   . Graves' disease   . 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 Signs and symptoms of preterm labor: discussed.  Labs, problem list reviewed and updated 2 hr GTT planned Follow up in 4 weeks.

## 2016-02-26 NOTE — Addendum Note (Signed)
Addended by: Marya LandryFOSTER, Michaelia Beilfuss D on: 02/26/2016 05:02 PM   Modules accepted: Orders

## 2016-02-27 ENCOUNTER — Encounter: Payer: Self-pay | Admitting: Certified Nurse Midwife

## 2016-02-28 LAB — URINE CULTURE, OB REFLEX

## 2016-02-28 LAB — CULTURE, OB URINE

## 2016-03-05 ENCOUNTER — Ambulatory Visit (HOSPITAL_COMMUNITY)
Admission: RE | Admit: 2016-03-05 | Discharge: 2016-03-05 | Disposition: A | Discharge status: Home / Self Care | Payer: Medicaid Other | Source: Ambulatory Visit | Attending: Certified Nurse Midwife | Admitting: Certified Nurse Midwife

## 2016-03-05 ENCOUNTER — Encounter (HOSPITAL_COMMUNITY): Payer: Self-pay

## 2016-03-05 DIAGNOSIS — Z36 Encounter for antenatal screening of mother: Secondary | ICD-10-CM | POA: Insufficient documentation

## 2016-03-05 DIAGNOSIS — O0992 Supervision of high risk pregnancy, unspecified, second trimester: Secondary | ICD-10-CM

## 2016-03-05 DIAGNOSIS — E079 Disorder of thyroid, unspecified: Secondary | ICD-10-CM | POA: Insufficient documentation

## 2016-03-05 DIAGNOSIS — O99282 Endocrine, nutritional and metabolic diseases complicating pregnancy, second trimester: Secondary | ICD-10-CM | POA: Diagnosis present

## 2016-03-05 DIAGNOSIS — Z3A21 21 weeks gestation of pregnancy: Secondary | ICD-10-CM | POA: Diagnosis not present

## 2016-03-06 ENCOUNTER — Other Ambulatory Visit: Payer: Self-pay | Admitting: Certified Nurse Midwife

## 2016-03-06 DIAGNOSIS — O0991 Supervision of high risk pregnancy, unspecified, first trimester: Secondary | ICD-10-CM

## 2016-03-10 ENCOUNTER — Encounter (HOSPITAL_COMMUNITY): Payer: Self-pay

## 2016-03-10 ENCOUNTER — Ambulatory Visit (HOSPITAL_COMMUNITY)
Admission: RE | Admit: 2016-03-10 | Discharge: 2016-03-10 | Disposition: A | Discharge status: Home / Self Care | Payer: Medicaid Other | Source: Ambulatory Visit | Attending: Obstetrics | Admitting: Obstetrics

## 2016-03-10 VITALS — BP 103/55 | HR 90 | Wt 164.5 lb

## 2016-03-10 DIAGNOSIS — E039 Hypothyroidism, unspecified: Secondary | ICD-10-CM | POA: Diagnosis not present

## 2016-03-10 DIAGNOSIS — E063 Autoimmune thyroiditis: Secondary | ICD-10-CM

## 2016-03-10 DIAGNOSIS — Z3A21 21 weeks gestation of pregnancy: Secondary | ICD-10-CM | POA: Insufficient documentation

## 2016-03-10 DIAGNOSIS — O99012 Anemia complicating pregnancy, second trimester: Secondary | ICD-10-CM | POA: Diagnosis not present

## 2016-03-10 DIAGNOSIS — O99282 Endocrine, nutritional and metabolic diseases complicating pregnancy, second trimester: Secondary | ICD-10-CM | POA: Insufficient documentation

## 2016-03-10 DIAGNOSIS — D509 Iron deficiency anemia, unspecified: Secondary | ICD-10-CM | POA: Diagnosis not present

## 2016-03-10 NOTE — Consult Note (Signed)
Maternal Fetal Medicine Consultation  Requesting Provider(s): Despina Hiddenachel Denny, CNM  Reason for consultation: Autoimmune thyroid disease  HPI: Grace CocoKarla S Dorris is a 30 yo G3P2002, EDD 07/16/2016 who is currently at 21w 5d seen for consultation due to autoimmune thyroid disease/ Hashimoto's thyroiditis.  Ms. Pollie MeyerFonville has been followed by Dr. Fransico MichaelBrennan for over 10 years and has been following her closely during this pregnancy.  She was first diagnosed with thyroid problems at age 89 - was initially hyperthyroid and was treated with methimazole.  She subsequently became hypothyroid - noted to have elevated thyroid receptor antibodies and over the years had fluctuating levels of thyroid hormone.  Prior to pregnancy, she was on Synthroid 150 mcg daily and her synthroid dose was recently increased to 200 mcg daily.  Additionally, she has had an elevated HbA1C value in the "prediabetes" range as well as chronic iron deficiency anemia.  Ms. Valorie RooseveltFonville's past OB history is remarkable for 2 prior term vaginal deliveries without complications.  Her prenatal course thus far has otherwise been uncomplicated.  She is without complaints today.  OB History: OB History    Gravida Para Term Preterm AB Living   3 2 2     2    SAB TAB Ectopic Multiple Live Births           2      PMH:  Past Medical History:  Diagnosis Date  . Exophthalmos   . Graves disease   . Graves' disease   . Hypothyroidism, acquired, autoimmune   . Thyroiditis, autoimmune   . Thyrotoxicosis with diffuse goiter     PSH:  Past Surgical History:  Procedure Laterality Date  . NO PAST SURGERIES     Meds:  Current Outpatient Prescriptions on File Prior to Encounter  Medication Sig Dispense Refill  . docusate sodium (COLACE) 100 MG capsule Take 1 capsule (100 mg total) by mouth 2 (two) times daily. 90 capsule 1  . Doxylamine-Pyridoxine (DICLEGIS) 10-10 MG TBEC Take 1 tablet with breakfast and lunch.  Take 2 tablets at bedtime. 100 tablet 4   . levothyroxine (SYNTHROID) 200 MCG tablet Take 1 tablet (200 mcg total) by mouth daily before breakfast. Take one 200 mcg tablet of brand Synthroid daily. Brand name is medically necessary. 30 tablet 6  . Pren-Fe-Meth-FA-Omeg w/o A (PRIMACARE) 30-1-470 MG CAPS Take 1 tablet by mouth daily. 30 capsule 12  . Prenatal MV-Min-Fe Fum-FA-DHA (PRENATAL 1 PO) Take by mouth daily. Reported on 02/04/2016     No current facility-administered medications on file prior to encounter.    Allergies: No Known Allergies   FH:  Family History  Problem Relation Age of Onset  . Cancer Maternal Aunt   . Thyroid disease Son   . Diabetes Mother   . Hypertension Father    Soc:  Social History   Social History  . Marital status: Married    Spouse name: N/A  . Number of children: N/A  . Years of education: N/A   Occupational History  . Not on file.   Social History Main Topics  . Smoking status: Never Smoker  . Smokeless tobacco: Never Used  . Alcohol use No     Comment: ocassionally  . Drug use: No  . Sexual activity: Yes   Other Topics Concern  . Not on file   Social History Narrative  . No narrative on file    Review of Systems: no vaginal bleeding or cramping/contractions, no LOF, no nausea/vomiting. All other systems reviewed and are  negative.  PE:   Vitals:   03/10/16 1407  BP: (!) 103/55  Pulse: 90    Labs:  02/04/16: HbA1C 5.4% 02/03/16: TSH 5.85, free T4 1.0, free T3 1.8 12/04/15: TSH 3.52, Free T4 1.1, free T3 2.2 11/28/15: TSH 2.33   A/P: 1) Single IUP at 21w 5d  2) Hypothyroidism, Hashimoto's disease - Ms. Pollie MeyerFonville is currently being followed closely by Dr. Fransico MichaelBrennan.  She should have repeat thyroid function tests drawn in approximately 4-5 weeks since her synthroid dose was increased.  She will been monthly TSH, free T4 and free T3 during the course of her pregnancy.  The TSH goal should be 1.0 -2.0.  Discussed the importance of trying to achieve excellent control during  pregnancy - potential risk of neurobehavioral abnormalities in offspring of poorly controlled hypothyroid patients.  3) Elevated HbA1C values in prediabetes range - would recommend early glucose tolerance test and repeat at 28 weeks if normal  Recommendations: 1) Recommend serial ultrasound for growth every 4-6 weeks during pregnancy.  We have scheduled a follow up ultrasound in 6 weeks. 2) If poor fetal growth is noted or hypothyroidism is difficult to control, would also recommend antenatal testing beginning at 32 weeks   Thank you for the opportunity to be a part of the care of Grace Wall. Please contact our office if we can be of further assistance.   I spent approximately 30 minutes with this patient with over 50% of time spent in face-to-face counseling.  Alpha GulaPaul Ladelle Teodoro, MD Maternal Fetal Medicine

## 2016-03-16 ENCOUNTER — Other Ambulatory Visit (HOSPITAL_COMMUNITY): Payer: Self-pay

## 2016-03-17 LAB — T4, FREE: Free T4: 1.2 ng/dL (ref 0.8–1.8)

## 2016-03-17 LAB — TSH: TSH: 3.54 mIU/L

## 2016-03-17 LAB — T3, FREE: T3, Free: 2.2 pg/mL — ABNORMAL LOW (ref 2.3–4.2)

## 2016-03-19 ENCOUNTER — Encounter: Payer: Self-pay | Admitting: "Endocrinology

## 2016-03-19 ENCOUNTER — Ambulatory Visit (INDEPENDENT_AMBULATORY_CARE_PROVIDER_SITE_OTHER): Payer: Medicaid Other | Admitting: "Endocrinology

## 2016-03-19 VITALS — BP 105/65 | HR 82 | Wt 165.4 lb

## 2016-03-19 DIAGNOSIS — E063 Autoimmune thyroiditis: Secondary | ICD-10-CM | POA: Diagnosis not present

## 2016-03-19 DIAGNOSIS — R7303 Prediabetes: Secondary | ICD-10-CM

## 2016-03-19 DIAGNOSIS — I950 Idiopathic hypotension: Secondary | ICD-10-CM | POA: Diagnosis not present

## 2016-03-19 DIAGNOSIS — E049 Nontoxic goiter, unspecified: Secondary | ICD-10-CM | POA: Diagnosis not present

## 2016-03-19 DIAGNOSIS — R5383 Other fatigue: Secondary | ICD-10-CM

## 2016-03-19 DIAGNOSIS — E038 Other specified hypothyroidism: Secondary | ICD-10-CM | POA: Diagnosis not present

## 2016-03-19 DIAGNOSIS — Z331 Pregnant state, incidental: Secondary | ICD-10-CM

## 2016-03-19 DIAGNOSIS — E663 Overweight: Secondary | ICD-10-CM

## 2016-03-19 DIAGNOSIS — H0520 Unspecified exophthalmos: Secondary | ICD-10-CM

## 2016-03-19 DIAGNOSIS — Z3492 Encounter for supervision of normal pregnancy, unspecified, second trimester: Secondary | ICD-10-CM

## 2016-03-19 DIAGNOSIS — E349 Endocrine disorder, unspecified: Secondary | ICD-10-CM

## 2016-03-19 MED ORDER — LEVOTHYROXINE SODIUM 112 MCG PO TABS: ORAL_TABLET | ORAL | 6 refills | Status: DC

## 2016-03-19 NOTE — Progress Notes (Signed)
Subjective:  Patient Name: Grace Wall Date of Birth: 1985-12-07  MRN: [id number]  Grace Wall  presents to the office today for follow-up of her hypothyroidism, Hashimoto's disease, goiter and exophthalmos in the setting of a new pregnancy.  HISTORY OF PRESENT ILLNESS:   Grace Wall is a 30 y.o. [place] young woman. Grace Wall was accompanied by her mother.   1. The patient was first referred to me by herself on 05/08/05.   A. She was diagnosed with thyroid problems at age 8. At that point she was hyperthyroid, had a big anterior neck, and had big eyes, so she presumably had Graves' disease. She was placed on Tapazole (methimazole). Tapazole was subsequently stopped at about age 65.  B. About one year later, she became hypothyroid, presumably due to coexistent Hashimoto's disease. She started Synthroid. The Synthroid was changed to generic levothyroxine in 2005. The patient's eyes had gradually improved over time. At that first visit with me, she was feeling fairly well, but was tired a lot. She was also somewhat mentally confused at times.   C. On physical examination she had intermittent proptosis bilaterally. The thyroid gland was 20+ grams in size. The right lobe was within normal limits, but the left lobe was slightly enlarged. Laboratory data showed a TSH of 3.09, free T4 1.22, and free T3 of 2.6. Her TPO antibody level was 1171.1, with normals being less than 60. A thyroid-stimulating immunoglobulin level was 64, with normals being <129. The thyroid binding inhibitory immunoglobulin was 96, with normals being less than 17. Her TSH receptor antibody was 87, with normals being less than 10. 2. Given the high levels of the thyroid receptor antibody, it was unclear how her thyroid tests would evolve. Repeat thyroid hormone tests performed on 05/18/2005 were markedly different. At this time the TSH was 0.032,  free T4 was 1.77, and free T3 was 3.9. At that point it was still unclear whether the  fluctuations in thyroid hormone were due to changes in the forms of levothyroxine made by different companies, or to  changes in thyroid receptor antibody levels, or both. I changed her to brand Synthroid at a dose of 150 mcg per day.   2. During the past 11 years we have followed her for several problems:  A. We've had to adjust her thyroid hormone doses frequently. Her TSH values have ranged from 0.04 - 8.58. Part of these fluctuations have been due to flare ups of Hashimoto's disease. In addition, because she has not had any health insurance during most of this time, she's often had to rely on thyroid hormone samples. Sometimes she's not always had exactly the right thyroid hormone doses. She also appears to have had fluctuations in her thyroid receptor antibodies which have caused changes in her thyroid hormone levels.  Because of her lack of insurance, however, we had not been able to repeat her thyroid receptor antibody testing.  B. She has also had a problem with chronic iron deficiency anemia.   C. She is overweight. In the last 12 months she's had two elevated HbA1c values in the prediabetes zone.  D. She is now pregnant, so we are following her every 6-8 weeks with lab tests and clinic visits.   3. The patient's last [facility] visit was on 02/04/16.  A. In the interim, she has been healthy. She is taking Synthroid  200 mcg tablets, one tablet per day. She takes her Synthroid in the mornings. She takes her prenatal MVI at night.  B. Her  LMP was on 10/10/15 and her EDC is 07/16/16.  Her pregnancy is going well. She is still taking prenatal vitamins at night. She also takes Diclegis (doxylamine succinate) for nausea and vomiting twice daily. When she tried to stop this medication the nausea and vomiting recurred. She also takes a stool softener twice daily.    4. Pertinent Review of Systems:  Constitutional: The patient feels "good". Her morning sickness is controlled with Diclegis. Her body temperature  is normal. She is not cold anymore. She sleeps well.    Eyes: Vision is pretty good, but she sometimes needs her glasses for reading. There are no other significant eye complaints. She does not think that one eye is more prominent than the other. When she moves her eyes around she does not have any sense of pressure or restriction.  Neck: The patient has no complaints of anterior neck swelling, soreness, tenderness,  pressure, discomfort, or difficulty swallowing.  Heart: Heart rate increases with exercise or other physical activity. The patient has no complaints of palpitations, irregular heat beats, chest pain, or chest pressure. Gastrointestinal: As above. She is less constipated now after taking a stool softener with her prenatal vitamins. The patient has no complaints of reflux, upset stomach, stomach aches, or diarrhea. Legs: Muscle mass and strength seem normal. There are no complaints of numbness, tingling, burning, or pain. She has not had swelling recently.   Feet: There are no obvious foot problems. There are no complaints of numbness, tingling, burning, or pain. No edema is noted. GYN: As above    PAST MEDICAL, FAMILY, AND SOCIAL HISTORY:  Past Medical History:  Diagnosis Date  . Exophthalmos   . Graves disease   . Graves' disease   . Hypothyroidism, acquired, autoimmune   . Thyroiditis, autoimmune   . Thyrotoxicosis with diffuse goiter     Family History  Problem Relation Age of Onset  . Cancer Maternal Aunt   . Thyroid disease Son   . Diabetes Mother   . Hypertension Father      Current Outpatient Prescriptions:  .  docusate sodium (COLACE) 100 MG capsule, Take 1 capsule (100 mg total) by mouth 2 (two) times daily., Disp: 90 capsule, Rfl: 1 .  Doxylamine-Pyridoxine (DICLEGIS) 10-10 MG TBEC, Take 1 tablet with breakfast and lunch.  Take 2 tablets at bedtime., Disp: 100 tablet, Rfl: 4 .  levothyroxine (SYNTHROID) 200 MCG tablet, Take 1 tablet (200 mcg total) by mouth  daily before breakfast. Take one 200 mcg tablet of brand Synthroid daily. Brand name is medically necessary., Disp: 30 tablet, Rfl: 6 .  Pren-Fe-Meth-FA-Omeg w/o A (PRIMACARE) 30-1-470 MG CAPS, Take 1 tablet by mouth daily., Disp: 30 capsule, Rfl: 12 .  Prenatal MV-Min-Fe Fum-FA-DHA (PRENATAL 1 PO), Take by mouth daily. Reported on 02/04/2016, Disp: , Rfl:   Allergies as of 03/19/2016  . (No Known Allergies)    1. 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 She is on "Pregnancy Medicaid". Her mother now lives in the [place]  2. Activities: She stays busy with child care.  3. Smoking, alcohol, or drugs: None 4. Primary Care Provider: None.  5. OB: CNM Samantha Crimesachelle Anne Denney, [facility]  REVIEW OF SYSTEMS: There are no other significant problems involving the patient's other body systems.   Objective:  Vital Signs:  BP 105/65   Pulse 82   Wt 165 lb 6.4 oz (75 kg)   LMP 10/10/2015   BMI 27.52 kg/m  Wt Readings from Last 3 Encounters:  03/19/16 165 lb 6.4 oz (75 kg)  03/10/16 164 lb 8 oz (74.6 kg)  03/05/16 164 lb 9.6 oz (74.7 kg)   PHYSICAL EXAM: Constitutional: The patient appears healthy, but overweight/obese. She has gained nine pounds since her last visit. She is alert and looks great. She has normal affect and insight today.  Eyes:  There is no obvious arcus. Her right eye is a bit more prominent than the left, but neither eye appears unusually prominent. She has very slight, intermittent inferior proptosis of her right eye today,  Extraocular movements of her eyes are essentially normal, but upward and lateral movement of the right eye is a bit restricted. She feels no sense of eye muscle pressure when looking upward and left or looking upward and right. Moisture appears normal. Mouth: The oropharynx and tongue appear normal. Oral moisture is normal. There is no hyperpigmentation.  Neck: The neck appears to be visibly normal. No carotid  bruits are noted. The thyroid gland is smaller and only minimally enlarged at about 20-21 grams in size. The right lobe is within normal limits for size. The left lobe is very mildly enlarged. The consistency of the thyroid gland is normal. The thyroid gland is not tender to palpation. Lungs: The lungs are clear to auscultation. Air movement is good. Heart: Heart rate and rhythm are regular. Heart sounds S1 and S2 are normal. I heard an intermittent, soft S4 today. I did not appreciate any pathologic cardiac murmurs or heart sounds. Abdomen: The abdomen is enlarged as expected at this stage of her pregnancy. Bowel sounds are normal. There is no obvious hepatomegaly, splenomegaly, or other mass effect.  Arms: Muscle size and bulk are normal for age. Hands: There is no obvious tremor. Phalangeal and metacarpophalangeal joints are normal. Palmar muscles are normal for age. Palmar skin is normal. Palmar moisture is also normal. There is no hyperpigmentation.  Legs: Muscles appear normal for age. No edema is present. Neurologic: Strength is normal for age in both the upper and lower extremities. Muscle tone is normal. Sensation to touch is normal in both legs.    LAB DATA:   8//28/17: TSH 3.54, free T4 1.2, free T3 2.2; HbA1c 5.4%  02/04/16: CBG 92 after breakfast, HbA1c 5.4%  02/03/16: TSH 5.85, free T4 1.0, free T3 1.8  12/04/15: TSH 3.52, free T4 1.1, free T3 2.2; iron 51; HbA1c 5.3%  11/28/15: TSH 2.330; CBC normal except for [facility] of 26.7 (normal 27-33)  11/18/15: Positive pregnancy test  04/15/15: HbA1c 5.7%; Hgb 11.7, Hct 35.7, iron 36 (normal 40-190); TSH 1.151, free T4 1.08, free T3 2.8  01/10/15: HbA1c 5.9%; Hgb 11.9, Hct 36.3, iron 43; TSH 2.853, free T4 0.83, free T3 2.5  10/15/14: Hgb 11.3, Hct 35.6, MCH 25.6,Iron 24; TSH 2.549, free T4 0.99, free T3 2.4  04/13/14: Iron 24; Hgb 11.1, Hct 32.6%, MCV 74.9; TSH 0.122, free T4 1.45, free T3 3.3  08/04/12: TSH 0.824, free T4 1.26, free T3  3.4  02/01/13: Iron 84, Hgb 11.8, Hct 34.8%  01/30/13; TSH 1.397, free T4 1.44, free T3 2.8  08/02/12: TSH 3.078, free T4 1.14, free T3 2.7  12/15/11: TSH was 0.520, free T4 1.40, free T3  2.7   Assessment and Plan:   ASSESSMENT: 1. Hypothyroid/hyperthyroid:   A. In September 2016 Grace Wall was euthyroid in the middle of the normal range. Her TSH was in her TSH goal range of 1.0-2.0. In May 2017 her TSH  was mildly elevated at 3.52, so we increased her Synthroid dose to 175 mcg/day. At her last visit in July she was more hypothyroid, so I increased her Synthroid dose to 200 mcg/day.   B. This week she is still hypothyroid chemically, but better. She is clinically euthyroid. She will need an increase in her Synthroid dose to compensate for her increased placental deiodinase activity. We need to increase her Synthroid dose to achieve the TSH goal range of 1.0-2.0.   2. Thyroiditis: Her Hashimoto's disease is clinically quiescent.  3. Goiter: Her thyroid gland is mildly enlarged, but smaller today. The process of waxing and waning of thyroid gland size is c/w evolving thyroiditis, but also due to the higher levels of HCG that she had at her last visit. . 4. Exophthalmos: The eyes are essentially within normal limits in terms of prominence again today, but her right eye is still relatively prominent and she still has a small amount of intermittent, inferior proptosis of her right eye and a tiny amount of restriction of upward and lateral gaze of the right eye.   5. Overweight: She has gained 9 more pounds as is expected during pregnancy. She needs to walk an hour a day or equivalent exercise.  6. Fatigue/coldness/iron deficiency anemia:  A.  Her fatigue and sensation of being cold were due partly to iron deficiency anemia and partly to iron deficiency alone.   B. Her recent iron level was normal. Her recent CBC was normal except for a slightly low MCH. She needs to continue her prenatal MVIs.  C. She  feels warmer, most likely due to her higher metabolic rate when pregnant.  7. Hypotension: Her BP is good. Her dizziness has resolved.  8. Pre-diabetes: Her HbA1c was in the prediabetic range in June and September 2016, was 5.3% at her last visit, but had increased to 5.4% at her visit in July. While that latter A1c was well within normal limits, I encouraged her to consume fewer carbs and to exercise for an hour per day.  9. Pregnancy, normal, supervision: We will need to repeat her TFTs about every 6-8 weeks and see her about every 6-8 weeks during the pregnancy.  PLAN: 1. Diagnostic: Repeat her thyroid tests in 6 weeks.   2. Therapeutic: Increase her Synthroid dose to two 112 mcg pills per day. Take MVI about dinner time. Review the Eat Right Diet. Once she delivers, will decrease the Synthroid dose to 200 mcg/day for 6 days each week.  3. Patient education: We discussed the fact that we will have to adjust her Synthroid dose about every 6-8 weeks during the pregnancy.  We discussed the effects that calcium and iron have to decrease Synthroid absorption, so we need to take those medications about 12 hours apart from the Synthroid doses. We discussed the need for her to remain on brand Synthroid. Generic LT4 does not give her steady level of thyroid hormone.  4. Follow-up: 6-8 weeks  Level of Service: This visit lasted in excess of 45 minutes. More than 50% of the visit was devoted to counseling.  David Stall, MD, CDE Adult and Pediatric Endocrinology

## 2016-03-19 NOTE — Patient Instructions (Addendum)
Follow up visit in 8 weeks . Please repeat lab tests 2 weeks prior.

## 2016-03-25 ENCOUNTER — Encounter: Payer: Medicaid Other | Admitting: Certified Nurse Midwife

## 2016-03-25 ENCOUNTER — Encounter: Payer: Self-pay | Admitting: Certified Nurse Midwife

## 2016-03-30 ENCOUNTER — Ambulatory Visit (INDEPENDENT_AMBULATORY_CARE_PROVIDER_SITE_OTHER): Payer: Medicaid Other | Admitting: Obstetrics & Gynecology

## 2016-03-30 DIAGNOSIS — Z3492 Encounter for supervision of normal pregnancy, unspecified, second trimester: Secondary | ICD-10-CM | POA: Diagnosis not present

## 2016-03-30 NOTE — Progress Notes (Signed)
.     PRENATAL VISIT NOTE  Subjective:  Grace Wall is a 30 y.o. G3P2002 at [redacted]w[redacted]d being seen today for ongoing prenatal care.  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  Patient reports no complaints.  Contractions: Not present. Vag. Bleeding: None.  Movement: Present. Denies leaking of fluid.   The following portions of the patient's history were reviewed and updated as appropriate: allergies, current medications, past family history, past medical history, past social history, past surgical history and problem list. Problem list updated.  Objective:   Vitals:   03/30/16 0821  BP: 101/62  Pulse: 77  Temp: 98 F (36.7 C)  Weight: 76.2 kg (168 lb 1.6 oz)    Fetal Status: Fetal Heart Rate (bpm): 150 Fundal Height: 25 cm Movement: Present     General:  Alert, oriented and cooperative. Patient is in no acute distress.  Skin: Skin is warm and dry. No rash noted.   Cardiovascular: Normal heart rate noted  Respiratory: Normal respiratory effort, no problems with respiration noted  Abdomen: Soft, gravid, appropriate for gestational age. Pain/Pressure: Absent     Pelvic:  Cervical exam deferred        Extremities: Normal range of motion.  Edema: None  Mental Status: Normal mood and affect. Normal behavior. Normal judgment and thought content.   Urinalysis:      Assessment and Plan:  Pregnancy: G3P2002 at 3168w4d  There are no diagnoses linked to this encounter. Preterm labor symptoms  and general obstetric precautions including but not limited to vaginal bleeding, contractions, leaking of fluid and fetal movement were reviewed in detail with the patient. Please refer to After Visit Summary for other counseling recommendations.  Return in about 4 weeks (around 04/27/2016) for one hr GTT.  Adam PhenixJames G Deairra Halleck, MD

## 2016-03-30 NOTE — Progress Notes (Signed)
Pt. Denies questions or concerns at this time. 

## 2016-03-30 NOTE — Patient Instructions (Signed)

## 2016-04-21 ENCOUNTER — Other Ambulatory Visit (HOSPITAL_COMMUNITY): Payer: Self-pay | Admitting: Maternal and Fetal Medicine

## 2016-04-21 ENCOUNTER — Encounter (HOSPITAL_COMMUNITY): Payer: Self-pay

## 2016-04-21 ENCOUNTER — Ambulatory Visit (HOSPITAL_COMMUNITY)
Admission: RE | Admit: 2016-04-21 | Discharge: 2016-04-21 | Disposition: A | Discharge status: Home / Self Care | Payer: Medicaid Other | Source: Ambulatory Visit | Attending: Certified Nurse Midwife | Admitting: Certified Nurse Midwife

## 2016-04-21 DIAGNOSIS — E079 Disorder of thyroid, unspecified: Secondary | ICD-10-CM

## 2016-04-21 DIAGNOSIS — Z3A27 27 weeks gestation of pregnancy: Secondary | ICD-10-CM

## 2016-04-21 DIAGNOSIS — O99282 Endocrine, nutritional and metabolic diseases complicating pregnancy, second trimester: Secondary | ICD-10-CM | POA: Diagnosis present

## 2016-04-21 DIAGNOSIS — E063 Autoimmune thyroiditis: Secondary | ICD-10-CM

## 2016-04-22 ENCOUNTER — Other Ambulatory Visit (HOSPITAL_COMMUNITY): Payer: Self-pay | Admitting: *Deleted

## 2016-04-22 DIAGNOSIS — E039 Hypothyroidism, unspecified: Secondary | ICD-10-CM

## 2016-04-22 DIAGNOSIS — O99280 Endocrine, nutritional and metabolic diseases complicating pregnancy, unspecified trimester: Principal | ICD-10-CM

## 2016-04-27 ENCOUNTER — Ambulatory Visit (INDEPENDENT_AMBULATORY_CARE_PROVIDER_SITE_OTHER): Payer: Medicaid Other | Admitting: Obstetrics and Gynecology

## 2016-04-27 VITALS — BP 100/66 | HR 85 | Wt 172.0 lb

## 2016-04-27 DIAGNOSIS — Z3493 Encounter for supervision of normal pregnancy, unspecified, third trimester: Secondary | ICD-10-CM

## 2016-04-27 DIAGNOSIS — E063 Autoimmune thyroiditis: Secondary | ICD-10-CM

## 2016-04-27 DIAGNOSIS — O99283 Endocrine, nutritional and metabolic diseases complicating pregnancy, third trimester: Secondary | ICD-10-CM

## 2016-04-27 MED ORDER — FLUCONAZOLE 150 MG PO TABS: 150.0000 mg | ORAL_TABLET | Freq: Once | ORAL | 0 refills | Status: AC

## 2016-04-27 NOTE — Progress Notes (Signed)
   PRENATAL VISIT NOTE  Subjective:  Grace Wall is a 30 y.o. G3P2002 at 3344w4d being seen today for ongoing prenatal care.  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  Patient reports pruritic vaginal discharge.  Contractions: Not present. Vag. Bleeding: None.  Movement: Present. Denies leaking of fluid.   The following portions of the patient's history were reviewed and updated as appropriate: allergies, current medications, past family history, past medical history, past social history, past surgical history and problem list. Problem list updated.  Objective:   Vitals:   04/27/16 0817  BP: 100/66  Pulse: 85  Weight: 172 lb (78 kg)    Fetal Status: Fetal Heart Rate (bpm): 150   Movement: Present     General:  Alert, oriented and cooperative. Patient is in no acute distress.  Skin: Skin is warm and dry. No rash noted.   Cardiovascular: Normal heart rate noted  Respiratory: Normal respiratory effort, no problems with respiration noted  Abdomen: Soft, gravid, appropriate for gestational age. Pain/Pressure: Absent     Pelvic:  Cervical exam deferred        Extremities: Normal range of motion.  Edema: None  Mental Status: Normal mood and affect. Normal behavior. Normal judgment and thought content.   Urinalysis: Urine Protein: Trace Urine Glucose: Negative  Assessment and Plan:  Pregnancy: G3P2002 at 9044w4d  1. Encounter for supervision of low-risk pregnancy in third trimester Patient is doing well. She declined pelvic exam  today Rx Diflucan provided Patient will return fasting prior to her next appointment for 2 hr glucola Offer flu and Tdap vaccine at next visit Follow up AFI in November. Recent ultrasound report reviewed with the patient  2. Thyroiditis, autoimmune Patient is scheduled to see her endocrinologist in a few weeks  Preterm labor symptoms and general obstetric precautions including but not limited to vaginal bleeding, contractions, leaking of fluid and fetal movement were reviewed in detail with the patient. Please refer to After Visit Summary for other counseling recommendations.  Return in about 2 weeks (around 05/11/2016) for ROB.  Catalina AntiguaPeggy Cassandr Cederberg, MD

## 2016-04-29 ENCOUNTER — Other Ambulatory Visit: Payer: Medicaid Other

## 2016-04-29 DIAGNOSIS — Z3493 Encounter for supervision of normal pregnancy, unspecified, third trimester: Secondary | ICD-10-CM

## 2016-04-30 LAB — CBC
Hematocrit: 32.9 % — ABNORMAL LOW (ref 34.0–46.6)
Hemoglobin: 10.7 g/dL — ABNORMAL LOW (ref 11.1–15.9)
MCH: 29.9 pg (ref 26.6–33.0)
MCHC: 32.5 g/dL (ref 31.5–35.7)
MCV: 92 fL (ref 79–97)
PLATELETS: 129 10*3/uL — AB (ref 150–379)
RBC: 3.58 x10E6/uL — ABNORMAL LOW (ref 3.77–5.28)
RDW: 14.1 % (ref 12.3–15.4)
WBC: 6.6 10*3/uL (ref 3.4–10.8)

## 2016-04-30 LAB — GLUCOSE TOLERANCE, 2 HOURS W/ 1HR
GLUCOSE, FASTING: 77 mg/dL (ref 65–91)
Glucose, 1 hour: 112 mg/dL (ref 65–179)
Glucose, 2 hour: 124 mg/dL (ref 65–152)

## 2016-04-30 LAB — HIV ANTIBODY (ROUTINE TESTING W REFLEX): HIV Screen 4th Generation wRfx: NONREACTIVE

## 2016-04-30 LAB — RPR: RPR Ser Ql: NONREACTIVE

## 2016-05-07 ENCOUNTER — Other Ambulatory Visit (INDEPENDENT_AMBULATORY_CARE_PROVIDER_SITE_OTHER): Payer: Self-pay | Admitting: "Endocrinology

## 2016-05-08 LAB — T4, FREE: Free T4: 1.1 ng/dL (ref 0.8–1.8)

## 2016-05-08 LAB — TSH: TSH: 0.47 mIU/L

## 2016-05-08 LAB — T3, FREE: T3 FREE: 2.5 pg/mL (ref 2.3–4.2)

## 2016-05-11 ENCOUNTER — Ambulatory Visit (INDEPENDENT_AMBULATORY_CARE_PROVIDER_SITE_OTHER): Payer: Medicaid Other | Admitting: Obstetrics and Gynecology

## 2016-05-11 VITALS — BP 98/63 | HR 79 | Temp 98.4°F | Wt 174.4 lb

## 2016-05-11 DIAGNOSIS — E038 Other specified hypothyroidism: Secondary | ICD-10-CM

## 2016-05-11 DIAGNOSIS — O99283 Endocrine, nutritional and metabolic diseases complicating pregnancy, third trimester: Secondary | ICD-10-CM

## 2016-05-11 DIAGNOSIS — E063 Autoimmune thyroiditis: Secondary | ICD-10-CM

## 2016-05-11 DIAGNOSIS — Z3493 Encounter for supervision of normal pregnancy, unspecified, third trimester: Secondary | ICD-10-CM

## 2016-05-11 DIAGNOSIS — Z23 Encounter for immunization: Secondary | ICD-10-CM | POA: Diagnosis not present

## 2016-05-11 MED ORDER — TETANUS-DIPHTH-ACELL PERTUSSIS 5-2.5-18.5 LF-MCG/0.5 IM SUSP
0.5000 mL | Freq: Once | INTRAMUSCULAR | Status: AC
  Administered 2016-05-11: 0.5 mL via INTRAMUSCULAR

## 2016-05-11 NOTE — Progress Notes (Signed)
Patient states that she is overall feeling good, reports good fetal movement. 

## 2016-05-11 NOTE — Addendum Note (Signed)
Addended by: Natale MilchSTALLING, BRITTANY D on: 05/11/2016 11:40 AM   Modules accepted: Orders

## 2016-05-11 NOTE — Progress Notes (Signed)
   PRENATAL VISIT NOTE  Subjective:  Grace Wall is a 30 y.o. G3P2002 at 3468w4d being seen today for ongoing prenatal care.  She is currently monitored for the following issues for this high-risk pregnancy and has Hypothyroidism, acquired, autoimmune; Thyrotoxicosis with diffuse goiter; Exophthalmos; Graves' disease; Overweight(278.02); Dyspepsia; Overweight; Anemia, iron deficiency; and Supervision of low-risk pregnancy on her problem list.  Patient reports no complaints.  Contractions: Not present. Vag. Bleeding: None.  Movement: Present. Denies leaking of fluid.   The following portions of the patient's history were reviewed and updated as appropriate: allergies, current medications, past family history, past medical history, past social history, past surgical history and problem list. Problem list updated.  Objective:   Vitals:   05/11/16 1114  BP: 98/63  Pulse: 79  Temp: 98.4 F (36.9 C)  Weight: 174 lb 6.4 oz (79.1 kg)    Fetal Status:     Movement: Present     General:  Alert, oriented and cooperative. Patient is in no acute distress.  Skin: Skin is warm and dry. No rash noted.   Cardiovascular: Normal heart rate noted  Respiratory: Normal respiratory effort, no problems with respiration noted  Abdomen: Soft, gravid, appropriate for gestational age. Pain/Pressure: Present     Pelvic:  Cervical exam deferred        Extremities: Normal range of motion.  Edema: None  Mental Status: Normal mood and affect. Normal behavior. Normal judgment and thought content.   Assessment and Plan:  Pregnancy: G3P2002 at 5968w4d  1. Encounter for supervision of low-risk pregnancy in third trimester Patient is doing well without complaints Continue daily synthoid. Follow up with endocrinologist on Thursday Patient agrees to flu and Tdap vaccine today Follow up growth ultrasound on 11/4  Preterm labor symptoms and general obstetric precautions including but not limited to vaginal bleeding,  contractions, leaking of fluid and fetal movement were reviewed in detail with the patient. Please refer to After Visit Summary for other counseling recommendations.  No Follow-up on file.  Catalina AntiguaPeggy Loida Calamia, MD

## 2016-05-15 ENCOUNTER — Ambulatory Visit (INDEPENDENT_AMBULATORY_CARE_PROVIDER_SITE_OTHER): Payer: Self-pay | Admitting: "Endocrinology

## 2016-05-25 ENCOUNTER — Ambulatory Visit (INDEPENDENT_AMBULATORY_CARE_PROVIDER_SITE_OTHER): Payer: Medicaid Other | Admitting: Obstetrics & Gynecology

## 2016-05-25 VITALS — BP 103/61 | HR 84 | Temp 97.0°F | Wt 176.4 lb

## 2016-05-25 DIAGNOSIS — E063 Autoimmune thyroiditis: Secondary | ICD-10-CM

## 2016-05-25 DIAGNOSIS — Z3493 Encounter for supervision of normal pregnancy, unspecified, third trimester: Secondary | ICD-10-CM

## 2016-05-25 DIAGNOSIS — O99283 Endocrine, nutritional and metabolic diseases complicating pregnancy, third trimester: Secondary | ICD-10-CM

## 2016-05-25 DIAGNOSIS — E038 Other specified hypothyroidism: Secondary | ICD-10-CM

## 2016-05-25 NOTE — Progress Notes (Signed)
   PRENATAL VISIT NOTE  Subjective:  Grace Wall is a 30 y.o. G3P2002 at 1947w4d being seen today for ongoing prenatal care.  She is currently monitored for the following issues for this low-risk pregnancy and has Hypothyroidism, acquired, autoimmune; Overweight(278.02); Dyspepsia; Overweight; Anemia, iron deficiency; and Supervision of low-risk pregnancy on her problem list.  Patient reports occasional contractions.  Contractions: Irregular. Vag. Bleeding: None.  Movement: Present. Denies leaking of fluid.   The following portions of the patient's history were reviewed and updated as appropriate: allergies, current medications, past family history, past medical history, past social history, past surgical history and problem list. Problem list updated.  Objective:   Vitals:   05/25/16 0842  BP: 103/61  Pulse: 84  Temp: 97 F (36.1 C)  Weight: 176 lb 6.4 oz (80 kg)    Fetal Status: Fetal Heart Rate (bpm): 153 Fundal Height: 33 cm Movement: Present     General:  Alert, oriented and cooperative. Patient is in no acute distress.  Skin: Skin is warm and dry. No rash noted.   Cardiovascular: Normal heart rate noted  Respiratory: Normal respiratory effort, no problems with respiration noted  Abdomen: Soft, gravid, appropriate for gestational age. Pain/Pressure: Absent     Pelvic:  Cervical exam deferred        Extremities: Normal range of motion.  Edema: None  Mental Status: Normal mood and affect. Normal behavior. Normal judgment and thought content.   Assessment and Plan:  Pregnancy: G3P2002 at 7247w4d  1. Hypothyroidism, acquired, autoimmune Had history of Graves years ago, currently hypothyroid on Synthroid.Followed by Dr. Fransico MichaelBrennan (Endocrinology). Thyroid levels are basically euthyroid as per patient. No need for antenatal testing.   2. Encounter for supervision of low-risk pregnancy in third trimester Preterm labor symptoms and general obstetric precautions including but not  limited to vaginal bleeding, contractions, leaking of fluid and fetal movement were reviewed in detail with the patient. Please refer to After Visit Summary for other counseling recommendations.  Return in about 2 weeks (around 06/08/2016) for OB Visit.   Tereso NewcomerUgonna A Shareef Eddinger, MD

## 2016-05-25 NOTE — Patient Instructions (Signed)
Return to clinic for any scheduled appointments or obstetric concerns, or go to [facility] for evaluation  

## 2016-05-25 NOTE — Progress Notes (Signed)
Patient states that she feels irregular contractions that come and go, pt says they do not hurt, reports good fetal movement, denies pressure and bleeding.

## 2016-06-02 ENCOUNTER — Other Ambulatory Visit (HOSPITAL_COMMUNITY): Payer: Self-pay | Admitting: Maternal and Fetal Medicine

## 2016-06-02 ENCOUNTER — Ambulatory Visit (HOSPITAL_COMMUNITY)
Admission: RE | Admit: 2016-06-02 | Discharge: 2016-06-02 | Disposition: A | Discharge status: Home / Self Care | Payer: Medicaid Other | Source: Ambulatory Visit | Attending: Certified Nurse Midwife | Admitting: Certified Nurse Midwife

## 2016-06-02 ENCOUNTER — Encounter (HOSPITAL_COMMUNITY): Payer: Self-pay

## 2016-06-02 DIAGNOSIS — Z3A33 33 weeks gestation of pregnancy: Secondary | ICD-10-CM | POA: Insufficient documentation

## 2016-06-02 DIAGNOSIS — O99280 Endocrine, nutritional and metabolic diseases complicating pregnancy, unspecified trimester: Principal | ICD-10-CM

## 2016-06-02 DIAGNOSIS — E039 Hypothyroidism, unspecified: Secondary | ICD-10-CM

## 2016-06-02 DIAGNOSIS — E079 Disorder of thyroid, unspecified: Secondary | ICD-10-CM | POA: Diagnosis not present

## 2016-06-02 DIAGNOSIS — O403XX1 Polyhydramnios, third trimester, fetus 1: Secondary | ICD-10-CM

## 2016-06-02 DIAGNOSIS — O99283 Endocrine, nutritional and metabolic diseases complicating pregnancy, third trimester: Secondary | ICD-10-CM | POA: Insufficient documentation

## 2016-06-02 DIAGNOSIS — O403XX0 Polyhydramnios, third trimester, not applicable or unspecified: Secondary | ICD-10-CM | POA: Diagnosis present

## 2016-06-09 ENCOUNTER — Ambulatory Visit (INDEPENDENT_AMBULATORY_CARE_PROVIDER_SITE_OTHER): Payer: Medicaid Other | Admitting: Obstetrics & Gynecology

## 2016-06-09 ENCOUNTER — Encounter: Payer: Self-pay | Admitting: Obstetrics & Gynecology

## 2016-06-09 VITALS — BP 100/59 | HR 98 | Temp 98.0°F | Wt 174.4 lb

## 2016-06-09 DIAGNOSIS — O99283 Endocrine, nutritional and metabolic diseases complicating pregnancy, third trimester: Secondary | ICD-10-CM

## 2016-06-09 DIAGNOSIS — Z3493 Encounter for supervision of normal pregnancy, unspecified, third trimester: Secondary | ICD-10-CM

## 2016-06-09 DIAGNOSIS — R1013 Epigastric pain: Secondary | ICD-10-CM

## 2016-06-09 DIAGNOSIS — E063 Autoimmune thyroiditis: Secondary | ICD-10-CM

## 2016-06-09 DIAGNOSIS — E038 Other specified hypothyroidism: Secondary | ICD-10-CM

## 2016-06-09 MED ORDER — FAMOTIDINE 40 MG PO TABS: 40.0000 mg | ORAL_TABLET | Freq: Every day | ORAL | 1 refills | Status: DC

## 2016-06-09 NOTE — Progress Notes (Signed)
   PRENATAL VISIT NOTE  Subjective:  Grace Wall is a 30 y.o. G3P2002 at 4711w5d being seen today for ongoing prenatal care.  She is currently monitored for the following issues for this low-risk pregnancy and has Hypothyroidism, acquired, autoimmune; Overweight(278.02); Dyspepsia; Overweight; Anemia, iron deficiency; and Supervision of low-risk pregnancy on her problem list.  Patient reports heartburn and nausea.  Contractions: Irregular. Vag. Bleeding: None.  Movement: Present. Denies leaking of fluid.   The following portions of the patient's history were reviewed and updated as appropriate: allergies, current medications, past family history, past medical history, past social history, past surgical history and problem list. Problem list updated.  Objective:   Vitals:   06/09/16 1043  BP: (!) 100/59  Pulse: 98  Temp: 98 F (36.7 C)  Weight: 174 lb 6.4 oz (79.1 kg)    Fetal Status: Fetal Heart Rate (bpm): 147   Movement: Present     General:  Alert, oriented and cooperative. Patient is in no acute distress.  Skin: Skin is warm and dry. No rash noted.   Cardiovascular: Normal heart rate noted  Respiratory: Normal respiratory effort, no problems with respiration noted  Abdomen: Soft, gravid, appropriate for gestational age. Pain/Pressure: Present     Pelvic:  Cervical exam deferred        Extremities: Normal range of motion.  Edema: None  Mental Status: Normal mood and affect. Normal behavior. Normal judgment and thought content.   Assessment and Plan:  Pregnancy: [id number] at 4611w5d  Patient Active Problem List   Diagnosis Date Noted  . Supervision of low-risk pregnancy 11/28/2015  . Anemia, iron deficiency 04/17/2014  . Dyspepsia 02/02/2013  . Overweight 02/02/2013  . Overweight(278.02) 08/03/2012  . Hypothyroidism, acquired, autoimmune     Preterm labor symptoms and general obstetric precautions including but not limited to vaginal bleeding, contractions, leaking of  fluid and fetal movement were reviewed in detail with the patient. Please refer to After Visit Summary for other counseling recommendations.  Return in about 2 weeks (around 06/23/2016).   Adam PhenixJames G Stevens Magwood, MD

## 2016-06-09 NOTE — Progress Notes (Signed)
Patient states contractions come and go, no bleeding, reports good fetal movement.

## 2016-06-15 ENCOUNTER — Other Ambulatory Visit: Payer: Self-pay | Admitting: Obstetrics

## 2016-06-15 ENCOUNTER — Other Ambulatory Visit: Payer: Self-pay | Admitting: Certified Nurse Midwife

## 2016-06-15 DIAGNOSIS — O219 Vomiting of pregnancy, unspecified: Secondary | ICD-10-CM

## 2016-06-16 LAB — T3, FREE: T3 FREE: 2.3 pg/mL (ref 2.3–4.2)

## 2016-06-16 LAB — TSH: TSH: 0.26 mIU/L — ABNORMAL LOW

## 2016-06-16 LAB — T4, FREE: Free T4: 1.2 ng/dL (ref 0.8–1.8)

## 2016-06-17 ENCOUNTER — Encounter (INDEPENDENT_AMBULATORY_CARE_PROVIDER_SITE_OTHER): Payer: Self-pay | Admitting: "Endocrinology

## 2016-06-17 ENCOUNTER — Ambulatory Visit (INDEPENDENT_AMBULATORY_CARE_PROVIDER_SITE_OTHER): Payer: Medicaid Other | Admitting: "Endocrinology

## 2016-06-17 VITALS — BP 100/60 | HR 106 | Wt 177.4 lb

## 2016-06-17 DIAGNOSIS — R7303 Prediabetes: Secondary | ICD-10-CM

## 2016-06-17 DIAGNOSIS — E038 Other specified hypothyroidism: Secondary | ICD-10-CM

## 2016-06-17 DIAGNOSIS — I9589 Other hypotension: Secondary | ICD-10-CM | POA: Diagnosis not present

## 2016-06-17 DIAGNOSIS — E349 Endocrine disorder, unspecified: Secondary | ICD-10-CM | POA: Insufficient documentation

## 2016-06-17 DIAGNOSIS — E049 Nontoxic goiter, unspecified: Secondary | ICD-10-CM

## 2016-06-17 DIAGNOSIS — E162 Hypoglycemia, unspecified: Secondary | ICD-10-CM | POA: Diagnosis not present

## 2016-06-17 DIAGNOSIS — E063 Autoimmune thyroiditis: Secondary | ICD-10-CM | POA: Diagnosis not present

## 2016-06-17 DIAGNOSIS — H0520 Unspecified exophthalmos: Secondary | ICD-10-CM | POA: Insufficient documentation

## 2016-06-17 LAB — POCT GLYCOSYLATED HEMOGLOBIN (HGB A1C): Hemoglobin A1C: 5.4

## 2016-06-17 LAB — GLUCOSE, POCT (MANUAL RESULT ENTRY): POC Glucose: 103 mg/dl — AB (ref 70–99)

## 2016-06-17 NOTE — Progress Notes (Signed)
Subjective:  Patient Name: Grace Wall Date of Birth: [birth year]  MRN: [id number]  Grace Wall  presents to the office today for follow-up of her diffuse toxic goiter, hypothyroidism, Hashimoto's disease, and exophthalmos in the setting of a new pregnancy.  HISTORY OF PRESENT ILLNESS:   Grace Wall a 30 y.o. [place] young woman. Grace Wall was accompanied by her mother.   1. The patient was first referred to me by herself on 05/08/05.   A. She was diagnosed with thyroid problems at age 60. At that point she was hyperthyroid, had a big anterior neck, and had big eyes, so she presumably had Graves' disease. She was placed on Tapazole (methimazole). Tapazole was subsequently stopped at about age 78.  B. About one year later, she became hypothyroid, presumably due to coexistent Hashimoto's disease. She started Synthroid. The Synthroid was changed to generic levothyroxine in 2005. The patient's eyes had gradually improved over time. At that first visit with me, she was feeling fairly well, but was tired a lot. She was also somewhat mentally confused at times.   C. On physical examination she had intermittent proptosis bilaterally. The thyroid gland was 20+ grams in size. The right lobe was within normal limits, but the left lobe was slightly enlarged. Laboratory data showed a TSH of 3.09, free T4 1.22, and free T3 of 2.6. Her TPO antibody level was 1171.1, with normals being less than 60. A thyroid-stimulating immunoglobulin level was 64, with normals being <129. The thyroid binding inhibitory immunoglobulin was 96, with normals being less than 17. Her TSH receptor antibody was 87, with normals being less than 10. 2. Given the high levels of the thyroid receptor antibody, it was unclear how her thyroid tests would evolve. Repeat thyroid hormone tests performed on 05/18/2005 were markedly different. At this time the TSH was 0.032,  free T4 was 1.77, and free T3 was 3.9. At that point it was still unclear  whether the fluctuations in thyroid hormone were due to changes in the forms of levothyroxine made by different companies, or to  changes in thyroid receptor antibody levels, or both. I changed her to brand Synthroid at a dose of 150 mcg per day.   2. During the past 11 years we have followed her for several problems:  A. We've had to adjust her thyroid hormone doses frequently. Her TSH values have ranged from 0.04 - 8.58. Part of these fluctuations have been due to flare ups of Hashimoto's disease. In addition, because she has not had any health insurance during most of this time, she's often had to rely on thyroid hormone samples. Sometimes she's not always had exactly the right thyroid hormone doses. She also appears to have had fluctuations in her thyroid receptor antibodies which have caused changes in her thyroid hormone levels.  Because of her lack of insurance, however, we had not been able to repeat her thyroid receptor antibody testing.  B. She has also had a problem with chronic iron deficiency anemia.   C. She Wall overweight. In the last 12 months she's had two elevated HbA1c values in the prediabetes zone.  D. She Wall now pregnant, so we are following her every 6-8 weeks with lab tests and clinic visits.   3. The patient's last [facility] visit was on 03/19/16.  A. In the interim, she has been healthy. She Wall taking Synthroid  224 mcg/day (two 112 mcg pills/day) for 6 days each week, but 1.5 pills on Sundays. She takes her Synthroid in  the mornings. She takes her prenatal MVI at night.  B. Her LMP was on 10/10/15 and her EDC Wall 07/16/16.  Her pregnancy Wall going well. She Wall still taking prenatal vitamins at night. She also takes Diclegis (doxylamine succinate) for nausea and vomiting twice daily. When she tried to stop this medication the nausea and vomiting recurred. She has not been taking a stool softener recently.     4. Pertinent Review of Systems:  Constitutional: The patient feels "good, but  very tired". Her morning sickness Wall controlled with Diclegis. Her body temperature Wall somewhat hotter now. She sleeps fairly well, but gets up in the middle of the night with nocturia and general discomfort.    Eyes: Vision Wall pretty good, but she sometimes needs her glasses for reading. There are no other significant eye complaints. She does not think that one eye Wall more prominent than the other. When she moves her eyes around she does not have any sense of pressure or restriction.  Neck: The patient has no complaints of anterior neck swelling, soreness, tenderness,  pressure, discomfort, or difficulty swallowing.  Heart: Heart rate increases with exercise or other physical activity. The patient has no complaints of palpitations, irregular heat beats, chest pain, or chest pressure. Gastrointestinal: As above. She Wall no longer constipated. She has no complaints of reflux, upset stomach, stomach aches, or diarrhea. Legs: Muscle mass and strength seem normal. There are no complaints of numbness, tingling, burning, or pain. She has not had swelling recently.   Feet: There are no obvious foot problems. There are no complaints of numbness, tingling, burning, or pain. No edema Wall noted. GYN: As above    PAST MEDICAL, FAMILY, AND SOCIAL HISTORY:  Past Medical History:  Diagnosis Date  . Exophthalmos   . Graves disease   . Graves' disease   . Hypothyroidism, acquired, autoimmune   . Supervision of low-risk pregnancy 11/28/2015    [facility] Prenatal Labs Dating  LMP Blood type: O/Positive/-- (05/11 1044)  Genetic Screen 1 Screen: negative   AFP:     Quad:     NIPS: Antibody:Negative (05/11 1044) Anatomic US  normal female fetus Rubella: 30.10 (05/11 1044) GTT Third trimester: normal RPR: Non Reactive (10/11 1443)  Flu vaccine  05/11/16 HBsAg: Negative (05/11 1044)  TDaP vaccine   05/11/16                              HIV: Non Reactive (10/11 1443)  Baby Food  breast                          GBS:   Contraception undecided Pap: Negative (08/29/2015) Circumcision N/a female  Pediatrician  [facility]  Support Person husband     . Thyroiditis, autoimmune   . Thyrotoxicosis with diffuse goiter     Family History  Problem Relation Age of Onset  . Cancer Maternal Aunt   . Thyroid disease Son   . Diabetes Mother   . Hypertension Father      Current Outpatient Prescriptions:  .  Doxylamine-Pyridoxine (DICLEGIS) 10-10 MG TBEC, Take 1 tablet with breakfast and lunch.  Take 2 tablets at bedtime., Disp: 100 tablet, Rfl: 4 .  levothyroxine (SYNTHROID, LEVOTHROID) 112 MCG tablet, Take two 112 mcg tablets of brand Synthroid daily. Brand name Wall medically necessary., Disp: 60 tablet, Rfl: 6 .  docusate sodium (COLACE) 100 MG  capsule, Take 1 capsule (100 mg total) by mouth 2 (two) times daily. (Patient not taking: Reported on 06/17/2016), Disp: 90 capsule, Rfl: 1 .  famotidine (PEPCID) 40 MG tablet, Take 1 tablet (40 mg total) by mouth daily. (Patient not taking: Reported on 06/17/2016), Disp: 30 tablet, Rfl: 1 .  Prenatal MV-Min-Fe Fum-FA-DHA (PRENATAL 1 PO), Take by mouth daily. Reported on 02/04/2016, Disp: , Rfl:   Allergies as of 06/17/2016  . (No Known Allergies)    1. 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 She Wall on "Pregnancy Medicaid". Her mother now lives in the [place] Grace Wall will have Medicaid coverage for 6 weeks after delivery.  2. Activities: She stays busy with child care.  3. Smoking, alcohol, or drugs: None 4. Primary Care Provider: None.  5. OB: CNM Samantha Crimes, [facility]  REVIEW OF SYSTEMS: There are no other significant problems involving the patient's other body systems.   Objective:  Vital Signs:  BP 100/60   Pulse (!) 106   Wt 177 lb 6.4 oz (80.5 kg)   LMP 10/10/2015   BMI 29.52 kg/m   Wt Readings from Last 3 Encounters:  06/17/16 177 lb 6.4 oz (80.5 kg)  06/09/16 174 lb 6.4 oz (79.1 kg)  06/02/16 176 lb  12.8 oz (80.2 kg)   PHYSICAL EXAM: Constitutional: The patient appears healthy, but overweight/obese. She has gained twelve pounds since her last visit. She Wall alert, but looks more tired. She has normal affect and insight today.  Eyes:  There Wall no obvious arcus. Her right eye Wall still a tiny bit more prominent than the left, but neither eye appears unusually prominent. She has no inferior proptosis today,  Extraocular movements of her eyes are essentially normal, but upward and lateral movement of the right eye Wall a tiny  bit restricted. She feels no sense of eye muscle pressure when looking upward and left or looking upward and right. Moisture appears normal. Mouth: The oropharynx and tongue appear normal. Oral moisture Wall normal. There Wall no hyperpigmentation.  Neck: The neck appears to be visibly normal. No carotid bruits are noted. The thyroid gland Wall smaller and only minimally enlarged at about 20+ grams in size. The right lobe Wall within normal limits for size. The left lobe Wall only minimally enlarged. The consistency of the thyroid gland Wall normal. The thyroid gland Wall not tender to palpation. Lungs: The lungs are clear to auscultation. Air movement Wall good. Heart: Heart rate and rhythm are regular. Heart sounds S1 and S2 are normal. I did not appreciate any pathologic cardiac murmurs or heart sounds. Abdomen: The abdomen Wall enlarged as expected at this stage of her pregnancy. Bowel sounds are normal. There Wall no obvious hepatomegaly, splenomegaly, or other mass effect.  Arms: Muscle size and bulk are normal for age. Hands: There Wall no obvious tremor. Phalangeal and metacarpophalangeal joints are normal. Palmar muscles are normal for age. Palmar skin Wall normal. Palmar moisture Wall also normal. There Wall no hyperpigmentation.  Legs: Muscles appear normal for age. No edema Wall present. Neurologic: Strength Wall normal for age in both the upper and lower extremities. Muscle tone Wall normal. Sensation to  touch Wall normal in both legs.    LAB DATA:   Labs 06/17/16: HbA1c 5.4%, CBG 103  Labs 06/15/16: TSH 0.26, free T4 1.2, free T3 2.3  Labs 05/07/16: TSH 0.47, free T4 1.1, free T3 2.5  8//28/17: TSH 3.54, free  T4 1.2, free T3 2.2; HbA1c 5.4%  02/04/16: CBG 92 after breakfast, HbA1c 5.4%  02/03/16: TSH 5.85, free T4 1.0, free T3 1.8  12/04/15: TSH 3.52, free T4 1.1, free T3 2.2; iron 51; HbA1c 5.3%  11/28/15: TSH 2.330; CBC normal except for [facility] of 26.7 (normal 27-33)  11/18/15: Positive pregnancy test  04/15/15: HbA1c 5.7%; Hgb 11.7, Hct 35.7, iron 36 (normal 40-190); TSH 1.151, free T4 1.08, free T3 2.8  01/10/15: HbA1c 5.9%; Hgb 11.9, Hct 36.3, iron 43; TSH 2.853, free T4 0.83, free T3 2.5  10/15/14: Hgb 11.3, Hct 35.6, MCH 25.6,Iron 24; TSH 2.549, free T4 0.99, free T3 2.4  04/13/14: Iron 24; Hgb 11.1, Hct 32.6%, MCV 74.9; TSH 0.122, free T4 1.45, free T3 3.3  08/04/12: TSH 0.824, free T4 1.26, free T3 3.4  02/01/13: Iron 84, Hgb 11.8, Hct 34.8%  01/30/13; TSH 1.397, free T4 1.44, free T3 2.8  08/02/12: TSH 3.078, free T4 1.14, free T3 2.7  12/15/11: TSH was 0.520, free T4 1.40, free T3  2.7   Assessment and Plan:   ASSESSMENT: 1. Hypothyroid/hyperthyroid:   A. In September 2016 Grace ComptonKarla was euthyroid in the middle of the normal range. Her TSH was in her TSH goal range of 1.0-2.0. In May 2017 her TSH was mildly elevated at 3.52, so we increased her Synthroid dose to 175 mcg/day. At her visit in July she was more hypothyroid, so I increased her Synthroid dose to 200 mcg/day. When her TSH ws still elevated in August, I increased her Synthroid dose to 224 mcg/day. However when her TSH was low in October I decreased her Synthroid dose again.    B. Since her TSH this week Wall even lower, presumably due to less placental deiodinase activity, we will reduce her Synthroid dose further to achieve the TSH goal range of 1.0-2.0.   2. Thyroiditis: Her Hashimoto's disease Wall clinically quiescent.   3. Goiter: Her thyroid gland Wall even smaller today, just barely enlarged. The process of waxing and waning of thyroid gland size Wall c/w evolving thyroiditis. 4. Exophthalmos: The eyes are essentially within normal limits in terms of prominence again today, but she still has a very mild degree of extraocular movement restriction of her right eye.   5. Overweight: She has gained 12 more pounds as Wall expected during pregnancy. She needs to walk an hour a day or equivalent exercise.  6. Fatigue/coldness/iron deficiency anemia:  A.  Her fatigue and sensation of being cold were due partly to iron deficiency anemia and partly to iron deficiency alone. Her iron level in May was normal. Her CBC in may was also normal except for a slightly low MCH. She needs to continue her prenatal MVIs.  B. She feels more tired now, in part due to not sleeping as well in her 9th month of pregnancy.   C. She feels warmer, most likely due to her higher metabolic rate when pregnant.  7. Hypotension: Her BP Wall good. Her dizziness has resolved.  8. Pre-diabetes: Her HbA1c was in the prediabetic range in June and September 2016, was 5.3% at her last visit, but had increased to 5.4% at her visit in July. While that latter A1c was well within normal limits, I encouraged her to consume fewer carbs and to exercise for an hour per day. Her HbA1c today Wall again within normal limits at 5.4%.  9. Pregnancy, normal, supervision: We will need to decrease her Synthroid dose after delivery and repeat her TFTs about  5 weeks after delivery.  PLAN: 1. Diagnostic: Repeat her thyroid tests 5 weeks after delivery.  2. Therapeutic: Decrease her Synthroid dose to two 112 mcg pills per day on 4 days each week and 1.5 pills/day on 3 days per week. Once she delivers, take 1.5 of the 112 mcg pills daily. Take MVI about dinner time.  3. Patient education: We discussed the fact that we will have to decrease her Synthroid dose now for the next 4 weeks, then  decrease her Synthroid dose even further once she delivers. 4. Follow-up: 6-8 weeks after delivery.   Level of Service: This visit lasted in excess of 45 minutes. More than 50% of the visit was devoted to counseling.  David Stall, MD, CDE Adult and Pediatric Endocrinology

## 2016-06-17 NOTE — Patient Instructions (Signed)
Follow up visit in late February. Until delivery, take 2 of the 112 mcg synthroid pills/day on 4 days each week, but take only 1.5 pills/day on the other 3 days each week. After delivery, reduce the Synthroid dose to 1.5 of the 112 mcg pills every day. Repeat the thyroid blood tests 5 weeks after delivery.

## 2016-06-18 ENCOUNTER — Telehealth: Payer: Self-pay | Admitting: *Deleted

## 2016-06-18 NOTE — Telephone Encounter (Signed)
Patient would like a refill of her Dicleges. Told patient I would let her provider know that she needs that.

## 2016-06-22 ENCOUNTER — Inpatient Hospital Stay (HOSPITAL_COMMUNITY)
Admission: AD | Admit: 2016-06-22 | Discharge: 2016-06-22 | Disposition: A | Discharge status: Home / Self Care | Payer: Medicaid Other | Source: Ambulatory Visit | Attending: Family Medicine | Admitting: Family Medicine

## 2016-06-22 ENCOUNTER — Encounter (HOSPITAL_COMMUNITY): Payer: Self-pay

## 2016-06-22 DIAGNOSIS — R1030 Lower abdominal pain, unspecified: Secondary | ICD-10-CM | POA: Diagnosis present

## 2016-06-22 DIAGNOSIS — K5900 Constipation, unspecified: Secondary | ICD-10-CM | POA: Diagnosis not present

## 2016-06-22 DIAGNOSIS — O26893 Other specified pregnancy related conditions, third trimester: Secondary | ICD-10-CM | POA: Insufficient documentation

## 2016-06-22 DIAGNOSIS — M545 Low back pain: Secondary | ICD-10-CM | POA: Diagnosis not present

## 2016-06-22 DIAGNOSIS — Z3A36 36 weeks gestation of pregnancy: Secondary | ICD-10-CM | POA: Diagnosis not present

## 2016-06-22 DIAGNOSIS — Z3493 Encounter for supervision of normal pregnancy, unspecified, third trimester: Secondary | ICD-10-CM

## 2016-06-22 DIAGNOSIS — K5909 Other constipation: Secondary | ICD-10-CM

## 2016-06-22 DIAGNOSIS — M549 Dorsalgia, unspecified: Secondary | ICD-10-CM | POA: Insufficient documentation

## 2016-06-22 LAB — URINE MICROSCOPIC-ADD ON
BACTERIA UA: NONE SEEN
RBC / HPF: NONE SEEN RBC/hpf (ref 0–5)

## 2016-06-22 LAB — URINALYSIS, ROUTINE W REFLEX MICROSCOPIC
Bilirubin Urine: NEGATIVE
GLUCOSE, UA: NEGATIVE mg/dL
Hgb urine dipstick: NEGATIVE
Ketones, ur: 15 mg/dL — AB
Nitrite: NEGATIVE
PH: 6 (ref 5.0–8.0)
PROTEIN: NEGATIVE mg/dL
SPECIFIC GRAVITY, URINE: 1.02 (ref 1.005–1.030)

## 2016-06-22 NOTE — MAU Note (Signed)
been having a lot of pain in lower and low back. Hadn't had a BM in a few days.  Had one this morning, was really hard and it hurt her, she didn't want to push, because she was afraid she would push the baby out.  Did go, but continues to feel a lot of pressure.  irreg contractions through out the day

## 2016-06-22 NOTE — MAU Provider Note (Signed)
History     CSN: [id number]  Arrival date and time: 06/22/16 1810   None     No chief complaint on file.  HPI Grace Wall is 30 y.o. [phone] 8961w4d weeks presenting with lower abdominal pain and lower back pain that began today.  Reports last BM was a few days ago until today, stool was hard and very painful to have BM.  Was concerned about pushing that she would push baby down.  She continues to feel pressure.  Feels like she completely emptied.  Has had irregular contractions today.  She is a patient of Valentina Lucksachelle Denny, CNM at [facility].    Past Medical History:  Diagnosis Date  . Exophthalmos   . Graves disease   . Graves' disease   . Hypothyroidism, acquired, autoimmune   . Supervision of low-risk pregnancy 11/28/2015    [facility] P[facility]atal Labs Dating  LMP Blood type: O/Positive/-- (05/11 1044)  Genetic Screen 1 Screen: negative   AFP:     Quad:     NIPS: Antibody:Negative (05/11 1044) Anatomic US  normal female fetus Rubella: 30.10 (05/11 1044) GTT Third trimester: normal RPR: Non Reactive (10/11 1443)  Flu vaccine  05/11/16 HBsAg: Negative (05/11 1044)  TDaP vaccine   05/11/16                              HIV: Non Reactive (10/11 1443)  Baby Food  breast                          GBS:  Contraception undecided Pap: Negative (08/29/2015) Circumcision N/a female  Pediatrician  [facility]  Support Person husband     . Thyroiditis, autoimmune   . Thyrotoxicosis with diffuse goiter     Past Surgical History:  Procedure Laterality Date  . NO PAST SURGERIES      Family History  Problem Relation Age of Onset  . Cancer Maternal Aunt   . Thyroid disease Son   . Diabetes Mother   . Hypertension Father     Social History  Substance Use Topics  . Smoking status: Never Smoker  . Smokeless tobacco: Never Used  . Alcohol use No     Comment: ocassionally    Allergies: No Known Allergies  Prescriptions Prior to Admission  Medication Sig Dispense Refill Last Dose  .  docusate sodium (COLACE) 100 MG capsule Take 1 capsule (100 mg total) by mouth 2 (two) times daily. (Patient not taking: Reported on 06/17/2016) 90 capsule 1 Not Taking  . Doxylamine-Pyridoxine (DICLEGIS) 10-10 MG TBEC Take 1 tablet with breakfast and lunch.  Take 2 tablets at bedtime. 100 tablet 4 Taking  . famotidine (PEPCID) 40 MG tablet Take 1 tablet (40 mg total) by mouth daily. (Patient not taking: Reported on 06/17/2016) 30 tablet 1 Not Taking  . levothyroxine (SYNTHROID, LEVOTHROID) 112 MCG tablet Take two 112 mcg tablets of brand Synthroid daily. Brand name is medically necessary. 60 tablet 6 Taking  . Prenatal MV-Min-Fe Fum-FA-DHA (PRENATAL 1 PO) Take by mouth daily. Reported on 02/04/2016   Not Taking    Review of Systems  Constitutional: Negative for chills and fever.  Gastrointestinal: Positive for abdominal pain (lower mid abdominal pain) and constipation. Negative for nausea and vomiting.  Genitourinary: Negative for dysuria, frequency, hematuria and urgency.       Irregular contractions.  Neg for vaginal bleeding, discharge or loss  of fluid.  + for fetal movement.  Musculoskeletal: Positive for back pain.   Physical Exam   Blood pressure 107/61, pulse 93, temperature 98.2 F (36.8 C), temperature source Oral, resp. rate 18, height 5\' 5"  (1.651 m), weight 180 lb 12 oz (82 kg), last menstrual period 10/10/2015, SpO2 99 %.  Physical Exam  Constitutional: She is oriented to person, place, and time. She appears well-developed and well-nourished. No distress.  HENT:  Head: Normocephalic.  Neck: Normal range of motion.  Cardiovascular: Normal rate.   Respiratory: Effort normal.  GI: Soft. She exhibits no distension and no mass. There is no tenderness. There is no rebound, no guarding and no CVA tenderness.  Suprapubic area slightly tender on exam.    Genitourinary: There is no rash, tenderness or lesion on the right labia. There is no rash, tenderness or lesion on the left labia.  Uterus is enlarged. Cervix exhibits no motion tenderness and no discharge. No erythema, tenderness or bleeding in the vagina. No foreign body in the vagina. No vaginal discharge found.  Genitourinary Comments: Cervical exam:  Posterior, closed, thick  Musculoskeletal:  Negative for lower back pain with palpation  Neurological: She is alert and oriented to person, place, and time.  Skin: Skin is warm and dry.  Psychiatric: She has a normal mood and affect. Her behavior is normal. Thought content normal.   Results for orders placed or performed during the hospital encounter of 06/22/16 (from the past 24 hour(s))  Urinalysis, Routine w reflex microscopic (not at [facility])     Status: Abnormal   Collection Time: 06/22/16  6:25 PM  Result Value Ref Range   Color, Urine YELLOW YELLOW   APPearance CLEAR CLEAR   Specific Gravity, Urine 1.020 1.005 - 1.030   pH 6.0 5.0 - 8.0   Glucose, UA NEGATIVE NEGATIVE mg/dL   Hgb urine dipstick NEGATIVE NEGATIVE   Bilirubin Urine NEGATIVE NEGATIVE   Ketones, ur 15 (A) NEGATIVE mg/dL   Protein, ur NEGATIVE NEGATIVE mg/dL   Nitrite NEGATIVE NEGATIVE   Leukocytes, UA SMALL (A) NEGATIVE  Urine microscopic-add on     Status: Abnormal   Collection Time: 06/22/16  6:25 PM  Result Value Ref Range   Squamous Epithelial / LPF 0-5 (A) NONE SEEN   WBC, UA 0-5 0 - 5 WBC/hpf   RBC / HPF NONE SEEN 0 - 5 RBC/hpf   Bacteria, UA NONE SEEN NONE SEEN   MAU Course  Procedures  MDM MSE Labs Exam NST-reactive Cat 1   Assessment and Plan  A:  Lower abdominal pain and back pain at 7327w4d gestation      Constipation with straining for bowel movement       Lower back pain  P:  Encouraged Grace Wall to take a stool softner nightly until stool if easy to pass, then decrease to qohs.  She has medication at home.      Keep scheduled appt for next Tuesday with [name]      Report any worsening sxs; any vaginal bleeding, contractions, Loss of fluid, decreased fetal movement.    KEY,EVE M 06/22/2016, 6:44 PM

## 2016-06-22 NOTE — Discharge Instructions (Signed)
Constipation, Adult Constipation is when a person:  Poops (has a bowel movement) fewer times in a week than normal.  Has a hard time pooping.  Has poop that is dry, hard, or bigger than normal. Follow these instructions at home: Eating and drinking  Eat foods that have a lot of fiber, such as:  Fresh fruits and vegetables.  Whole grains.  Beans.  Eat less of foods that are high in fat, low in fiber, or overly processed, such as:  [place] fries.  Hamburgers.  Cookies.  Candy.  Soda.  Drink enough fluid to keep your pee (urine) clear or pale yellow. General instructions  Exercise regularly or as told by your doctor.  Go to the restroom when you feel like you need to poop. Do not hold it in.  Take over-the-counter and prescription medicines only as told by your doctor. These include any fiber supplements.  Do pelvic floor retraining exercises, such as:  Doing deep breathing while relaxing your lower belly (abdomen).  Relaxing your pelvic floor while pooping.  Watch your condition for any changes.  Keep all follow-up visits as told by your doctor. This is important. Contact a doctor if:  You have pain that gets worse.  You have a fever.  You have not pooped for 4 days.  You throw up (vomit).  You are not hungry.  You lose weight.  You are bleeding from the anus.  You have thin, pencil-like poop (stool). Get help right away if:  You have a fever, and your symptoms suddenly get worse.  You leak poop or have blood in your poop.  Your belly feels hard or bigger than normal (is bloated).  You have very bad belly pain.  You feel dizzy or you faint. This information is not intended to replace advice given to you by your health care provider. Make sure you discuss any questions you have with your health care provider. Document Released: 12/23/2007 Document Revised: 01/24/2016 Document Reviewed: 12/25/2015 Elsevier Interactive Patient Education  2017  Elsevier Inc.   Abdominal Pain, Adult Many things can cause belly (abdominal) pain. Most times, belly pain is not dangerous. Many cases of belly pain can be watched and treated at home. Sometimes belly pain is serious, though. Your doctor will try to find the cause of your belly pain. Follow these instructions at home:  Take over-the-counter and prescription medicines only as told by your doctor. Do not take medicines that help you poop (laxatives) unless told to by your doctor.  Drink enough fluid to keep your pee (urine) clear or pale yellow.  Watch your belly pain for any changes.  Keep all follow-up visits as told by your doctor. This is important. Contact a doctor if:  Your belly pain changes or gets worse.  You are not hungry, or you lose weight without trying.  You are having trouble pooping (constipated) or have watery poop (diarrhea) for more than 2-3 days.  You have pain when you pee or poop.  Your belly pain wakes you up at night.  Your pain gets worse with meals, after eating, or with certain foods.  You are throwing up and cannot keep anything down.  You have a fever. Get help right away if:  Your pain does not go away as soon as your doctor says it should.  You cannot stop throwing up.  Your pain is only in areas of your belly, such as the right side or the left lower part of the belly.  You have bloody or black poop, or poop that looks like tar.  You have very bad pain, cramping, or bloating in your belly.  You have signs of not having enough fluid or water in your body (dehydration), such as:  Dark pee, very little pee, or no pee.  Cracked lips.  Dry mouth.  Sunken eyes.  Sleepiness.  Weakness. This information is not intended to replace advice given to you by your health care provider. Make sure you discuss any questions you have with your health care provider. Document Released: 12/23/2007 Document Revised: 01/24/2016 Document Reviewed:  12/18/2015 Elsevier Interactive Patient Education  2017 [employer]  Third Trimester of Pregnancy The third trimester is from week 29 through week 42, months 7 through 9. This trimester is when your unborn baby (fetus) is growing very fast. At the end of the ninth month, the unborn baby is about 20 inches in length. It weighs about 6-10 pounds. Follow these instructions at home:  Avoid all smoking, herbs, and alcohol. Avoid drugs not approved by your doctor.  Do not use any tobacco products, including cigarettes, chewing tobacco, and electronic cigarettes. If you need help quitting, ask your doctor. You may get counseling or other support to help you quit.  Only take medicine as told by your doctor. Some medicines are safe and some are not during pregnancy.  Exercise only as told by your doctor. Stop exercising if you start having cramps.  Eat regular, healthy meals.  Wear a good support bra if your breasts are tender.  Do not use hot tubs, steam rooms, or saunas.  Wear your seat belt when driving.  Avoid raw meat, uncooked cheese, and liter boxes and soil used by cats.  Take your prenatal vitamins.  Take 1500-2000 milligrams of calcium daily starting at the 20th week of pregnancy until you deliver your baby.  Try taking medicine that helps you poop (stool softener) as needed, and if your doctor approves. Eat more fiber by eating fresh fruit, vegetables, and whole grains. Drink enough fluids to keep your pee (urine) clear or pale yellow.  Take warm water baths (sitz baths) to soothe pain or discomfort caused by hemorrhoids. Use hemorrhoid cream if your doctor approves.  If you have puffy, bulging veins (varicose veins), wear support hose. Raise (elevate) your feet for 15 minutes, 3-4 times a day. Limit salt in your diet.  Avoid heavy lifting, wear low heels, and sit up straight.  Rest with your legs raised if you have leg cramps or low back pain.  Visit your dentist if you  have not gone during your pregnancy. Use a soft toothbrush to brush your teeth. Be gentle when you floss.  You can have sex (intercourse) unless your doctor tells you not to.  Do not travel far distances unless you must. Only do so with your doctor's approval.  Take prenatal classes.  Practice driving to the hospital.  Pack your hospital bag.  Prepare the baby's room.  Go to your doctor visits. Get help if:  You are not sure if you are in labor or if your water has broken.  You are dizzy.  You have mild cramps or pressure in your lower belly (abdominal).  You have a nagging pain in your belly area.  You continue to feel sick to your stomach (nauseous), throw up (vomit), or have watery poop (diarrhea).  You have bad smelling fluid coming from your vagina.  You have pain with peeing (urination). Get help right away  if:  You have a fever.  You are leaking fluid from your vagina.  You are spotting or bleeding from your vagina.  You have severe belly cramping or pain.  You lose or gain weight rapidly.  You have trouble catching your breath and have chest pain.  You notice sudden or extreme puffiness (swelling) of your face, hands, ankles, feet, or legs.  You have not felt the baby move in over an hour.  You have severe headaches that do not go away with medicine.  You have vision changes. This information is not intended to replace advice given to you by your health care provider. Make sure you discuss any questions you have with your health care provider. Document Released: 09/30/2009 Document Revised: 12/12/2015 Document Reviewed: 09/06/2012 Elsevier Interactive Patient Education  2017 [employer].

## 2016-06-30 ENCOUNTER — Ambulatory Visit (INDEPENDENT_AMBULATORY_CARE_PROVIDER_SITE_OTHER): Payer: Medicaid Other | Admitting: Obstetrics and Gynecology

## 2016-06-30 VITALS — BP 104/66 | HR 99 | Wt 178.4 lb

## 2016-06-30 DIAGNOSIS — E063 Autoimmune thyroiditis: Secondary | ICD-10-CM

## 2016-06-30 DIAGNOSIS — O99283 Endocrine, nutritional and metabolic diseases complicating pregnancy, third trimester: Secondary | ICD-10-CM

## 2016-06-30 DIAGNOSIS — E038 Other specified hypothyroidism: Secondary | ICD-10-CM

## 2016-06-30 DIAGNOSIS — Z3493 Encounter for supervision of normal pregnancy, unspecified, third trimester: Secondary | ICD-10-CM

## 2016-06-30 NOTE — Patient Instructions (Signed)

## 2016-06-30 NOTE — Progress Notes (Signed)
Subjective:  Grace Wall is a 30 y.o. G3P2002 at 8825w5d being seen today for ongoing prenatal care.  She is currently monitored for the following issues for this low-risk pregnancy and has Hypothyroidism, acquired, autoimmune; Overweight(278.02); Dyspepsia; Overweight; Anemia, iron deficiency; Supervision of low-risk pregnancy; and Endocrine exophthalmos on her problem list.  Patient reports no complaints.  Contractions: Irregular. Vag. Bleeding: None.  Movement: Present. Denies leaking of fluid.   The following portions of the patient's history were reviewed and updated as appropriate: allergies, current medications, past family history, past medical history, past social history, past surgical history and problem list. Problem list updated.  Objective:   Vitals:   06/30/16 0827  BP: 104/66  Pulse: 99  Weight: 178 lb 6.4 oz (80.9 kg)    Fetal Status: Fetal Heart Rate (bpm): 160   Movement: Present     General:  Alert, oriented and cooperative. Patient is in no acute distress.  Skin: Skin is warm and dry. No rash noted.   Cardiovascular: Normal heart rate noted  Respiratory: Normal respiratory effort, no problems with respiration noted  Abdomen: Soft, gravid, appropriate for gestational age. Pain/Pressure: Present     Pelvic:  Cervical exam performed        Extremities: Normal range of motion.  Edema: None  Mental Status: Normal mood and affect. Normal behavior. Normal judgment and thought content.   Urinalysis:      Assessment and Plan:  Pregnancy: G3P2002 at 2025w5d  1. Encounter for supervision of low-risk pregnancy in third trimester GBS today  2. Hypothyroidism, acquired, autoimmune Followed by endocrine  Term labor symptoms and general obstetric precautions including but not limited to vaginal bleeding, contractions, leaking of fluid and fetal movement were reviewed in detail with the patient. Please refer to After Visit Summary for other counseling recommendations.   Return in about 1 week (around 07/07/2016).   Hermina StaggersMichael L Quintavius Niebuhr, MD

## 2016-06-30 NOTE — Progress Notes (Signed)
Patient is a lot of lower pressure.

## 2016-07-02 LAB — STREP GP B NAA: STREP GROUP B AG: NEGATIVE

## 2016-07-07 ENCOUNTER — Encounter: Payer: Medicaid Other | Admitting: Obstetrics

## 2016-07-08 ENCOUNTER — Encounter: Payer: Self-pay | Admitting: Obstetrics

## 2016-07-08 ENCOUNTER — Ambulatory Visit (INDEPENDENT_AMBULATORY_CARE_PROVIDER_SITE_OTHER): Payer: Medicaid Other | Admitting: Obstetrics

## 2016-07-08 VITALS — BP 102/70 | HR 87 | Wt 179.0 lb

## 2016-07-08 DIAGNOSIS — Z3483 Encounter for supervision of other normal pregnancy, third trimester: Secondary | ICD-10-CM | POA: Diagnosis not present

## 2016-07-08 DIAGNOSIS — Z3480 Encounter for supervision of other normal pregnancy, unspecified trimester: Secondary | ICD-10-CM

## 2016-07-08 NOTE — Progress Notes (Signed)
Subjective:    Grace Wall is a 30 y.o. female being seen today for her obstetrical visit. She is at 6455w6d gestation. Patient reports no complaints. Fetal movement: normal.  Problem List Items Addressed This Visit    None     Patient Active Problem List   Diagnosis Date Noted  . Endocrine exophthalmos 06/17/2016  . Supervision of low-risk pregnancy 11/28/2015  . Anemia, iron deficiency 04/17/2014  . Dyspepsia 02/02/2013  . Overweight 02/02/2013  . Overweight(278.02) 08/03/2012  . Hypothyroidism, acquired, autoimmune     Objective:    BP 102/70   Pulse 87   Wt 179 lb (81.2 kg)   LMP 10/10/2015   BMI 29.79 kg/m  FHT: 150 BPM  Uterine Size: size equals dates  Presentations: cephalic    Assessment:    Pregnancy @ 5955w6d weeks   Plan:   Plans for delivery: Vaginal anticipated; labs reviewed; problem list updated Counseling: Consent signed. Infant feeding: plans to breastfeed. Cigarette smoking: never smoked. L&D discussion: symptoms of labor, discussed when to call, discussed what number to call, anesthetic/analgesic options reviewed and delivering clinician:  plans no preference. Postpartum supports and preparation: circumcision discussed and contraception plans discussed.  Follow up in 1 Week.  Patient ID: Grace Wall, female   DOB: 28-Dec-1985, 30 y.o.   MRN: [id number]

## 2016-07-15 ENCOUNTER — Encounter (HOSPITAL_COMMUNITY): Payer: Self-pay | Admitting: *Deleted

## 2016-07-15 ENCOUNTER — Telehealth (HOSPITAL_COMMUNITY): Payer: Self-pay | Admitting: *Deleted

## 2016-07-15 ENCOUNTER — Ambulatory Visit (INDEPENDENT_AMBULATORY_CARE_PROVIDER_SITE_OTHER): Payer: Medicaid Other | Admitting: Obstetrics and Gynecology

## 2016-07-15 VITALS — BP 108/59 | HR 83 | Wt 180.0 lb

## 2016-07-15 DIAGNOSIS — N898 Other specified noninflammatory disorders of vagina: Secondary | ICD-10-CM

## 2016-07-15 DIAGNOSIS — O9989 Other specified diseases and conditions complicating pregnancy, childbirth and the puerperium: Secondary | ICD-10-CM

## 2016-07-15 DIAGNOSIS — E038 Other specified hypothyroidism: Secondary | ICD-10-CM

## 2016-07-15 DIAGNOSIS — Z3490 Encounter for supervision of normal pregnancy, unspecified, unspecified trimester: Secondary | ICD-10-CM

## 2016-07-15 DIAGNOSIS — O99283 Endocrine, nutritional and metabolic diseases complicating pregnancy, third trimester: Secondary | ICD-10-CM

## 2016-07-15 DIAGNOSIS — E063 Autoimmune thyroiditis: Secondary | ICD-10-CM

## 2016-07-15 NOTE — Telephone Encounter (Signed)
Preadmission screen  

## 2016-07-15 NOTE — Progress Notes (Signed)
Pt c/o vaginal pain and reqs cx check today. Pt also c/o possible yeast infection.

## 2016-07-15 NOTE — Progress Notes (Signed)
Subjective:  Grace Wall is a 30 y.o. G3P2002 at 722w6d being seen today for ongoing prenatal care.  She is currently monitored for the following issues for this low-risk pregnancy and has Hypothyroidism, acquired, autoimmune; Overweight(278.02); Dyspepsia; Overweight; Anemia, iron deficiency; Supervision of low-risk pregnancy; and Endocrine exophthalmos on her problem list.  Patient reports vaginal irritation.  Contractions: Irregular. Vag. Bleeding: None.  Movement: Present. Denies leaking of fluid.   The following portions of the patient's history were reviewed and updated as appropriate: allergies, current medications, past family history, past medical history, past social history, past surgical history and problem list. Problem list updated.  Objective:   Vitals:   07/15/16 0814  BP: (!) 108/59  Pulse: 83  Weight: 180 lb (81.6 kg)    Fetal Status: Fetal Heart Rate (bpm): 140   Movement: Present     General:  Alert, oriented and cooperative. Patient is in no acute distress.  Skin: Skin is warm and dry. No rash noted.   Cardiovascular: Normal heart rate noted  Respiratory: Normal respiratory effort, no problems with respiration noted  Abdomen: Soft, gravid, appropriate for gestational age. Pain/Pressure: Present     Pelvic:  Cervical exam performed        Extremities: Normal range of motion.  Edema: None  Mental Status: Normal mood and affect. Normal behavior. Normal judgment and thought content.   Urinalysis:      Assessment and Plan:  Pregnancy: G3P2002 at 5322w6d  1. Encounter for supervision of low-risk pregnancy, antepartum IOL at 41 weeks  2. Hypothyroidism, acquired, autoimmune   3. Vaginal irritation  - WET PREP FOR TRICH, YEAST, CLUE  Term labor symptoms and general obstetric precautions including but not limited to vaginal bleeding, contractions, leaking of fluid and fetal movement were reviewed in detail with the patient. Please refer to After Visit Summary  for other counseling recommendations.  Return in about 1 week (around 07/22/2016) for OB visit.   Hermina StaggersMichael L Chessie Neuharth, MD

## 2016-07-15 NOTE — Patient Instructions (Signed)

## 2016-07-16 ENCOUNTER — Inpatient Hospital Stay (HOSPITAL_COMMUNITY)
Admission: AD | Admit: 2016-07-16 | Discharge: 2016-07-17 | DRG: 775 | Disposition: A | Discharge status: Home / Self Care | Payer: Medicaid Other | Source: Ambulatory Visit | Attending: Obstetrics and Gynecology | Admitting: Obstetrics and Gynecology

## 2016-07-16 ENCOUNTER — Inpatient Hospital Stay (HOSPITAL_COMMUNITY): Payer: Medicaid Other | Admitting: Anesthesiology

## 2016-07-16 ENCOUNTER — Encounter (HOSPITAL_COMMUNITY): Payer: Self-pay | Admitting: *Deleted

## 2016-07-16 DIAGNOSIS — Z6830 Body mass index (BMI) 30.0-30.9, adult: Secondary | ICD-10-CM

## 2016-07-16 DIAGNOSIS — Z833 Family history of diabetes mellitus: Secondary | ICD-10-CM

## 2016-07-16 DIAGNOSIS — E063 Autoimmune thyroiditis: Secondary | ICD-10-CM | POA: Diagnosis present

## 2016-07-16 DIAGNOSIS — D649 Anemia, unspecified: Secondary | ICD-10-CM | POA: Diagnosis not present

## 2016-07-16 DIAGNOSIS — O99214 Obesity complicating childbirth: Secondary | ICD-10-CM | POA: Diagnosis present

## 2016-07-16 DIAGNOSIS — O99284 Endocrine, nutritional and metabolic diseases complicating childbirth: Secondary | ICD-10-CM | POA: Diagnosis present

## 2016-07-16 DIAGNOSIS — O4202 Full-term premature rupture of membranes, onset of labor within 24 hours of rupture: Secondary | ICD-10-CM | POA: Diagnosis present

## 2016-07-16 DIAGNOSIS — Z8249 Family history of ischemic heart disease and other diseases of the circulatory system: Secondary | ICD-10-CM | POA: Diagnosis not present

## 2016-07-16 DIAGNOSIS — Z3A40 40 weeks gestation of pregnancy: Secondary | ICD-10-CM | POA: Diagnosis not present

## 2016-07-16 DIAGNOSIS — O9081 Anemia of the puerperium: Secondary | ICD-10-CM | POA: Diagnosis not present

## 2016-07-16 DIAGNOSIS — E669 Obesity, unspecified: Secondary | ICD-10-CM | POA: Diagnosis present

## 2016-07-16 LAB — CBC
HCT: 32 % — ABNORMAL LOW (ref 36.0–46.0)
HEMOGLOBIN: 10.9 g/dL — AB (ref 12.0–15.0)
MCH: 28.7 pg (ref 26.0–34.0)
MCHC: 34.1 g/dL (ref 30.0–36.0)
MCV: 84.2 fL (ref 78.0–100.0)
PLATELETS: 133 10*3/uL — AB (ref 150–400)
RBC: 3.8 MIL/uL — AB (ref 3.87–5.11)
RDW: 13.9 % (ref 11.5–15.5)
WBC: 7.8 10*3/uL (ref 4.0–10.5)

## 2016-07-16 LAB — TYPE AND SCREEN
ABO/RH(D): O POS
Antibody Screen: NEGATIVE

## 2016-07-16 LAB — RPR: RPR: NONREACTIVE

## 2016-07-16 MED ORDER — ACETAMINOPHEN 325 MG PO TABS: 650.0000 mg | ORAL_TABLET | ORAL | Status: DC | PRN

## 2016-07-16 MED ORDER — SODIUM CHLORIDE 0.9 % IV SOLN: 250.0000 mL | INTRAVENOUS | Status: DC | PRN

## 2016-07-16 MED ORDER — SIMETHICONE 80 MG PO CHEW: 80.0000 mg | CHEWABLE_TABLET | ORAL | Status: DC | PRN

## 2016-07-16 MED ORDER — DIPHENHYDRAMINE HCL 25 MG PO CAPS: 25.0000 mg | ORAL_CAPSULE | Freq: Four times a day (QID) | ORAL | Status: DC | PRN

## 2016-07-16 MED ORDER — LIDOCAINE HCL (PF) 1 % IJ SOLN
30.0000 mL | INTRAMUSCULAR | Status: DC | PRN
  Administered 2016-07-16: 30 mL via SUBCUTANEOUS
  Filled 2016-07-16: qty 30

## 2016-07-16 MED ORDER — LIDOCAINE HCL (PF) 1 % IJ SOLN
INTRAMUSCULAR | Status: DC | PRN
  Administered 2016-07-16 (×2): 4 mL via EPIDURAL

## 2016-07-16 MED ORDER — EPHEDRINE 5 MG/ML INJ
10.0000 mg | INTRAVENOUS | Status: DC | PRN
Start: 2016-07-16 — End: 2016-07-16
  Filled 2016-07-16: qty 4

## 2016-07-16 MED ORDER — EPHEDRINE 5 MG/ML INJ
10.0000 mg | INTRAVENOUS | Status: DC | PRN
  Filled 2016-07-16: qty 4

## 2016-07-16 MED ORDER — DIBUCAINE 1 % RE OINT: 1.0000 "application " | TOPICAL_OINTMENT | RECTAL | Status: DC | PRN

## 2016-07-16 MED ORDER — OXYCODONE-ACETAMINOPHEN 5-325 MG PO TABS: 2.0000 | ORAL_TABLET | ORAL | Status: DC | PRN

## 2016-07-16 MED ORDER — ONDANSETRON HCL 4 MG PO TABS: 4.0000 mg | ORAL_TABLET | ORAL | Status: DC | PRN

## 2016-07-16 MED ORDER — WITCH HAZEL-GLYCERIN EX PADS
1.0000 "application " | MEDICATED_PAD | CUTANEOUS | Status: DC | PRN
  Administered 2016-07-16: 1 via TOPICAL

## 2016-07-16 MED ORDER — OXYTOCIN 40 UNITS IN LACTATED RINGERS INFUSION - SIMPLE MED: 2.5000 [IU]/h | INTRAVENOUS | Status: DC | PRN

## 2016-07-16 MED ORDER — ONDANSETRON HCL 4 MG/2ML IJ SOLN: 4.0000 mg | INTRAMUSCULAR | Status: DC | PRN

## 2016-07-16 MED ORDER — TETANUS-DIPHTH-ACELL PERTUSSIS 5-2.5-18.5 LF-MCG/0.5 IM SUSP: 0.5000 mL | Freq: Once | INTRAMUSCULAR | Status: DC

## 2016-07-16 MED ORDER — SOD CITRATE-CITRIC ACID 500-334 MG/5ML PO SOLN: 30.0000 mL | ORAL | Status: DC | PRN

## 2016-07-16 MED ORDER — SODIUM CHLORIDE 0.9% FLUSH: 3.0000 mL | Freq: Two times a day (BID) | INTRAVENOUS | Status: DC

## 2016-07-16 MED ORDER — FLEET ENEMA 7-19 GM/118ML RE ENEM: 1.0000 | ENEMA | RECTAL | Status: DC | PRN

## 2016-07-16 MED ORDER — LACTATED RINGERS IV SOLN: 500.0000 mL | INTRAVENOUS | Status: DC | PRN

## 2016-07-16 MED ORDER — ZOLPIDEM TARTRATE 5 MG PO TABS: 5.0000 mg | ORAL_TABLET | Freq: Every evening | ORAL | Status: DC | PRN

## 2016-07-16 MED ORDER — OXYTOCIN BOLUS FROM INFUSION: 500.0000 mL | Freq: Once | INTRAVENOUS | Status: DC

## 2016-07-16 MED ORDER — IBUPROFEN 600 MG PO TABS
600.0000 mg | ORAL_TABLET | Freq: Four times a day (QID) | ORAL | Status: DC
  Administered 2016-07-16 – 2016-07-17 (×5): 600 mg via ORAL
  Filled 2016-07-16 (×5): qty 1

## 2016-07-16 MED ORDER — PHENYLEPHRINE 40 MCG/ML (10ML) SYRINGE FOR IV PUSH (FOR BLOOD PRESSURE SUPPORT)
PREFILLED_SYRINGE | INTRAVENOUS | Status: AC
  Filled 2016-07-16: qty 20

## 2016-07-16 MED ORDER — BENZOCAINE-MENTHOL 20-0.5 % EX AERO
1.0000 "application " | INHALATION_SPRAY | CUTANEOUS | Status: DC | PRN
  Administered 2016-07-16: 1 via TOPICAL
  Filled 2016-07-16: qty 56

## 2016-07-16 MED ORDER — OXYCODONE-ACETAMINOPHEN 5-325 MG PO TABS: 1.0000 | ORAL_TABLET | ORAL | Status: DC | PRN

## 2016-07-16 MED ORDER — PHENYLEPHRINE 40 MCG/ML (10ML) SYRINGE FOR IV PUSH (FOR BLOOD PRESSURE SUPPORT)
80.0000 ug | PREFILLED_SYRINGE | INTRAVENOUS | Status: DC | PRN
Start: 2016-07-16 — End: 2016-07-16
  Filled 2016-07-16: qty 5

## 2016-07-16 MED ORDER — OXYTOCIN 40 UNITS IN LACTATED RINGERS INFUSION - SIMPLE MED
2.5000 [IU]/h | INTRAVENOUS | Status: DC
  Filled 2016-07-16: qty 1000

## 2016-07-16 MED ORDER — DIPHENHYDRAMINE HCL 50 MG/ML IJ SOLN: 12.5000 mg | INTRAMUSCULAR | Status: DC | PRN

## 2016-07-16 MED ORDER — FENTANYL 2.5 MCG/ML BUPIVACAINE 1/10 % EPIDURAL INFUSION (WH - ANES)
14.0000 mL/h | INTRAMUSCULAR | Status: DC | PRN
  Administered 2016-07-16: 14 mL/h via EPIDURAL

## 2016-07-16 MED ORDER — SODIUM CHLORIDE 0.9% FLUSH: 3.0000 mL | INTRAVENOUS | Status: DC | PRN

## 2016-07-16 MED ORDER — LACTATED RINGERS IV SOLN: 500.0000 mL | Freq: Once | INTRAVENOUS | Status: DC

## 2016-07-16 MED ORDER — LACTATED RINGERS IV SOLN: INTRAVENOUS | Status: DC

## 2016-07-16 MED ORDER — FENTANYL 2.5 MCG/ML BUPIVACAINE 1/10 % EPIDURAL INFUSION (WH - ANES)
INTRAMUSCULAR | Status: AC
  Filled 2016-07-16: qty 100

## 2016-07-16 MED ORDER — PRENATAL MULTIVITAMIN CH
1.0000 | ORAL_TABLET | Freq: Every day | ORAL | Status: DC
  Administered 2016-07-16 – 2016-07-17 (×2): 1 via ORAL
  Filled 2016-07-16 (×2): qty 1

## 2016-07-16 MED ORDER — ONDANSETRON HCL 4 MG/2ML IJ SOLN
4.0000 mg | Freq: Four times a day (QID) | INTRAMUSCULAR | Status: DC | PRN
  Administered 2016-07-16: 4 mg via INTRAVENOUS
  Filled 2016-07-16: qty 2

## 2016-07-16 MED ORDER — SENNOSIDES-DOCUSATE SODIUM 8.6-50 MG PO TABS
2.0000 | ORAL_TABLET | ORAL | Status: DC
  Administered 2016-07-16: 2 via ORAL
  Filled 2016-07-16: qty 2

## 2016-07-16 MED ORDER — COCONUT OIL OIL: 1.0000 "application " | TOPICAL_OIL | Status: DC | PRN

## 2016-07-16 NOTE — Progress Notes (Signed)
Patient ID: Grace CocoKarla S Wall, female   DOB: July 06, 1986, 30 y.o.   MRN: [id number] RN asked me to review FHR tracing due to decreased variability  I noted it to have several accels that meet criteria and average to fair variability Earlier there were 2-3 small variable decels  UCs frequent  Pt now has epidural  Will continue to observe

## 2016-07-16 NOTE — H&P (Signed)
LABOR AND DELIVERY ADMISSION HISTORY AND PHYSICAL NOTE  Grace CocoKarla S Schonberger is a 30 y.o. female G3P2002 with IUP at 4019w0d presenting for SROM around 0100.   She reports positive fetal movement. She denies vaginal bleeding.  Prenatal History/Complications: [facility] [place] [facility]  Dating  LMP Blood type: O/Positive/-- (05/11 1044)   Genetic Screen 1 Screen: negative   AFP:     Quad:     NIPS: Antibody:Negative (05/11 1044)  Anatomic US  normal female fetus Rubella: 30.10 (05/11 1044)  GTT Third trimester: normal RPR: Non Reactive (10/11 1443)   Flu vaccine  05/11/16 HBsAg: Negative (05/11 1044)   TDaP vaccine   05/11/16                              HIV: Non Reactive (10/11 1443)   Baby Food  breast                          GBS: Neg  Contraception undecided Pap: Negative (08/29/2015)  Circumcision N/a female   Pediatrician  [facility]   Support Person husband    Past Medical History: Past Medical History:  Diagnosis Date  . Exophthalmos   . Hypothyroidism, acquired, autoimmune   . Supervision of low-risk pregnancy 11/28/2015    [facility] [place] Dating  LMP Blood type: O/Positive/-- (05/11 1044)  Genetic Screen 1 Screen: negative   AFP:     Quad:     NIPS: Antibody:Negative (05/11 1044) Anatomic US  normal female fetus Rubella: 30.10 (05/11 1044) GTT Third trimester: normal RPR: Non Reactive (10/11 1443)  Flu vaccine  05/11/16 HBsAg: Negative (05/11 1044)  TDaP vaccine   05/11/16                              HIV: Non Reactive (10/11 1443)  Baby Food  breast                          GBS:  Contraception undecided Pap: Negative (08/29/2015) Circumcision N/a female  Pediatrician  [facility]  Support Person husband     . Thyroiditis, autoimmune     Past Surgical History: Past Surgical History:  Procedure Laterality Date  . NO PAST SURGERIES      Obstetrical History: OB History    Gravida Para Term Preterm AB Living   3 2 2     2    SAB TAB Ectopic Multiple Live  Births           2      Social History: Social History   Social History  . Marital status: Married    Spouse name: N/A  . Number of children: N/A  . Years of education: N/A   Social History Main Topics  . Smoking status: Never Smoker  . Smokeless tobacco: Never Used  . Alcohol use No     Comment: ocassionally  . Drug use: No  . Sexual activity: Yes   Other Topics Concern  . None   Social History Narrative  . None    Family History: Family History  Problem Relation Age of Onset  . Cancer Maternal Aunt   . Thyroid disease Son   . Diabetes Mother   . Hypertension Father     Allergies: No Known Allergies  Prescriptions Prior  to Admission  Medication Sig Dispense Refill Last Dose  . calcium carbonate (TUMS - DOSED IN MG ELEMENTAL CALCIUM) 500 MG chewable tablet Chew 2 tablets by mouth daily as needed for indigestion or heartburn.   07/15/2016 at Unknown time  . docusate sodium (COLACE) 100 MG capsule Take 1 capsule (100 mg total) by mouth 2 (two) times daily. 90 capsule 1 Past Week at Unknown time  . levothyroxine (SYNTHROID, LEVOTHROID) 112 MCG tablet Take two 112 mcg tablets of brand Synthroid daily. Brand name is medically necessary. (Patient taking differently: Take 168-224 mcg by mouth daily before breakfast. Take two 112 mcg tablets of brand Synthroid daily on Monday- Thursday. Take one and one half 112 mcg tablets Friday-Sunday. Brand name is medically necessary.) 60 tablet 6 07/15/2016 at Unknown time  . Prenatal Vit-Fe Fumarate-FA (PRENATAL MULTIVITAMIN) TABS tablet Take 1 tablet by mouth daily at 12 noon.   Unknown at Unknown time     Review of Systems   All systems reviewed and negative except as stated in HPI  Blood pressure 121/65, pulse 76, temperature 98.2 F (36.8 C), temperature source Oral, resp. rate 16, height 5\' 5"  (1.651 m), weight 180 lb (81.6 kg), last menstrual period 10/10/2015, SpO2 99 %. General appearance: alert, cooperative and no  distress Lungs: clear to auscultation bilaterally Heart: regular rate and rhythm Abdomen: soft, non-tender; bowel sounds normal Extremities: No calf swelling or tenderness Presentation: cephalic Fetal monitoring: 145. Mod var/ No decels Uterine activity: q3-6 Dilation: 5 Effacement (%): 80 Station: -2 Exam by:: Kayren EavesAshley Garvey RN    Prenatal labs: ABO, Rh: O/Positive/-- (05/11 1044) Antibody: Negative (05/11 1044) Rubella: !Error! RPR: Non Reactive (10/11 1443)  HBsAg: Negative (05/11 1044)  HIV: Non Reactive (10/11 1443)  GBS: Negative (12/12 0916)  1 hr Glucola: 112 Genetic screening:  nml Anatomy US: nml  Prenatal Transfer Tool  Maternal Diabetes: No Genetic Screening: Normal Maternal Ultrasounds/Referrals: Normal Fetal Ultrasounds or other Referrals:  None Maternal Substance Abuse:  No Significant Maternal Medications:  Meds include: Syntroid Significant Maternal Lab Results: Lab values include: Group B Strep negative  Results for orders placed or performed during the hospital encounter of 07/16/16 (from the past 24 hour(s))  CBC   Collection Time: 07/16/16  2:30 AM  Result Value Ref Range   WBC 7.8 4.0 - 10.5 K/uL   RBC 3.80 (L) 3.87 - 5.11 MIL/uL   Hemoglobin 10.9 (L) 12.0 - 15.0 g/dL   HCT 16.132.0 (L) 09.636.0 - 04.546.0 %   MCV 84.2 78.0 - 100.0 fL   MCH 28.7 26.0 - 34.0 pg   MCHC 34.1 30.0 - 36.0 g/dL   RDW 40.913.9 81.111.5 - 91.415.5 %   Platelets 133 (L) 150 - 400 K/uL    Patient Active Problem List   Diagnosis Date Noted  . Endocrine exophthalmos 06/17/2016  . Supervision of low-risk pregnancy 11/28/2015  . Anemia, iron deficiency 04/17/2014  . Dyspepsia 02/02/2013  . Overweight 02/02/2013  . Overweight(278.02) 08/03/2012  . Hypothyroidism, acquired, autoimmune     Assessment: Grace Wall is a 30 y.o. G3P2002 at 5414w0d here for SROM  #Labor: progressing well, does not desire augmentation #Pain: Planning epidrual #FWB: Cat I #ID:  GBS neg #MOF:  breast #MOC:condoms #Circ:  n/a  Clearance Cootsndrew Tyson 07/16/2016, 3:03 AM  The patient was seen and examined by me also Agree with note NST reactive and reassuring UCs as listed Cervical exams as listed in note  Aviva SignsMarie L Aariya Ferrick, CNM

## 2016-07-16 NOTE — Anesthesia Preprocedure Evaluation (Signed)
Anesthesia Evaluation  Patient identified by MRN, date of birth, ID band Patient awake    Reviewed: Allergy & Precautions, Patient's Chart, lab work & pertinent test results  Airway Mallampati: II       Dental no notable dental hx. (+) Teeth Intact   Pulmonary neg pulmonary ROS,    Pulmonary exam normal breath sounds clear to auscultation       Cardiovascular Normal cardiovascular exam Rhythm:Regular Rate:Normal     Neuro/Psych negative neurological ROS  negative psych ROS   GI/Hepatic   Endo/Other  Hypothyroidism Hx/o autoimmune thyroiditits Obesity  Renal/GU   negative genitourinary   Musculoskeletal negative musculoskeletal ROS (+)   Abdominal (+) + obese,   Peds  Hematology  (+) anemia , Thrombocytopenia-mild   Anesthesia Other Findings   Reproductive/Obstetrics (+) Pregnancy                             Anesthesia Physical Anesthesia Plan  ASA: II  Anesthesia Plan: Epidural   Post-op Pain Management:    Induction:   Airway Management Planned: Natural Airway  Additional Equipment:   Intra-op Plan:   Post-operative Plan:   Informed Consent: I have reviewed the patients History and Physical, chart, labs and discussed the procedure including the risks, benefits and alternatives for the proposed anesthesia with the patient or authorized representative who has indicated his/her understanding and acceptance.     Plan Discussed with: Anesthesiologist  Anesthesia Plan Comments:         Anesthesia Quick Evaluation

## 2016-07-16 NOTE — Progress Notes (Signed)
Deliveyr of live viable female by Dr Omer JackMumaw. APGARS 6,9

## 2016-07-16 NOTE — Lactation Note (Signed)
This note was copied from a baby's chart. Lactation Consultation Note  Patient Name: Grace Wall ZOXWR'UToday's Date: 07/16/2016 Reason for consult: Initial assessment   Initial consult with first time mom of 2 hour old infant. Infant was STS with mom and latched to left breast in the semi side lying position with good support. Infant was holding nipple in mouth and not suckling. Mom reports she just received her shots. Mom then placed infant STS and infant calm and settled on mom's chest.   Mom reports she BF her 2 older children for 2 years and 1.5 years without any problems or concerns. Enc mom to feed infant STS 8-12 x in 24 hours at first feeding cues. Feeding log given with instructions for use.   BF Resources handout and LC Brochure given, mom informed of IP/OP Services, BF Support Groups and LC phone #. Enc mom to call out to desk for feeding assistance as needed. [facility] and requested a manual pump for home, pump given to mom.    Maternal Data Formula Feeding for Exclusion: No Does the patient have breastfeeding experience prior to this delivery?: Yes  Feeding Feeding Type: Breast Fed  LATCH Score/Interventions                      Lactation Tools Discussed/Used WIC Program: Yes   Consult Status Consult Status: Follow-up Date: 07/17/16 Follow-up type: In-patient    [name] 07/16/2016, 10:50 AM

## 2016-07-16 NOTE — MAU Note (Signed)
Pt reports ROM at 0130, denies contractions.

## 2016-07-16 NOTE — Addendum Note (Signed)
Addended by: Dalphine HandingGARDNER, Wisdom Rickey L on: 07/16/2016 04:05 PM   Modules accepted: Orders

## 2016-07-16 NOTE — Anesthesia Procedure Notes (Signed)
Epidural Patient location during procedure: OB Start time: 07/16/2016 4:14 AM  Staffing Anesthesiologist: Mal AmabileFOSTER, Takako Minckler Performed: anesthesiologist   Preanesthetic Checklist Completed: patient identified, site marked, surgical consent, pre-op evaluation, timeout performed, IV checked, risks and benefits discussed and monitors and equipment checked  Epidural Patient position: sitting Prep: site prepped and draped and DuraPrep Patient monitoring: continuous pulse ox and blood pressure Approach: midline Injection technique: LOR air  Needle:  Needle type: Tuohy  Needle gauge: 17 G Needle length: 9 cm and 9 Needle insertion depth: 6 cm Catheter type: closed end flexible Catheter size: 19 Gauge Catheter at skin depth: 11 cm Test dose: negative and Other  Assessment Events: blood not aspirated, injection not painful, no injection resistance, negative IV test and no paresthesia  Additional Notes Patient identified. Risks and benefits discussed including failed block, incomplete  Pain control, post dural puncture headache, nerve damage, paralysis, blood pressure Changes, nausea, vomiting, reactions to medications-both toxic and allergic and post Partum back pain. All questions were answered. Patient expressed understanding and wished to proceed. Sterile technique was used throughout procedure. Epidural site was Dressed with sterile barrier dressing. No paresthesias, signs of intravascular injection Or signs of intrathecal spread were encountered.  Patient was more comfortable after the epidural was dosed. Please see RN's note for documentation of vital signs and FHR which are stable.

## 2016-07-16 NOTE — Anesthesia Postprocedure Evaluation (Signed)
Anesthesia Post Note  Patient: Grace Wall  Procedure(s) Performed: * No procedures listed *  Patient location during evaluation: Mother Baby Anesthesia Type: Epidural Level of consciousness: awake and alert Pain management: pain level controlled Vital Signs Assessment: post-procedure vital signs reviewed and stable Respiratory status: spontaneous breathing and nonlabored ventilation Cardiovascular status: stable Postop Assessment: no headache, patient able to bend at knees, no backache, no signs of nausea or vomiting, epidural receding and adequate PO intake Anesthetic complications: no        Last Vitals:  Vitals:   07/16/16 1040 07/16/16 1140  BP: (!) 115/58 (!) 105/51  Pulse: 68 72  Resp: 20 18  Temp: 36.7 C 36.7 C    Last Pain:  Vitals:   07/16/16 1140  TempSrc: Oral  PainSc: 0-No pain   Pain Goal: Patients Stated Pain Goal: 5 (07/16/16 0256)               Laban EmperorMalinova,Donella Pascarella Hristova

## 2016-07-17 LAB — WET PREP FOR TRICH, YEAST, CLUE

## 2016-07-17 LAB — CBC
HEMATOCRIT: 26.7 % — AB (ref 36.0–46.0)
Hemoglobin: 8.9 g/dL — ABNORMAL LOW (ref 12.0–15.0)
MCH: 28.3 pg (ref 26.0–34.0)
MCHC: 33.3 g/dL (ref 30.0–36.0)
MCV: 85 fL (ref 78.0–100.0)
PLATELETS: 107 10*3/uL — AB (ref 150–400)
RBC: 3.14 MIL/uL — AB (ref 3.87–5.11)
RDW: 14.2 % (ref 11.5–15.5)
WBC: 8 10*3/uL (ref 4.0–10.5)

## 2016-07-17 LAB — PLEASE NOTE

## 2016-07-17 MED ORDER — SENNOSIDES-DOCUSATE SODIUM 8.6-50 MG PO TABS: 2.0000 | ORAL_TABLET | Freq: Two times a day (BID) | ORAL | 2 refills | Status: DC

## 2016-07-17 MED ORDER — OXYCODONE-ACETAMINOPHEN 5-325 MG PO TABS: 1.0000 | ORAL_TABLET | ORAL | 0 refills | Status: DC | PRN

## 2016-07-17 MED ORDER — FERROUS SULFATE 325 (65 FE) MG PO TABS: 325.0000 mg | ORAL_TABLET | Freq: Three times a day (TID) | ORAL | 3 refills | Status: DC

## 2016-07-17 MED ORDER — FERROUS SULFATE 325 (65 FE) MG PO TABS
325.0000 mg | ORAL_TABLET | Freq: Three times a day (TID) | ORAL | Status: DC
  Administered 2016-07-17: 325 mg via ORAL
  Filled 2016-07-17: qty 1

## 2016-07-17 MED ORDER — IBUPROFEN 600 MG PO TABS: 600.0000 mg | ORAL_TABLET | Freq: Four times a day (QID) | ORAL | 2 refills | Status: DC

## 2016-07-17 NOTE — Progress Notes (Signed)
Post Partum Day #1 Subjective: no complaints, up ad lib, voiding, tolerating PO and desires to be discharged home, states that breastfeeding is going well  Objective: Blood pressure (!) 98/45, pulse 64, temperature 98.2 F (36.8 C), resp. rate 18, height 5\' 5"  (1.651 m), weight 180 lb (81.6 kg), last menstrual period 10/10/2015, SpO2 98 %, unknown if currently breastfeeding.  Physical Exam:  General: alert, cooperative and no distress Lochia: appropriate Uterine Fundus: firm Incision: no significant drainage, no dehiscence, no significant erythema DVT Evaluation: No evidence of DVT seen on physical exam. No cords or calf tenderness. No significant calf/ankle edema.   Recent Labs  07/16/16 0230 07/17/16 0532  HGB 10.9* 8.9*  HCT 32.0* 26.7*    Assessment/Plan: Discharge home, Breastfeeding and Contraception condoms   LOS: 1 day   Grace Wall, CNM 07/17/2016, 8:43 AM

## 2016-07-17 NOTE — Discharge Instructions (Signed)

## 2016-07-17 NOTE — Lactation Note (Signed)
This note was copied from a baby's chart. Lactation Consultation Note  Patient Name: Grace Ronald LoboKarla Lukas EAVWU'JToday's Date: 07/17/2016 Reason for consult: Follow-up assessment (5% weight loss, Bili check WNL )  Baby is 26 hours old. Baby just finished breast feeding . Per mom had some challenges with engorgement with one other baby and explained The breast  got warm and hard. Sore nipple and engorgement prevention and tx reviewed.  Mom has a hand pump in package at bedside and per mom aware of how to use it and the  #24 flange was comfortable with her other babies.  LC recommended due to seeing how the baby was positioned when released explored with moms  Positioning options and recommended until the baby develops stronger muscles in his neck to try using the  Cross cradle and switching arms to cradle. Per mom familiar with football also. Per mom has only been  Breast feeding in the cradle position. LC discussed the importance of the baby learning to latch deeply to  Protect milk supply, more volume transferred , prevent sore nipple and engorgement.  Mother informed of post-discharge support and given phone number to the lactation department, including services for phone call assistance; out-patient appointments; and breastfeeding support group. List of other breastfeeding resources in the community given in the handout. Encouraged mother to call for problems or concerns related to breastfeeding.   Maternal Data    Feeding Feeding Type:  (baby just fed ) Length of feed: 20 min  LATCH Score/Interventions Latch: Grasps breast easily, tongue down, lips flanged, rhythmical sucking.  Audible Swallowing: A few with stimulation Intervention(s): Skin to skin  Type of Nipple: Everted at rest and after stimulation  Comfort (Breast/Nipple): Soft / non-tender     Hold (Positioning): No assistance needed to correctly position infant at breast.  LATCH Score: 9  Lactation Tools  Discussed/Used Tools: Pump Breast pump type: Manual (per mom feels comfortable with how to use hand pump )   Consult Status Consult Status: Complete Date: 07/17/16    Grace Wall 07/17/2016, 10:13 AM

## 2016-07-17 NOTE — Discharge Summary (Signed)
Obstetric Discharge Summary Reason for Admission: rupture of membranes and [id number] Prenatal Procedures: ultrasound Intrapartum Procedures: spontaneous vaginal delivery Postpartum Procedures: none Complications-Operative and Postpartum: 1st degree perineal; left sulcal and left vaginal degree perineal laceration Hemoglobin  Date Value Ref Range Status  07/17/2016 8.9 (L) 12.0 - 15.0 g/dL Final   HCT  Date Value Ref Range Status  07/17/2016 26.7 (L) 36.0 - 46.0 % Final   Hematocrit  Date Value Ref Range Status  04/29/2016 32.9 (L) 34.0 - 46.6 % Final    Physical Exam:  General: alert, cooperative and no distress Lochia: appropriate Uterine Fundus: firm Incision: no significant drainage, no dehiscence, no significant erythema DVT Evaluation: No evidence of DVT seen on physical exam. No cords or calf tenderness. No significant calf/ankle edema.  Discharge Diagnoses: Term Pregnancy-delivered and Anemia  Discharge Information: Date: 07/17/2016 Activity: pelvic rest Diet: routine Medications: PNV, Ibuprofen, Colace, Iron and Percocet Condition: stable Instructions: refer to practice specific booklet Discharge to: home Follow-up Information    Keiva Dina A Trianna Lupien, CNM Follow up in 4 week(s).   Specialty:  Certified Nurse Midwife Why:  postpartum exam Contact information: [address] [place] [id number] [phone]           Newborn Data: Live born female  Birth Weight: 7 lb 4 oz (3289 g) APGAR: 6, 9  Home with mother.  Roe Coombsachelle A Doni Widmer, CNM 07/17/2016, 8:47 AM

## 2016-07-21 ENCOUNTER — Encounter: Payer: Medicaid Other | Admitting: Obstetrics and Gynecology

## 2016-07-22 LAB — CANDIDA 6 SPECIES PROFILE, NAA
CANDIDA KRUSEI, NAA: NEGATIVE
CANDIDA PARAPSILOSIS, NAA: NEGATIVE
CANDIDA TROPICALIS, NAA: NEGATIVE
Candida albicans, NAA: POSITIVE — AB
Candida glabrata, NAA: NEGATIVE
Candida lusitaniae, NAA: NEGATIVE

## 2016-07-22 LAB — SPECIMEN STATUS REPORT

## 2016-07-23 ENCOUNTER — Inpatient Hospital Stay (HOSPITAL_COMMUNITY): Admission: RE | Admit: 2016-07-23 | Payer: Medicaid Other | Source: Ambulatory Visit

## 2016-08-18 LAB — T3, FREE: T3, Free: 3.6 pg/mL (ref 2.3–4.2)

## 2016-08-18 LAB — TSH: TSH: 0.05 m[IU]/L — AB

## 2016-08-18 LAB — T4, FREE: FREE T4: 1.4 ng/dL (ref 0.8–1.8)

## 2016-08-21 ENCOUNTER — Telehealth (INDEPENDENT_AMBULATORY_CARE_PROVIDER_SITE_OTHER): Payer: Self-pay | Admitting: "Endocrinology

## 2016-08-21 NOTE — Telephone Encounter (Signed)
1. I called the patient about her new TFT results from 08/17/16. 2. Subjective: She delivered her new baby girl on 07/16/16. She reduced her Synthroid dose to 1.5 of the 112 mcg tablets per day soon thereafter. 3. Objective: TSH 0.05, free T4 1.4, free T3 3.6 4. Assessment: Her current doses of synthroid are too strong for her now. She may be having some residual effect from her higher Synthroid levels that she was on prior to delivery.  5. Plan: Reduce the Synthroid to 1 pill on odd-numbered days and continue 1.5 pills on even-numbered days, which is about a 16% reduction. Repeat TSH in about 8 weeks. Decide about clinic follow up then.  Molli KnockMichael Arlyn Bumpus, MD, CDE

## 2016-09-02 ENCOUNTER — Other Ambulatory Visit (HOSPITAL_COMMUNITY)
Admission: RE | Admit: 2016-09-02 | Discharge: 2016-09-02 | Disposition: A | Discharge status: Home / Self Care | Payer: Medicaid Other | Source: Ambulatory Visit | Attending: Certified Nurse Midwife | Admitting: Certified Nurse Midwife

## 2016-09-02 ENCOUNTER — Ambulatory Visit (INDEPENDENT_AMBULATORY_CARE_PROVIDER_SITE_OTHER): Payer: Medicaid Other | Admitting: Certified Nurse Midwife

## 2016-09-02 ENCOUNTER — Encounter: Payer: Self-pay | Admitting: Certified Nurse Midwife

## 2016-09-02 DIAGNOSIS — Z01419 Encounter for gynecological examination (general) (routine) without abnormal findings: Secondary | ICD-10-CM | POA: Insufficient documentation

## 2016-09-02 DIAGNOSIS — Z1151 Encounter for screening for human papillomavirus (HPV): Secondary | ICD-10-CM | POA: Diagnosis not present

## 2016-09-02 DIAGNOSIS — Z113 Encounter for screening for infections with a predominantly sexual mode of transmission: Secondary | ICD-10-CM | POA: Insufficient documentation

## 2016-09-02 NOTE — Progress Notes (Signed)
Post Partum Exam  Grace Wall is a 31 y.o. [id number] female who presents for a postpartum visit. She is 7 weeks postpartum following a spontaneous vaginal delivery. I have fully reviewed the prenatal and intrapartum course. The delivery was at 40 gestational weeks.  Anesthesia: epidural. Postpartum course has been doing well. Baby's course has been doing well. Baby is feeding by breast. Bleeding no bleeding. Bowel function is normal. Bladder function is normal. Patient is not sexually active. Contraception method is abstinence.  Pt states planning Vasectomy. Postpartum depression screening:neg  The following portions of the patient's history were reviewed and updated as appropriate: allergies, current medications, past family history, past medical history, past social history, past surgical history and problem list.  Review of Systems Pertinent items noted in HPI and remainder of comprehensive ROS otherwise negative.    Objective:    BP 116/78 mmHg  Pulse 78  Resp 16  Ht 5\' 5"  (1.651 m)  Wt 211 lb (95.709 kg)  BMI 35.11 kg/m2  Breastfeeding? Yes  General:  alert, cooperative and no distress   Breasts:  inspection negative, no nipple discharge or bleeding, no masses or nodularity palpable  Lungs: clear to auscultation bilaterally  Heart:  regular rate and rhythm, S1, S2 normal, no murmur, click, rub or gallop  Abdomen: soft, non-tender; bowel sounds normal; no masses,  no organomegaly   Vulva:  normal  Vagina: normal vagina, no discharge, exudate, lesion, or erythema  Cervix:  no bleeding following Pap and no cervical motion tenderness  Corpus: normal size, contour, position, consistency, mobility, non-tender  Adnexa:  normal adnexa  Rectal Exam: Not performed.        Assessment:    Normal 7 week postpartum exam. Pap smear done at today's visit.   Plan:   1. Contraception: abstinence 2. Patient is thinking about an IUD.  Had Mirena in the past and spouse felt the strings.   Spouse is thinking about vasectomy.  Discussed pros and cons.  [facility] and [facility] get  3. Follow up in: PRN if desires IUD.

## 2016-09-04 LAB — CYTOLOGY - PAP
Diagnosis: NEGATIVE
HPV: NOT DETECTED

## 2016-09-04 LAB — CERVICOVAGINAL ANCILLARY ONLY
BACTERIAL VAGINITIS: POSITIVE — AB
Candida vaginitis: NEGATIVE
Chlamydia: NEGATIVE
Neisseria Gonorrhea: NEGATIVE
Trichomonas: NEGATIVE

## 2016-09-08 ENCOUNTER — Other Ambulatory Visit: Payer: Self-pay | Admitting: Certified Nurse Midwife

## 2016-09-08 DIAGNOSIS — N760 Acute vaginitis: Principal | ICD-10-CM

## 2016-09-08 DIAGNOSIS — B9689 Other specified bacterial agents as the cause of diseases classified elsewhere: Secondary | ICD-10-CM

## 2016-09-08 MED ORDER — METRONIDAZOLE 500 MG PO TABS: 500.0000 mg | ORAL_TABLET | Freq: Two times a day (BID) | ORAL | 0 refills | Status: DC

## 2016-09-16 ENCOUNTER — Ambulatory Visit (INDEPENDENT_AMBULATORY_CARE_PROVIDER_SITE_OTHER): Payer: Medicaid Other | Admitting: "Endocrinology

## 2016-10-12 ENCOUNTER — Other Ambulatory Visit (INDEPENDENT_AMBULATORY_CARE_PROVIDER_SITE_OTHER): Payer: Self-pay | Admitting: "Endocrinology

## 2016-10-12 DIAGNOSIS — E063 Autoimmune thyroiditis: Secondary | ICD-10-CM

## 2016-10-21 ENCOUNTER — Other Ambulatory Visit (INDEPENDENT_AMBULATORY_CARE_PROVIDER_SITE_OTHER): Payer: Self-pay | Admitting: "Endocrinology

## 2016-10-21 DIAGNOSIS — E063 Autoimmune thyroiditis: Secondary | ICD-10-CM

## 2016-10-22 LAB — T3, FREE: T3, Free: 2.7 pg/mL (ref 2.3–4.2)

## 2016-10-22 LAB — TSH: TSH: 2.17 mIU/L

## 2016-10-22 LAB — T4, FREE: Free T4: 1 ng/dL (ref 0.8–1.8)

## 2016-11-02 ENCOUNTER — Inpatient Hospital Stay (HOSPITAL_COMMUNITY): Payer: Medicaid Other

## 2016-11-02 ENCOUNTER — Encounter (HOSPITAL_COMMUNITY): Payer: Self-pay | Admitting: *Deleted

## 2016-11-02 ENCOUNTER — Inpatient Hospital Stay (HOSPITAL_COMMUNITY)
Admission: AD | Admit: 2016-11-02 | Discharge: 2016-11-02 | Disposition: A | Discharge status: Home / Self Care | Payer: Medicaid Other | Source: Ambulatory Visit | Attending: Family Medicine | Admitting: Family Medicine

## 2016-11-02 DIAGNOSIS — R112 Nausea with vomiting, unspecified: Secondary | ICD-10-CM | POA: Diagnosis present

## 2016-11-02 DIAGNOSIS — R102 Pelvic and perineal pain: Secondary | ICD-10-CM | POA: Insufficient documentation

## 2016-11-02 DIAGNOSIS — K5900 Constipation, unspecified: Secondary | ICD-10-CM | POA: Diagnosis not present

## 2016-11-02 DIAGNOSIS — N859 Noninflammatory disorder of uterus, unspecified: Secondary | ICD-10-CM | POA: Diagnosis not present

## 2016-11-02 DIAGNOSIS — E039 Hypothyroidism, unspecified: Secondary | ICD-10-CM | POA: Insufficient documentation

## 2016-11-02 DIAGNOSIS — M545 Low back pain: Secondary | ICD-10-CM | POA: Diagnosis present

## 2016-11-02 LAB — COMPREHENSIVE METABOLIC PANEL
ALT: 41 U/L (ref 14–54)
AST: 30 U/L (ref 15–41)
Albumin: 4 g/dL (ref 3.5–5.0)
Alkaline Phosphatase: 45 U/L (ref 38–126)
Anion gap: 8 (ref 5–15)
BILIRUBIN TOTAL: 0.6 mg/dL (ref 0.3–1.2)
BUN: 16 mg/dL (ref 6–20)
CALCIUM: 9 mg/dL (ref 8.9–10.3)
CHLORIDE: 105 mmol/L (ref 101–111)
CO2: 26 mmol/L (ref 22–32)
CREATININE: 0.81 mg/dL (ref 0.44–1.00)
GFR calc Af Amer: >60 mL/min (ref >60)
GFR calc non Af Amer: >60 mL/min (ref >60)
GLUCOSE: 115 mg/dL — AB (ref 65–99)
POTASSIUM: 3.7 mmol/L (ref 3.5–5.1)
Sodium: 139 mmol/L (ref 135–145)
TOTAL PROTEIN: 7.1 g/dL (ref 6.5–8.1)

## 2016-11-02 LAB — CBC
HEMATOCRIT: 35.9 % — AB (ref 36.0–46.0)
Hemoglobin: 12 g/dL (ref 12.0–15.0)
MCH: 29.1 pg (ref 26.0–34.0)
MCHC: 33.4 g/dL (ref 30.0–36.0)
MCV: 86.9 fL (ref 78.0–100.0)
PLATELETS: 133 10*3/uL — AB (ref 150–400)
RBC: 4.13 MIL/uL (ref 3.87–5.11)
RDW: 13.6 % (ref 11.5–15.5)
WBC: 6 10*3/uL (ref 4.0–10.5)

## 2016-11-02 LAB — POCT PREGNANCY, URINE: PREG TEST UR: NEGATIVE

## 2016-11-02 MED ORDER — KETOROLAC TROMETHAMINE 60 MG/2ML IM SOLN
60.0000 mg | Freq: Once | INTRAMUSCULAR | Status: AC
  Administered 2016-11-02: 60 mg via INTRAMUSCULAR
  Filled 2016-11-02: qty 2

## 2016-11-02 MED ORDER — PROMETHAZINE HCL 25 MG/ML IJ SOLN
25.0000 mg | Freq: Once | INTRAMUSCULAR | Status: AC
  Administered 2016-11-02: 25 mg via INTRAMUSCULAR
  Filled 2016-11-02: qty 1

## 2016-11-02 MED ORDER — HYDROCODONE-ACETAMINOPHEN 5-325 MG PO TABS: 1.0000 | ORAL_TABLET | ORAL | 0 refills | Status: DC | PRN

## 2016-11-02 NOTE — MAU Note (Addendum)
Pt reports that she woke up in severe pain. Pt describes the pain as contraction like in nature. Pt is located on L side of back and abdomen. Pt also has vomited x2. Pt also reports tingling in her R arm about once a day.

## 2016-11-02 NOTE — MAU Provider Note (Signed)
History     CSN: [id number]  Arrival date and time: 11/02/16 0246   First Provider Initiated Contact with Patient 11/02/16 0327      Chief Complaint  Patient presents with  . Abdominal Pain   Grace Wall is a 31 y.o. [phone] who presents today with LLQ and lower back pain x3 hours. She also has nausea and vomiting. She reports an ongoing issue with constipation, and last had a BM on 10/31/16. She denies any diarrhea or sick contracts.    Abdominal Pain  This is a new problem. The current episode started today. The onset quality is sudden. The problem occurs constantly. The problem has been unchanged. The pain is located in the LLQ. The pain is at a severity of 10/10. The quality of the pain is sharp and cramping. The abdominal pain radiates to the back. Associated symptoms include constipation, nausea and vomiting. Pertinent negatives include no diarrhea, dysuria, fever or hematuria. Nothing aggravates the pain. The pain is relieved by nothing. Treatments tried: ibuprofen  The treatment provided no relief.   Past Medical History:  Diagnosis Date  . Exophthalmos   . Hypothyroidism, acquired, autoimmune   . Supervision of low-risk pregnancy 11/28/2015    Clinic  [facility] Prenatal Labs Dating  LMP Blood type: O/Positive/-- (05/11 1044)  Genetic Screen 1 Screen: negative   AFP:     Quad:     NIPS: Antibody:Negative (05/11 1044) Anatomic [place]  normal female fetus Rubella: 30.10 (05/11 1044) GTT Third trimester: normal RPR: Non Reactive (10/11 1443)  Flu vaccine  05/11/16 HBsAg: Negative (05/11 1044)  TDaP vaccine   05/11/16                              HIV: Non Reactive (10/11 1443)  Baby Food  breast                          GBS:  Contraception undecided Pap: Negative (08/29/2015) Circumcision N/a female  Pediatrician  [facility]  Support Person husband     . Thyroiditis, autoimmune     Past Surgical History:  Procedure Laterality Date  . NO PAST SURGERIES      Family History   Problem Relation Age of Onset  . Cancer Maternal Aunt   . Thyroid disease Son   . Diabetes Mother   . Hypertension Father     Social History  Substance Use Topics  . Smoking status: Never Smoker  . Smokeless tobacco: Never Used  . Alcohol use No     Comment: ocassionally    Allergies: No Known Allergies  Prescriptions Prior to Admission  Medication Sig Dispense Refill Last Dose  . ibuprofen (ADVIL,MOTRIN) 600 MG tablet Take 600 mg by mouth every 6 (six) hours as needed.   11/02/2016 at 0100  . Prenatal Vit-Fe Fumarate-FA (PRENATAL MULTIVITAMIN) TABS tablet Take 1 tablet by mouth daily at 12 noon.   11/01/2016 at Unknown time  . metroNIDAZOLE (FLAGYL) 500 MG tablet Take 1 tablet (500 mg total) by mouth 2 (two) times daily. 14 tablet 0   . SYNTHROID 112 MCG tablet TAKE 2 TABLETS BY MOUTH EVERY DAY 60 tablet 6     Review of Systems  Constitutional: Negative for chills and fever.  Gastrointestinal: Positive for abdominal pain, constipation, nausea and vomiting. Negative for diarrhea.  Genitourinary: Negative for dysuria, hematuria, urgency, vaginal bleeding and vaginal discharge.  Physical Exam   Blood pressure 125/74, pulse 69, temperature 98.3 F (36.8 C), temperature source Oral, resp. rate 16, height  (1.651 m), weight 74.8 kg (165 lb), unknown if currently breastfeeding.  Physical Exam  Nursing note and vitals reviewed. Constitutional: She is oriented to person, place, and time. She appears well-developed and well-nourished. No distress (appears uncomfortable ).  HENT:  Head: Normocephalic.  Cardiovascular: Normal rate.   Respiratory: Effort normal.  GI: Soft. There is no tenderness. There is no rebound.  Genitourinary:  Genitourinary Comments: No CVA tenderness   Neurological: She is alert and oriented to person, place, and time.  Skin: Skin is warm and dry.  Psychiatric: She has a normal mood and affect.   Results for orders placed or performed during the  hospital encounter of 11/02/16 (from the past 24 hour(s))  Pregnancy, urine POC     Status: None   Collection Time: 11/02/16  3:12 AM  Result Value Ref Range   Preg Test, Ur NEGATIVE NEGATIVE  CBC     Status: Abnormal   Collection Time: 11/02/16  3:46 AM  Result Value Ref Range   WBC 6.0 4.0 - 10.5 K/uL   RBC 4.13 3.87 - 5.11 MIL/uL   Hemoglobin 12.0 12.0 - 15.0 g/dL   HCT 86.5 (L) 78.4 - 69.6 %   MCV 86.9 78.0 - 100.0 fL   MCH 29.1 26.0 - 34.0 pg   MCHC 33.4 30.0 - 36.0 g/dL   RDW 29.5 28.4 - 13.2 %   Platelets 133 (L) 150 - 400 K/uL  Comprehensive metabolic panel     Status: Abnormal   Collection Time: 11/02/16  3:46 AM  Result Value Ref Range   Sodium 139 135 - 145 mmol/L   Potassium 3.7 3.5 - 5.1 mmol/L   Chloride 105 101 - 111 mmol/L   CO2 26 22 - 32 mmol/L   Glucose, Bld 115 (H) 65 - 99 mg/dL   BUN 16 6 - 20 mg/dL   Creatinine, Ser 4.40 0.44 - 1.00 mg/dL   Calcium 9.0 8.9 - 10.2 mg/dL   Total Protein 7.1 6.5 - 8.1 g/dL   Albumin 4.0 3.5 - 5.0 g/dL   AST 30 15 - 41 U/L   ALT 41 14 - 54 U/L   Alkaline Phosphatase 45 38 - 126 U/L   Total Bilirubin 0.6 0.3 - 1.2 mg/dL   GFR calc non Af Amer >60 >60 mL/min   GFR calc Af Amer >60 >60 mL/min   Anion gap 8 5 - 15   US Transvaginal Non-ob  Result Date: 11/02/2016 CLINICAL DATA:  Pelvic pain. EXAM: TRANSABDOMINAL AND TRANSVAGINAL ULTRASOUND OF PELVIS TECHNIQUE: Both transabdominal and transvaginal ultrasound examinations of the pelvis were performed. Transabdominal technique was performed for global imaging of the pelvis including uterus, ovaries, adnexal regions, and pelvic cul-de-sac. It was necessary to proceed with endovaginal exam following the transabdominal exam to visualize the ovaries and adnexa. COMPARISON:  Pelvic ultrasound 06/02/2016 FINDINGS: Uterus Measurements: 8.1 x 4.1 x 6.4 cm. The uterus has arcuate versus septate morphology. There are multiple cystic foci within the uterine walls. No fibroids or other mass  visualized. Endometrium Thickness: 3 mm.  No focal abnormality visualized. Right ovary Measurements: 2.6 x 1.9 x 2.1 cm. Normal appearance/no adnexal mass. Left ovary Measurements: 3.4 x 1.7 x 1.9 cm. Normal appearance/no adnexal mass. Other findings No abnormal free fluid. IMPRESSION: 1. No acute pelvic abnormality. 2. Arcuate versus septate uterine morphology. If there is clinical  need for distinction, MRI of the pelvis would be helpful. 3. Multiple cystic foci within the uterine wall is. This is somewhat nonspecific, but might represent adenomyosis. Nonemergent pelvic MRI could be obtained for clarification, if clinically indicated. Electronically Signed   By: Deatra Robinson M.D.   On: 11/02/2016 05:03   US Pelvis Complete  Result Date: 11/02/2016 CLINICAL DATA:  Pelvic pain. EXAM: TRANSABDOMINAL AND TRANSVAGINAL ULTRASOUND OF PELVIS TECHNIQUE: Both transabdominal and transvaginal ultrasound examinations of the pelvis were performed. Transabdominal technique was performed for global imaging of the pelvis including uterus, ovaries, adnexal regions, and pelvic cul-de-sac. It was necessary to proceed with endovaginal exam following the transabdominal exam to visualize the ovaries and adnexa. COMPARISON:  Pelvic ultrasound 06/02/2016 FINDINGS: Uterus Measurements: 8.1 x 4.1 x 6.4 cm. The uterus has arcuate versus septate morphology. There are multiple cystic foci within the uterine walls. No fibroids or other mass visualized. Endometrium Thickness: 3 mm.  No focal abnormality visualized. Right ovary Measurements: 2.6 x 1.9 x 2.1 cm. Normal appearance/no adnexal mass. Left ovary Measurements: 3.4 x 1.7 x 1.9 cm. Normal appearance/no adnexal mass. Other findings No abnormal free fluid. IMPRESSION: 1. No acute pelvic abnormality. 2. Arcuate versus septate uterine morphology. If there is clinical need for distinction, MRI of the pelvis would be helpful. 3. Multiple cystic foci within the uterine wall is. This is  somewhat nonspecific, but might represent adenomyosis. Nonemergent pelvic MRI could be obtained for clarification, if clinically indicated. Electronically Signed   By: Deatra Robinson M.D.   On: 11/02/2016 05:03     MAU Course  Procedures  MDM  Patient has had toradol and phenergan. She reports feeling better at this time.  Assessment and Plan   1. Pelvic pain    DC home Comfort measures reviewed  RX: vicodin PRN #6  Return to MAU as needed  Follow-up Information    [facility].   Specialty:  Obstetrics and Gynecology Contact information: [address] [place] [address] [phone]           Tawnya Crook 11/02/2016, 3:35 AM

## 2016-11-02 NOTE — Discharge Instructions (Signed)
Pelvic Pain, Female °Pelvic pain is pain in your lower belly (abdomen), below your belly button and between your hips. The pain may start suddenly (acute), keep coming back (recurring), or last a long time (chronic). Pelvic pain that lasts longer than six months is considered chronic. There are many causes of pelvic pain. Sometimes the cause of your pelvic pain is not known. °Follow these instructions at home: °· Take over-the-counter and prescription medicines only as told by your doctor. °· Rest as told by your doctor. °· Do not have sex it if hurts. °· Keep a journal of your pelvic pain. Write down: °¨ When the pain started. °¨ Where the pain is located. °¨ What seems to make the pain better or worse, such as food or your menstrual cycle. °¨ Any symptoms you have along with the pain. °· Keep all follow-up visits as told by your doctor. This is important. °Contact a doctor if: °· Medicine does not help your pain. °· Your pain comes back. °· You have new symptoms. °· You have unusual vaginal discharge or bleeding. °· You have a fever or chills. °· You are having a hard time pooping (constipation). °· You have blood in your pee (urine) or poop (stool). °· Your pee smells bad. °· You feel weak or lightheaded. °Get help right away if: °· You have sudden pain that is very bad. °· Your pain continues to get worse. °· You have very bad pain and also have any of the following symptoms: °¨ A fever. °¨ Feeling stick to your stomach (nausea). °¨ Throwing up (vomiting). °¨ Being very sweaty. °· You pass out (lose consciousness). °This information is not intended to replace advice given to you by your health care provider. Make sure you discuss any questions you have with your health care provider. °Document Released: 12/23/2007 Document Revised: 07/31/2015 Document Reviewed: 04/26/2015 °Elsevier Interactive Patient Education © 2017 [employer] Inc[employer] ° °

## 2016-11-03 ENCOUNTER — Telehealth (INDEPENDENT_AMBULATORY_CARE_PROVIDER_SITE_OTHER): Payer: Self-pay | Admitting: "Endocrinology

## 2016-11-03 NOTE — Telephone Encounter (Signed)
1. When I called [name] earlier about her child's LT4 dose, she asked me about her own lab results. 2. When I checked the lab results for her the labs have not yet resulted.  3. I gave this information to our lab tech who will try to find out what happened to the results. She will let me know and I will call the patient.  4.. I called the patient and gave her this information.  Grace Wall

## 2016-11-09 ENCOUNTER — Encounter (INDEPENDENT_AMBULATORY_CARE_PROVIDER_SITE_OTHER): Payer: Self-pay | Admitting: "Endocrinology

## 2016-11-12 ENCOUNTER — Other Ambulatory Visit (INDEPENDENT_AMBULATORY_CARE_PROVIDER_SITE_OTHER): Payer: Self-pay | Admitting: "Endocrinology

## 2016-11-12 ENCOUNTER — Telehealth (INDEPENDENT_AMBULATORY_CARE_PROVIDER_SITE_OTHER): Payer: Self-pay | Admitting: "Endocrinology

## 2016-11-12 DIAGNOSIS — E063 Autoimmune thyroiditis: Secondary | ICD-10-CM

## 2016-11-12 NOTE — Telephone Encounter (Signed)
1. I called the patient to discuss her recent TFT results. She needs a very small increase in thyroid hormone.  2. She was out of the home and could not remember what doses she is taking.  3. I asked her to call me back with the doses.  We will then discuss how to adjust her doses. 4. We may or may not then re-schedule her appointment for tomorrow.  Molli Knock. MD, CDE

## 2016-11-12 NOTE — Telephone Encounter (Signed)
1. I called Ms. Fonvile to discuss her thyroid hormone dosage. 2. Subjective: She is feeling good. She is taking one 112 mcg Synthroid tablet on one day and 1.5 tablets on the following day in alternation.  In the past month she had had some tingling in her right hand and sometimes in her forearm.  3. Objective: Her recent TSH was 2.17, free T4 1.0, and free T3 2.7. 4. Assessment:   A. She is euthyroid on her current Synthroid dose  B. She may have a pinched nerve in her neck or perhaps some compression of a nerve at or below the level of the right elbow.  5. Plan:  A. We can cancel her appointment for tomorrow. I asked her to call the clinic tomorrow and re-schedule an appointment in 3 months. I also asked her to repeat her thyroid tests one week prior to her next visit.  B. I recommended that she make an appointment with her new PCP to discuss the tingling issue.  Molli Knock, MD, CDE

## 2016-11-13 ENCOUNTER — Ambulatory Visit (INDEPENDENT_AMBULATORY_CARE_PROVIDER_SITE_OTHER): Payer: Self-pay | Admitting: "Endocrinology

## 2017-02-02 ENCOUNTER — Other Ambulatory Visit (INDEPENDENT_AMBULATORY_CARE_PROVIDER_SITE_OTHER): Payer: Self-pay | Admitting: *Deleted

## 2017-02-02 DIAGNOSIS — E063 Autoimmune thyroiditis: Secondary | ICD-10-CM

## 2017-02-02 MED ORDER — SYNTHROID 112 MCG PO TABS: ORAL_TABLET | ORAL | 6 refills | Status: DC

## 2017-02-08 ENCOUNTER — Other Ambulatory Visit (INDEPENDENT_AMBULATORY_CARE_PROVIDER_SITE_OTHER): Payer: Self-pay | Admitting: *Deleted

## 2017-02-08 ENCOUNTER — Encounter (INDEPENDENT_AMBULATORY_CARE_PROVIDER_SITE_OTHER): Payer: Self-pay | Admitting: "Endocrinology

## 2017-02-08 DIAGNOSIS — E063 Autoimmune thyroiditis: Secondary | ICD-10-CM

## 2017-03-05 ENCOUNTER — Telehealth (INDEPENDENT_AMBULATORY_CARE_PROVIDER_SITE_OTHER): Payer: Self-pay | Admitting: "Endocrinology

## 2017-03-05 DIAGNOSIS — E063 Autoimmune thyroiditis: Secondary | ICD-10-CM

## 2017-03-05 LAB — T4, FREE: FREE T4: 1.2 ng/dL (ref 0.8–1.8)

## 2017-03-05 LAB — TSH: TSH: 9 m[IU]/L — AB

## 2017-03-05 LAB — T3, FREE: T3, Free: 2.5 pg/mL (ref 2.3–4.2)

## 2017-03-05 NOTE — Telephone Encounter (Signed)
1. When I saw her two today, mom asked me to look at her TFT results and contact her.  2. Subjective: She is taking Synthroid, 112 mcg/day alternating with 168 mcg/day (1.5 of the 112 mcg pills/day). 3. Objective:  Her TSH was 9.0, free T4 was 1.2, free T3 was 2.5.  4. Assessment: She needs more Synthroid. 5. Plan: Increase the Synthroid dose to 2 of the 112 pills per day alternating with 1.5 of the 112 mcg pills per day. We need to repeat her TFTs in 2-3 months.  Molli Knock, MD, CDE

## 2017-05-27 ENCOUNTER — Encounter (INDEPENDENT_AMBULATORY_CARE_PROVIDER_SITE_OTHER): Payer: Self-pay | Admitting: "Endocrinology

## 2017-05-27 DIAGNOSIS — E063 Autoimmune thyroiditis: Secondary | ICD-10-CM

## 2017-05-28 NOTE — Telephone Encounter (Signed)
I called and spoke with Grace Wall. She asked if she could come in to have labs done next week since it had been two months since she started on the new dose of synthroid.  Per Dr. Audie ClearBrennans chart he did want her to have labs drawn 2-3 months post new dose.  She stated she would come in Monday.  She also asked if we could make sure to send in a new Rx once the labs are done to patient assistance based labs values.  If Dr. Fransico MichaelBrennan does not change her dose she still needs a new Rx sent.

## 2017-06-24 ENCOUNTER — Other Ambulatory Visit (INDEPENDENT_AMBULATORY_CARE_PROVIDER_SITE_OTHER): Payer: Self-pay | Admitting: *Deleted

## 2017-06-24 ENCOUNTER — Encounter (INDEPENDENT_AMBULATORY_CARE_PROVIDER_SITE_OTHER): Payer: Self-pay | Admitting: *Deleted

## 2017-06-24 DIAGNOSIS — E063 Autoimmune thyroiditis: Secondary | ICD-10-CM

## 2017-06-24 LAB — T3, FREE: T3 FREE: 3.4 pg/mL (ref 2.3–4.2)

## 2017-06-24 LAB — T4, FREE: Free T4: 1.3 ng/dL (ref 0.8–1.8)

## 2017-06-24 LAB — TSH: TSH: 0.05 mIU/L — ABNORMAL LOW

## 2017-06-24 MED ORDER — LEVOTHYROXINE SODIUM 112 MCG PO TABS: ORAL_TABLET | ORAL | 6 refills | Status: DC

## 2017-06-24 NOTE — Telephone Encounter (Signed)
LVM advising patient that [facility].

## 2017-06-25 ENCOUNTER — Other Ambulatory Visit (INDEPENDENT_AMBULATORY_CARE_PROVIDER_SITE_OTHER): Payer: Self-pay | Admitting: *Deleted

## 2017-06-25 ENCOUNTER — Other Ambulatory Visit: Payer: Self-pay | Admitting: Certified Nurse Midwife

## 2017-11-25 IMAGING — US US PELVIS COMPLETE
1 series · 15 of 25 positions shown · non-contrast
Comparison: Pelvic ultrasound 06/02/2016

CLINICAL DATA: Pelvic pain.

EXAM:
TRANSABDOMINAL AND TRANSVAGINAL ULTRASOUND OF PELVIS
TECHNIQUE: Both transabdominal and transvaginal ultrasound examinations of the
pelvis were performed. Transabdominal technique was performed for
global imaging of the pelvis including uterus, ovaries, adnexal
regions, and pelvic cul-de-sac. It was necessary to proceed with
endovaginal exam following the transabdominal exam to visualize the
ovaries and adnexa.

[Series 1: us pelvis complete · 15 of 78 slices shown]
[im 1/78]
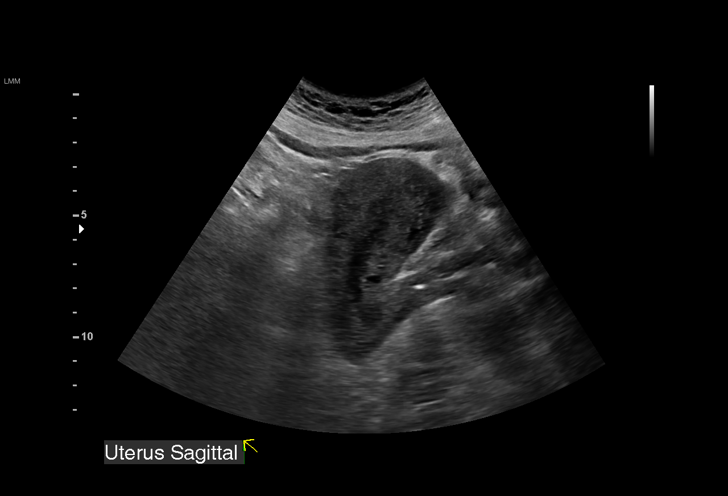
[im 7/78]
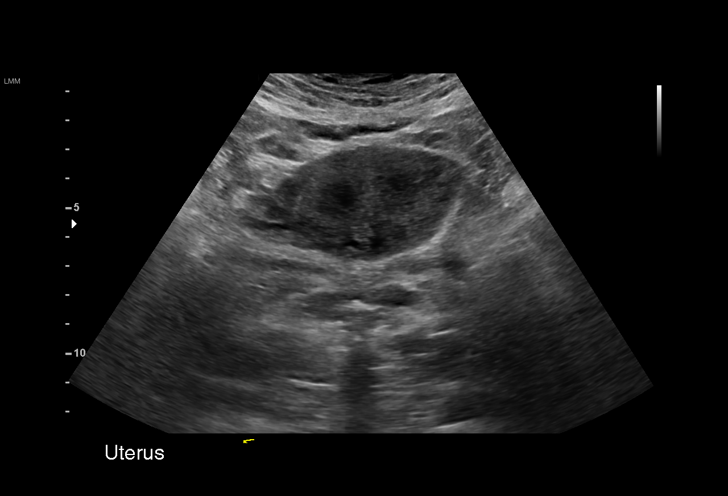
[im 13/78]
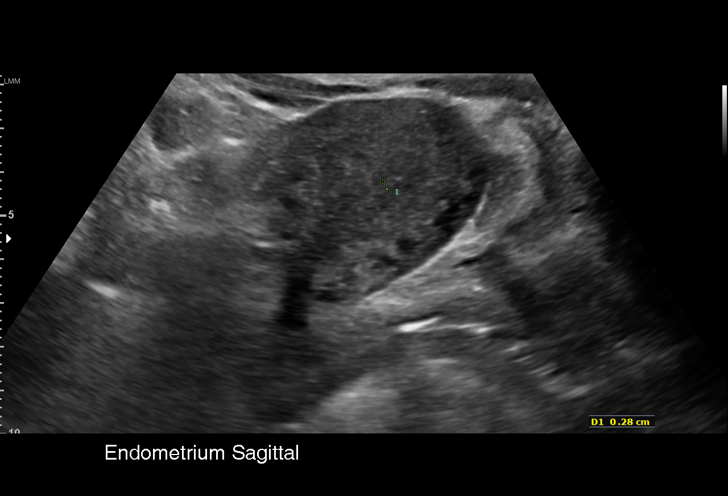
[im 17/78]
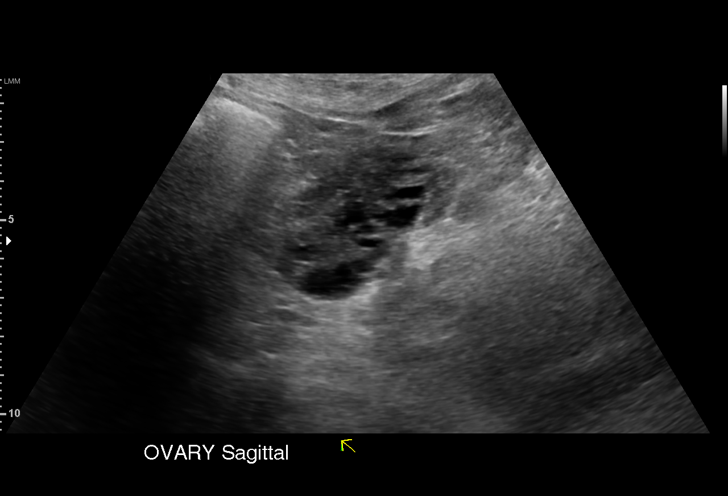
[im 23/78]
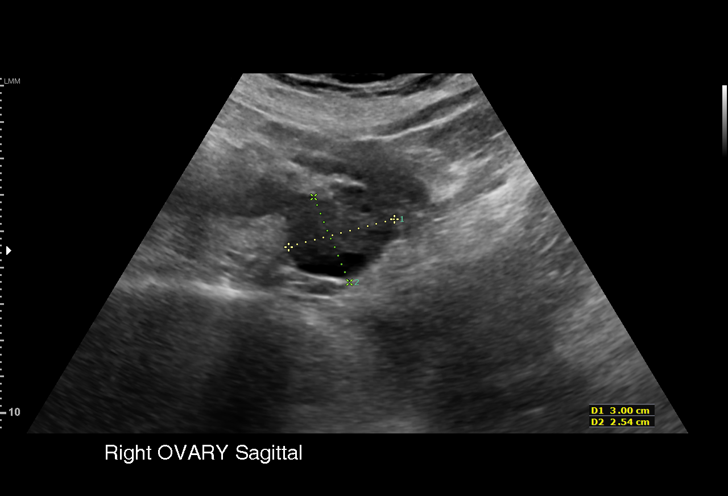
[im 29/78]
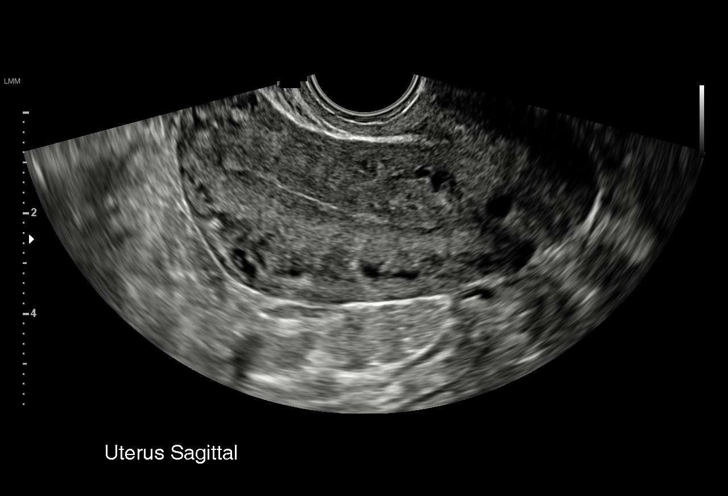
[im 33/78]
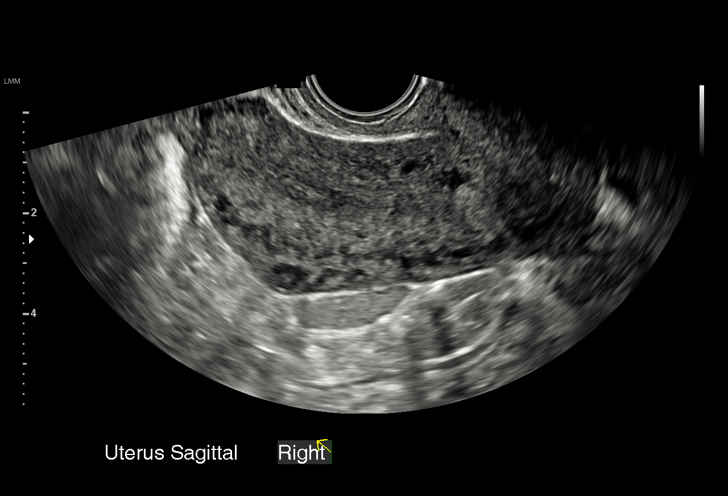
[im 39/78]
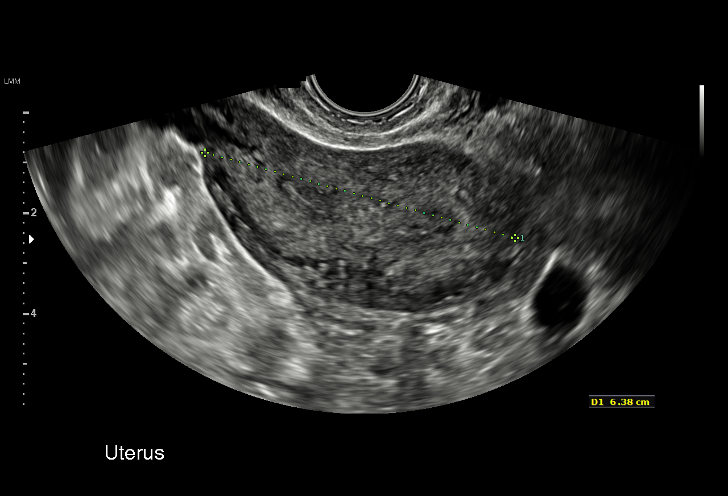
[im 45/78]
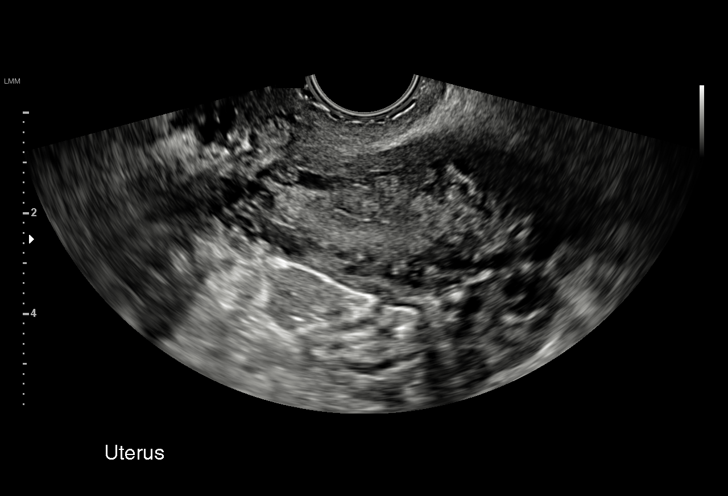
[im 49/78]
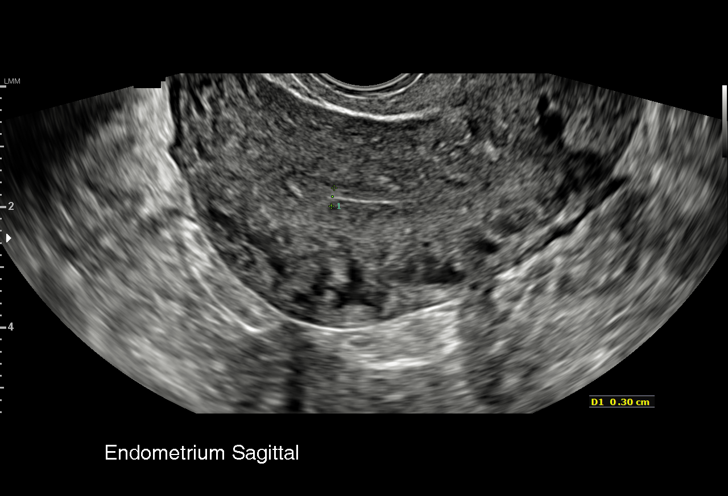
[im 55/78]
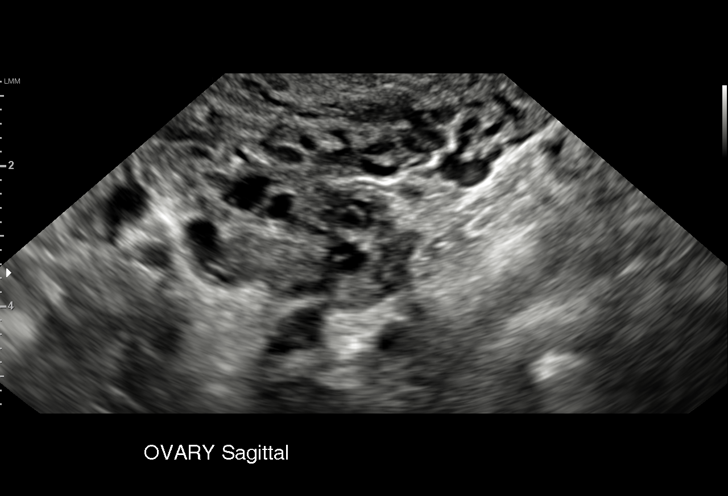
[im 61/78]
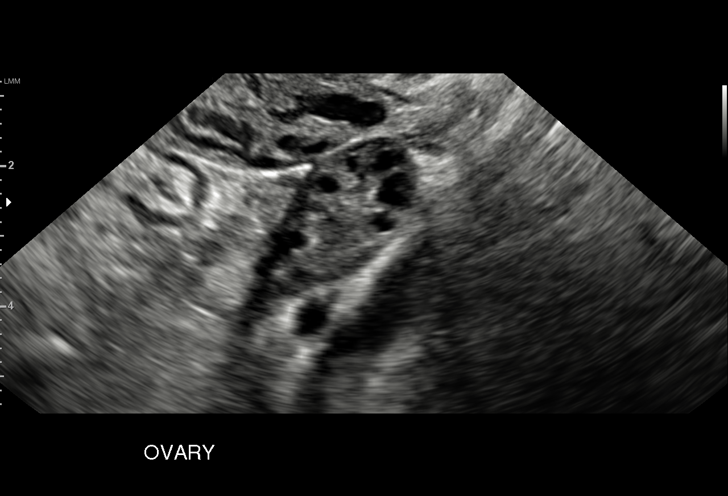
[im 65/78]
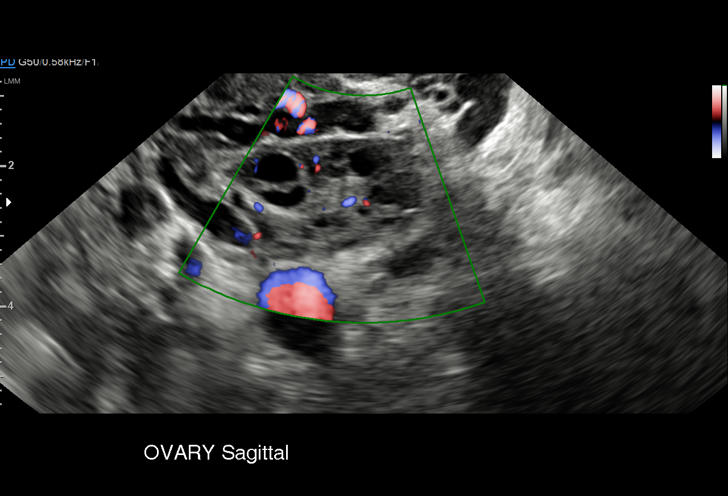
[im 71/78]
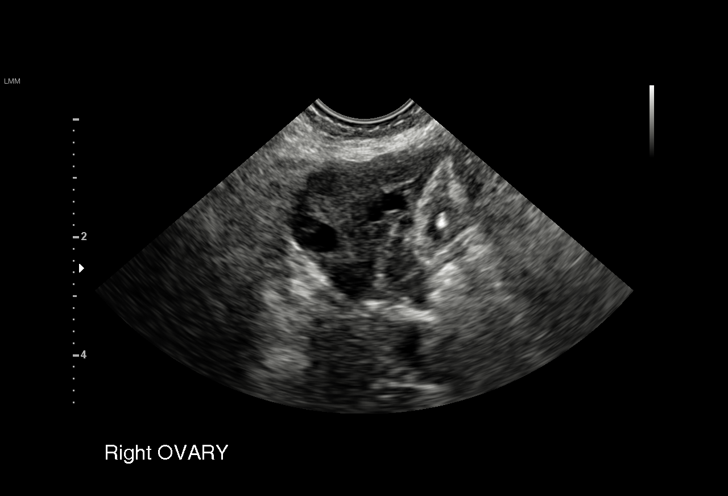
[im 78/78]
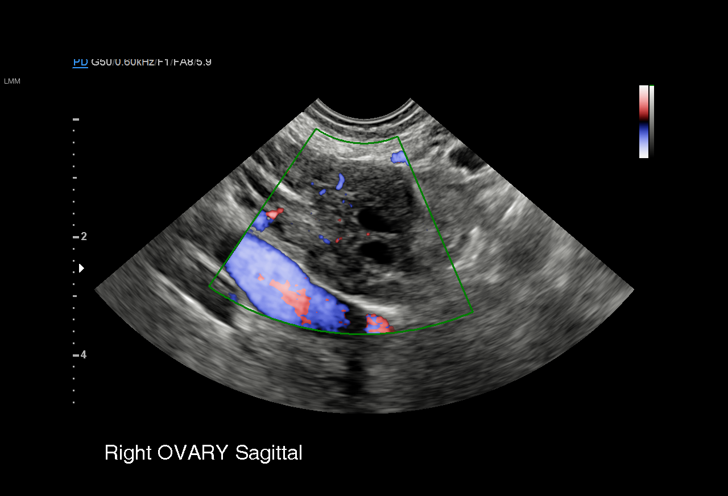

[15 of 25 positions shown; findings below may reference images not displayed]

FINDINGS: Uterus

Measurements: 8.1 x 4.1 x 6.4 cm. The uterus has arcuate versus
septate morphology. There are multiple cystic foci within the
uterine walls. No fibroids or other mass visualized.

Endometrium

Thickness: 3 mm.  No focal abnormality visualized.

Right ovary

Measurements: 2.6 x 1.9 x 2.1 cm. Normal appearance/no adnexal mass.

Left ovary

Measurements: 3.4 x 1.7 x 1.9 cm. Normal appearance/no adnexal mass.

Other findings

No abnormal free fluid.
IMPRESSION: 1. No acute pelvic abnormality.
2. Arcuate versus septate uterine morphology. If there is clinical
need for distinction, MRI of the pelvis would be helpful.
3. Multiple cystic foci within the uterine wall is. This is somewhat
nonspecific, but might represent adenomyosis. Nonemergent pelvic MRI
could be obtained for clarification, if clinically indicated.

## 2017-12-17 ENCOUNTER — Other Ambulatory Visit (INDEPENDENT_AMBULATORY_CARE_PROVIDER_SITE_OTHER): Payer: Self-pay | Admitting: *Deleted

## 2017-12-17 DIAGNOSIS — E063 Autoimmune thyroiditis: Secondary | ICD-10-CM

## 2018-01-06 LAB — T4, FREE: Free T4: 1.4 ng/dL (ref 0.8–1.8)

## 2018-01-06 LAB — TSH: TSH: 0.83 mIU/L

## 2018-01-06 LAB — T3, FREE: T3, Free: 3.1 pg/mL (ref 2.3–4.2)

## 2018-01-11 ENCOUNTER — Other Ambulatory Visit: Payer: Self-pay | Admitting: Certified Nurse Midwife

## 2018-01-11 ENCOUNTER — Encounter (INDEPENDENT_AMBULATORY_CARE_PROVIDER_SITE_OTHER): Payer: Self-pay | Admitting: *Deleted

## 2018-01-11 DIAGNOSIS — E063 Autoimmune thyroiditis: Secondary | ICD-10-CM

## 2018-02-24 ENCOUNTER — Encounter (INDEPENDENT_AMBULATORY_CARE_PROVIDER_SITE_OTHER): Payer: Self-pay | Admitting: "Endocrinology

## 2018-02-24 ENCOUNTER — Ambulatory Visit (INDEPENDENT_AMBULATORY_CARE_PROVIDER_SITE_OTHER): Payer: Self-pay | Admitting: "Endocrinology

## 2018-02-24 VITALS — BP 118/58 | HR 72 | Ht 65.95 in | Wt 155.4 lb

## 2018-02-24 DIAGNOSIS — H0520 Unspecified exophthalmos: Secondary | ICD-10-CM

## 2018-02-24 DIAGNOSIS — E663 Overweight: Secondary | ICD-10-CM

## 2018-02-24 DIAGNOSIS — I959 Hypotension, unspecified: Secondary | ICD-10-CM

## 2018-02-24 DIAGNOSIS — E063 Autoimmune thyroiditis: Secondary | ICD-10-CM

## 2018-02-24 DIAGNOSIS — E349 Endocrine disorder, unspecified: Secondary | ICD-10-CM

## 2018-02-24 DIAGNOSIS — R5383 Other fatigue: Secondary | ICD-10-CM

## 2018-02-24 DIAGNOSIS — E0500 Thyrotoxicosis with diffuse goiter without thyrotoxic crisis or storm: Secondary | ICD-10-CM

## 2018-02-24 MED ORDER — SYNTHROID 112 MCG PO TABS: ORAL_TABLET | ORAL | 3 refills | Status: DC

## 2018-02-24 NOTE — Patient Instructions (Signed)
Follow up visit in one year. Please repeat thyroid tests in February and August 2020.

## 2018-02-24 NOTE — Progress Notes (Signed)
Subjective:  Patient Name: Grace Wall Date of Birth: 06/06/1986  MRN: [id number]  Kaiulani Sitton  presents to the office today for follow-up of her diffuse toxic goiter, hypothyroidism, Hashimoto's disease, exophthalmos, and obesity.  HISTORY OF PRESENT ILLNESS:   Grace Wall is a 32 y.o. [place] young woman. Emmanuelle was accompanied by her daughter.   1. The patient was first referred to me by herself on 05/08/05.   A. She was diagnosed with thyroid problems at age 59. At that point she was hyperthyroid, had a big anterior neck, and had big eyes, so she presumably had Graves' disease. She was placed on Tapazole (methimazole). Tapazole was subsequently stopped at about age 46.  B. About one year later, she became hypothyroid, presumably due to coexistent Hashimoto's disease. She started Synthroid. The Synthroid was changed to generic levothyroxine in 2005. The patient's eyes had gradually improved over time. At that first visit with me, she was feeling fairly well, but was tired a lot. She was also somewhat mentally confused at times.   C. On physical examination she had intermittent proptosis bilaterally. The thyroid gland was 20+ grams in size. The right lobe was within normal limits, but the left lobe was slightly enlarged. Laboratory data showed a TSH of 3.09, free T4 1.22, and free T3 of 2.6. Her TPO antibody level was 1171.1, with normals being less than 60. A thyroid-stimulating immunoglobulin level was 64, with normals being <129. The thyroid binding inhibitory immunoglobulin was 96, with normals being less than 17. Her TSH receptor antibody was 87, with normals being less than 10. 2. Given the high levels of the thyroid receptor antibody, it was unclear how her thyroid tests would evolve. Repeat thyroid hormone tests performed on 05/18/2005 were markedly different. At this time the TSH was 0.032,  free T4 was 1.77, and free T3 was 3.9. At that point it was still unclear whether the fluctuations in  thyroid hormone were due to changes in the forms of levothyroxine made by different companies, or to  changes in thyroid receptor antibody levels, or both. I changed her to brand Synthroid at a dose of 150 mcg per day.   2. During the past 13 years we have followed her for several problems:  A. We've had to adjust her thyroid hormone doses frequently. Her TSH values have ranged from 0.04 - 8.58. Part of these fluctuations have been due to flare ups of Hashimoto's disease. In addition, because she has not had any health insurance during most of this time, she's often had to rely on thyroid hormone samples. Sometimes she's not always had exactly the right thyroid hormone doses. She also appears to have had fluctuations in her thyroid receptor antibodies which have caused changes in her thyroid hormone levels.  Because of her lack of insurance, however, we had not been able to repeat her thyroid receptor antibody testing.  B. She has also had a problem with chronic iron deficiency anemia.   C. She has been obese/overweight. In June 2016 her HbA1c was 5.9%. In September 2016 her HbA1c was 5.7%. The four HbA1c tests performed in 2017 were normal, varying from 5.3-5.4%.  3. The patient's last [facility] visit was on 06/17/16.  A. In the interim, she has been healthy. She delivered her daughter the follow ing month.   B. After reviewing her lab results from December 2018, I changed her Synthroid dose to 1.5 of the 112 mcg tablets per day every day. Due to a misunderstanding, however, she  has been taking 1.5 of the 112 mcg Synthroid tablets per day for only 6 days per week. Her TFTs in June 2019 were normal.   C. She is not taking any other medications, vitamins, or minerals.   D. She has been eating healthier and walking a lot.  4. Pertinent Review of Systems:  Constitutional: The patient feels "good". Her energy level is good. Her body temperature is normal. She sleeps fairly well, but gets up in the middle of the  night with her daughter frequently.     Eyes: Vision is pretty good, but she sometimes needs her glasses for reading. There are no other significant eye complaints. She does not think that one eye is more prominent than the other. When she moves her eyes around she does not have any sense of pressure or restriction.  Neck: The patient has no complaints of anterior neck swelling, soreness, tenderness,  pressure, discomfort, or difficulty swallowing.  Heart: Heart rate increases with exercise or other physical activity. The patient has no complaints of palpitations, irregular heat beats, chest pain, or chest pressure. Gastrointestinal: As above. She is no longer constipated. She has no complaints of reflux, upset stomach, stomach aches, or diarrhea. Legs: Muscle mass and strength seem normal. There are no complaints of numbness, tingling, burning, or pain. She has not had swelling recently.   Feet: There are no obvious foot problems. There are no complaints of numbness, tingling, burning, or pain. No edema is noted. GYN: LMP was 02/06/18. Periods are regular.    PAST MEDICAL, FAMILY, AND SOCIAL HISTORY:  Past Medical History:  Diagnosis Date  . Exophthalmos   . Hypothyroidism, acquired, autoimmune   . Supervision of low-risk pregnancy 11/28/2015    Clinic  [facility] Prenatal Labs Dating  LMP Blood type: O/Positive/-- (05/11 1044)  Genetic Screen 1 Screen: negative   AFP:     Quad:     NIPS: Antibody:Negative (05/11 1044) Anatomic US  normal female fetus Rubella: 30.10 (05/11 1044) GTT Third trimester: normal RPR: Non Reactive (10/11 1443)  Flu vaccine  05/11/16 HBsAg: Negative (05/11 1044)  TDaP vaccine   05/11/16                              HIV: Non Reactive (10/11 1443)  Baby Food  breast                          GBS:  Contraception undecided Pap: Negative (08/29/2015) Circumcision N/a female  Pediatrician  [facility]  Support Person husband     . Thyroiditis, autoimmune     Family History   Problem Relation Age of Onset  . Cancer Maternal Aunt   . Thyroid disease Son   . Diabetes Mother   . Hypertension Father      Current Outpatient Medications:  .  levothyroxine (SYNTHROID) 112 MCG tablet, Take 1.5 tablets daily., Disp: 45 tablet, Rfl: 6 .  HYDROcodone-acetaminophen (NORCO/VICODIN) 5-325 MG tablet, Take 1-2 tablets by mouth every 4 (four) hours as needed. (Patient not taking: Reported on 02/24/2018), Disp: 6 tablet, Rfl: 0 .  ibuprofen (ADVIL,MOTRIN) 600 MG tablet, Take 600 mg by mouth every 6 (six) hours as needed., Disp: , Rfl:  .  Prenatal Vit-Fe Fumarate-FA (PRENATAL MULTIVITAMIN) TABS tablet, Take 1 tablet by mouth daily at 12 noon., Disp: , Rfl:   Allergies as of 02/24/2018  . (No Known Allergies)  1. 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 Her mother now lives in the [place] Saba has the [facility] Family Plan.  The Medication Assistance Program helps to pay for her Synthroid.  2. Activities: She stays busy with child care.  3. Smoking, alcohol, or drugs: None 4. Primary Care Provider: None.  5. OB: None  REVIEW OF SYSTEMS: There are no other significant problems involving the patient's other body systems.   Objective:  Vital Signs:  BP (!) 118/58   Pulse 72   Ht 5' 5.95" (1.675 m)   Wt 155 lb 6.4 oz (70.5 kg)   LMP 02/09/2018 (Exact Date)   BMI 25.12 kg/m   Wt Readings from Last 3 Encounters:  02/24/18 155 lb 6.4 oz (70.5 kg)  11/02/16 165 lb (74.8 kg)  09/02/16 165 lb (74.8 kg)   PHYSICAL EXAM: Constitutional: The patient appears healthy, slimmer, but still overweight . She has lost 22 pounds since her last visit. She is alert, oriented, and looks great. She has normal affect and insight today.  Eyes:  There is no obvious arcus. Her right eye is still a tiny bit more prominent than the left, but neither eye appears unusually prominent. She has no inferior proptosis today,  Extraocular movements of her eyes are  essentially normal, but upward and lateral movement of the right eye is a tiny  bit restricted. She feels no sense of eye muscle pressure when looking upward and left or looking upward and right. Moisture appears normal. Mouth: The oropharynx and tongue appear normal. Oral moisture is normal. There is no hyperpigmentation.  Neck: The neck appears to be visibly normal. No carotid bruits are noted. The thyroid gland has shrunk back to normal size.   The consistency of the thyroid gland is normal. The thyroid gland is not tender to palpation. Lungs: The lungs are clear to auscultation. Air movement is good. Heart: Heart rate and rhythm are regular. Heart sounds S1 and S2 are normal. I did not appreciate any pathologic cardiac murmurs or heart sounds. Abdomen: The abdomen is enlarged, but much smaller. Bowel sounds are normal. There is no obvious hepatomegaly, splenomegaly, or other mass effect.  Arms: Muscle size and bulk are normal for age. Hands: There is no obvious tremor. Phalangeal and metacarpophalangeal joints are normal. Palmar muscles are normal for age. Palmar skin is normal. Palmar moisture is also normal. There is no hyperpigmentation.  Legs: Muscles appear normal for age. No edema is present. Neurologic: Strength is normal for age in both the upper and lower extremities. Muscle tone is normal. Sensation to touch is normal in both legs.    LAB DATA:   Labs 01/06/18: TSH 0.83, free T4 1.4, free T3 3.1  Labs 1205/18: TSH 0.05, free T4 1.3, free T3 3.4  Labs 06/17/16: HbA1c 5.4%, CBG 103  Labs 06/15/16: TSH 0.26, free T4 1.2, free T3 2.3  Labs 05/07/16: TSH 0.47, free T4 1.1, free T3 2.5  8//28/17: TSH 3.54, free T4 1.2, free T3 2.2; HbA1c 5.4%  02/04/16: CBG 92 after breakfast, HbA1c 5.4%  02/03/16: TSH 5.85, free T4 1.0, free T3 1.8  12/04/15: TSH 3.52, free T4 1.1, free T3 2.2; iron 51; HbA1c 5.3%  11/28/15: TSH 2.330; CBC normal except for MCH of 26.7 (normal 27-33)  11/18/15:  Positive pregnancy test  04/15/15: HbA1c 5.7%; Hgb 11.7, Hct 35.7, iron 36 (normal 40-190); TSH 1.151, free T4 1.08, free T3 2.8  01/10/15: HbA1c 5.9%;  Hgb 11.9, Hct 36.3, iron 43; TSH 2.853, free T4 0.83, free T3 2.5  10/15/14: Hgb 11.3, Hct 35.6, MCH 25.6,Iron 24; TSH 2.549, free T4 0.99, free T3 2.4  04/13/14: Iron 24; Hgb 11.1, Hct 32.6%, MCV 74.9; TSH 0.122, free T4 1.45, free T3 3.3  08/04/12: TSH 0.824, free T4 1.26, free T3 3.4  02/01/13: Iron 84, Hgb 11.8, Hct 34.8%  01/30/13; TSH 1.397, free T4 1.44, free T3 2.8  08/02/12: TSH 3.078, free T4 1.14, free T3 2.7  12/15/11: TSH was 0.520, free T4 1.40, free T3  2.7   Assessment and Plan:   ASSESSMENT: 1. Hypothyroid/hyperthyroid:   A. During the past 13 years, Islam TFTs have varied over time with her pregnancies, other health conditions, and levothyroxine/Synthroid doses.    B. Her lab results in December 2018 were hyperthyroid, so I changed her Synthroid dose accordingly. Her TFTs in June 2019 were euthyroid.   C. Because Clarene does not have any health insurance, we will see her annually and check TSH levels semiannually. We will adjust her Synthroid doses to try to keep her TSH in the goal range of 1.0-2.0.  2. Thyroiditis: Her Hashimoto's disease is clinically quiescent.  3. Goiter: Her thyroid gland has shrunk back to normal size. The process of waxing and waning of thyroid gland size is c/w evolving thyroiditis. 4. Exophthalmos: The eyes are essentially within normal limits in terms of prominence again today, but she still has a very mild degree of extraocular movement restriction of her right eye.   5. Overweight: She has lost 22 pounds since her last visit.  She is at 20% above Ideal body Weight. She needs to continue to Eat Right and to walk an hour a day or equivalent exercise.  6. Fatigue/coldness/iron deficiency anemia: Resolved 7. Hypotension: Her BP is good. Her dizziness has resolved.  8. Pre-diabetes: Her HbA1c was in  the prediabetic range in June and September 2016, was 5.3% at her June 2017 visit, but had decreased to 5.3% in May 2017, then increased to 5.4% at her visits in July, August, and November 2017.  PLAN: 1. Diagnostic: Repeat her TSH in 6 months and 12 months.   2. Therapeutic: Continue her Synthroid doses of 1.5 of the 112 mcg pills per day on 8 days each week, but skip Sundays. Take MVI about dinner time.  3. Patient education: We discussed the fact that we will try to manage her thyroid issues as much as possible by phone and with minimum labs.  4. Follow-up: 12 months  Level of Service: This visit lasted in excess of 45 minutes. More than 50% of the visit was devoted to counseling.  Molli Knock, MD, CDE Adult and Pediatric Endocrinology

## 2018-08-11 ENCOUNTER — Ambulatory Visit: Payer: Self-pay | Attending: Family Medicine | Admitting: *Deleted

## 2018-08-11 VITALS — Temp 98.2°F

## 2018-08-11 DIAGNOSIS — Z23 Encounter for immunization: Secondary | ICD-10-CM

## 2018-08-23 LAB — TSH: TSH: 8.83 mIU/L — ABNORMAL HIGH

## 2018-08-25 ENCOUNTER — Ambulatory Visit (INDEPENDENT_AMBULATORY_CARE_PROVIDER_SITE_OTHER): Payer: Self-pay | Admitting: "Endocrinology

## 2018-08-29 ENCOUNTER — Encounter (INDEPENDENT_AMBULATORY_CARE_PROVIDER_SITE_OTHER): Payer: Self-pay

## 2018-09-13 ENCOUNTER — Ambulatory Visit: Payer: Self-pay | Admitting: Family Medicine

## 2018-09-26 ENCOUNTER — Encounter (INDEPENDENT_AMBULATORY_CARE_PROVIDER_SITE_OTHER): Payer: Self-pay | Admitting: "Endocrinology

## 2018-09-26 ENCOUNTER — Ambulatory Visit (INDEPENDENT_AMBULATORY_CARE_PROVIDER_SITE_OTHER): Payer: BLUE CROSS/BLUE SHIELD | Admitting: "Endocrinology

## 2018-09-26 VITALS — BP 118/76 | HR 90 | Ht 65.75 in | Wt 161.4 lb

## 2018-09-26 DIAGNOSIS — E049 Nontoxic goiter, unspecified: Secondary | ICD-10-CM | POA: Diagnosis not present

## 2018-09-26 DIAGNOSIS — E349 Endocrine disorder, unspecified: Secondary | ICD-10-CM | POA: Diagnosis not present

## 2018-09-26 DIAGNOSIS — E063 Autoimmune thyroiditis: Secondary | ICD-10-CM | POA: Diagnosis not present

## 2018-09-26 DIAGNOSIS — R231 Pallor: Secondary | ICD-10-CM

## 2018-09-26 DIAGNOSIS — E663 Overweight: Secondary | ICD-10-CM | POA: Diagnosis not present

## 2018-09-26 DIAGNOSIS — D508 Other iron deficiency anemias: Secondary | ICD-10-CM

## 2018-09-26 DIAGNOSIS — R5383 Other fatigue: Secondary | ICD-10-CM

## 2018-09-26 DIAGNOSIS — R7303 Prediabetes: Secondary | ICD-10-CM

## 2018-09-26 NOTE — Progress Notes (Signed)
Subjective:  Patient Name: Grace Wall Wall Date of Birth: [birth year]  MRN: [id number]  Grace Wall Wall  presents to the office today for follow-up of her diffuse toxic goiter Luiz Blare(Graves' disease), hypothyroidism, Hashimoto's disease, exophthalmos, and obesity.  HISTORY OF PRESENT ILLNESS:   Grace Wall is a 33 y.o. [place] young woman. Grace Wall was accompanied by her daughter.   1. The patient was first referred to me by herself on 05/08/05.   A. She was diagnosed with thyroid problems at age 89. At that point she was hyperthyroid, had a big anterior neck, and had big eyes, so she presumably had Graves' disease. She was placed on Tapazole (methimazole). Tapazole was subsequently stopped at about age 33.  B. About one year later, she became hypothyroid, presumably due to coexistent Hashimoto's disease. She started Synthroid. The Synthroid was changed to generic levothyroxine in 2005. The patient's eyes had gradually improved over time. At that first visit with me, she was feeling fairly well, but was tired a lot. She was also somewhat mentally confused at times.   C. On physical examination she had intermittent proptosis bilaterally. The thyroid gland was 20+ grams in size. The right lobe was within normal limits, but the left lobe was slightly enlarged. Laboratory data showed a TSH of 3.09, free T4 1.22, and free T3 of 2.6. Her TPO antibody level was 1171.1, with normals being less than 60. A thyroid-stimulating immunoglobulin level was 64, with normals being <129. The thyroid binding inhibitory immunoglobulin was 96, with normals being less than 17. Her TSH receptor antibody was 87, with normals being less than 10. 2. Given the high levels of the thyroid receptor antibody, it was unclear how her thyroid tests would evolve. Repeat thyroid hormone tests performed on 05/18/2005 were markedly different. At this time the TSH was 0.032,  free T4 was 1.77, and free T3 was 3.9. At that point it was still unclear whether  the fluctuations in thyroid hormone were due to changes in the forms of levothyroxine made by different companies, or to  changes in thyroid receptor antibody levels, or both. I changed her to brand Synthroid at a dose of 150 mcg per day.   2. During the past 13 years we have followed her for several problems:  A. We've had to adjust her thyroid hormone doses frequently. Her TSH values have ranged from 0.04 - 8.58. Part of these fluctuations have been due to flare ups of Hashimoto's disease. In addition, because she has not had any health insurance during most of this time, she's often had to rely on thyroid hormone samples. Sometimes she's not always had exactly the right thyroid hormone doses. She also appears to have had fluctuations in her thyroid receptor antibodies which have caused changes in her thyroid hormone levels.  Because of her lack of insurance, however, we had not been able to repeat her thyroid receptor antibody testing.  B. She has also had a problem with chronic iron deficiency anemia.   C. She has been obese/overweight. In June 2016 her HbA1c was 5.9%. In September 2016 her HbA1c was 5.7%. The four HbA1c tests performed in 2017 were normal, varying from 5.3-5.4%. Due to her lack of insurance, we have not been able to perform this testing for the past two years.  3. The patient's last [facility] visit was on 02/24/18. At that visit I continued her Synthroid dose of 1.5 of the 112 mcg tablets daily for 6 days each week, but skipped doses on Sundays.  However,  after reviewing her TSH result from 08/23/18, I increase her Synthroid to 1.5 of the 112 mcg tablets every day.   A. In the interim, she has been healthy. Because her daughter is sleeping through the night more often, Grace Wall is getting more sleep, but still often not enough. She has also been "down" emotionally more frequently recently   B. She is not usually taking any other medications, vitamins, or minerals in the mornings when she takes  her Synthroid. .   C. She has been eating healthier, but has not been walking a lot.  D. She has been having episodes of trapezius and nuchal tightness and soreness, posterior headaches, and band-like headaches. Sometimes when she had these issues she also notes tingling numbness in both hands.   E. She has her MVIs for Women at home, but just can't remember to take them.                        4. Pertinent Review of Systems:  Constitutional: The patient feels "tired". Her energy level is less. Her body temperature is normal.  Eyes: Vision is pretty good, but she sometimes needs her glasses for distance reading. There are no other significant eye complaints. She does not think that one eye is more prominent than the other. When she moves her eyes around she does not have any sense of pressure or restriction.  Neck: The patient has no complaints of anterior neck swelling, soreness, tenderness,  pressure, discomfort, or difficulty swallowing.  Heart: Heart rate increases with exercise or other physical activity. The patient has no complaints of palpitations, irregular heat beats, chest pain, or chest pressure. Gastrointestinal: As above. She is no longer constipated. She has no complaints of reflux, upset stomach, stomach aches, or diarrhea. Legs: Muscle mass and strength seem normal. There are no complaints of numbness, tingling, burning, or pain. She has not had swelling recently.   Feet: There are no obvious foot problems. There are no complaints of numbness, tingling, burning, or pain. No edema is noted. GYN: LMP was 09/03/18. Periods are regular.    PAST MEDICAL, FAMILY, AND SOCIAL HISTORY:  Past Medical History:  Diagnosis Date  . Exophthalmos   . Hypothyroidism, acquired, autoimmune   . Supervision of low-risk pregnancy 11/28/2015    Clinic  [facility] Prenatal Labs Dating  LMP Blood type: O/Positive/-- (05/11 1044)  Genetic Screen 1 Screen: negative   AFP:     Quad:      NIPS: Antibody:Negative (05/11 1044) Anatomic [place]  normal female fetus Rubella: 30.10 (05/11 1044) GTT Third trimester: normal RPR: Non Reactive (10/11 1443)  Flu vaccine  05/11/16 HBsAg: Negative (05/11 1044)  TDaP vaccine   05/11/16                              HIV: Non Reactive (10/11 1443)  Baby Food  breast                          GBS:  Contraception undecided Pap: Negative (08/29/2015) Circumcision N/a female  Pediatrician  [facility]  Support Person husband     . Thyroiditis, autoimmune     Family History  Problem Relation Age of Onset  . Cancer Maternal Aunt   . Thyroid disease Son   . Diabetes Mother   . Hypertension Father      Current Outpatient Medications:  .  SYNTHROID 112 MCG tablet, Take 1.5 brand Synthroid 112 mcg tablets daily., Disp: 135 tablet, Rfl: 3 .  ibuprofen (ADVIL,MOTRIN) 600 MG tablet, Take 600 mg by mouth every 6 (six) hours as needed., Disp: , Rfl:  .  Prenatal Vit-Fe Fumarate-FA (PRENATAL MULTIVITAMIN) TABS tablet, Take 1 tablet by mouth daily at 12 noon., Disp: , Rfl:   Allergies as of 09/26/2018  . (No Known Allergies)    1. 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 She now has health insurance through her husband's job. 2. Activities: She stays busy with child care.  3. Smoking, alcohol, or drugs: None 4. Primary Care Provider: None.  5. OB: None  REVIEW OF SYSTEMS: There are no other significant problems involving the patient's other body systems.   Objective:  Vital Signs:  BP 118/76   Pulse 90   Ht 5' 5.75" (1.67 m)   Wt 161 lb 6.4 oz (73.2 kg)   BMI 26.25 kg/m   Wt Readings from Last 3 Encounters:  09/26/18 161 lb 6.4 oz (73.2 kg)  02/24/18 155 lb 6.4 oz (70.5 kg)  11/02/16 165 lb (74.8 kg)   PHYSICAL EXAM: Constitutional: The patient appears healthy, but more overweight. She has re-gained 6 pounds since her last visit. She is alert, oriented, and looks good. She has normal affect and insight  today.  Eyes:  There is no obvious arcus. Her right eye is still a tiny bit more prominent than the left, but neither eye appears unusually prominent. She has no inferior proptosis today,  Extraocular movements of her eyes are essentially normal, but upward and lateral movement of the right eye is a tiny bit restricted. She feels no sense of eye muscle pressure when looking upward and left or looking upward and right. Moisture appears normal. Mouth: The oropharynx and tongue appear normal. Oral moisture is normal. There is no hyperpigmentation.  Neck: The neck appears to be visibly normal. No carotid bruits are noted. The thyroid gland has shrunk back to normal size. The consistency of the thyroid gland is normal. The thyroid gland is not tender to palpation. Trapezius muscles and nuchal cords are somewhat tight today.  Lungs: The lungs are clear to auscultation. Air movement is good. Heart: Heart rate and rhythm are regular. Heart sounds S1 and S2 are normal. I did not appreciate any pathologic cardiac murmurs or heart sounds. Abdomen: The abdomen is enlarged, but much smaller. Bowel sounds are normal. There is no obvious hepatomegaly, splenomegaly, or other mass effect.  Arms: Muscle size and bulk are normal for age. Her nails are somewhat pallid today.  Hands: There is no obvious tremor. Phalangeal and metacarpophalangeal joints are normal. Palmar muscles are normal for age. Palmar skin is normal. Palmar moisture is also normal. There is no hyperpigmentation.  Legs: Muscles appear normal for age. No edema is present. Neurologic: Strength is normal for age in both the upper and lower extremities. Muscle tone is normal. Sensation to touch is normal in both legs.    LAB DATA:   Labs 08/23/18: TSH 8.83  Labs 01/06/18: TSH 0.83, free T4 1.4, free T3 3.1  Labs 1205/18: TSH 0.05, free T4 1.3, free T3 3.4  Labs 06/17/16: HbA1c 5.4%, CBG 103  Labs 06/15/16: TSH 0.26, free T4 1.2, free T3 2.3  Labs  05/07/16: TSH 0.47, free T4 1.1, free T3 2.5  8//28/17: TSH 3.54, free T4 1.2, free T3 2.2; HbA1c 5.4%  02/04/16: CBG 92  after breakfast, HbA1c 5.4%  02/03/16: TSH 5.85, free T4 1.0, free T3 1.8  12/04/15: TSH 3.52, free T4 1.1, free T3 2.2; iron 51; HbA1c 5.3%  11/28/15: TSH 2.330; CBC normal except for [facility] of 26.7 (normal 27-33)  11/18/15: Positive pregnancy test  04/15/15: HbA1c 5.7%; Hgb 11.7, Hct 35.7, iron 36 (normal 40-190); TSH 1.151, free T4 1.08, free T3 2.8  01/10/15: HbA1c 5.9%; Hgb 11.9, Hct 36.3, iron 43; TSH 2.853, free T4 0.83, free T3 2.5  10/15/14: Hgb 11.3, Hct 35.6, MCH 25.6,Iron 24; TSH 2.549, free T4 0.99, free T3 2.4  04/13/14: Iron 24; Hgb 11.1, Hct 32.6%, MCV 74.9; TSH 0.122, free T4 1.45, free T3 3.3  08/04/12: TSH 0.824, free T4 1.26, free T3 3.4  02/01/13: Iron 84, Hgb 11.8, Hct 34.8%  01/30/13; TSH 1.397, free T4 1.44, free T3 2.8  08/02/12: TSH 3.078, free T4 1.14, free T3 2.7  12/15/11: TSH was 0.520, free T4 1.40, free T3  2.7   Assessment and Plan:   ASSESSMENT: 1. Hypothyroid/hyperthyroid:   A. During the past 13 years, Grace Wall TFTs have varied over time with her pregnancies, other health conditions, and levothyroxine/Synthroid doses.    B. Her lab results in December 2018 were hyperthyroid, so I changed her Synthroid dose accordingly. Her TFTs in June 2019 were euthyroid.   C. Unfortunately, her TSH in February 2020 was much more elevated. I increased her Synthroid dose accordingly. We will repeat her TFTs in early April. We will adjust her Synthroid doses to try to keep her TSH in the goal range of 1.0-2.0.  2. Thyroiditis: Her Hashimoto's disease is clinically quiescent today.  3. Goiter: Her thyroid gland has shrunk back to normal size. The process of waxing and waning of thyroid gland size is c/w evolving thyroiditis. 4. Exophthalmos: The eyes are essentially within normal limits in terms of prominence again today, but she still has a very mild  degree of extraocular movement restriction of her right eye.   5. Overweight: She has re-gained 6 pounds since her last visit.  She is about 25% above Ideal Body Weight. She needs to continue to Eat Right and to walk an hour a day or perform equivalent exercise.  6-9. Fatigue/pallor/coldness/iron deficiency anemia: She has more nail pallor today. She is also fatigued now, in part due to being hypothyroid, but probably in part due to iron deficiency anemia, since she has not been taking her MVI for Women..  10. Hypotension: Her BP is good. Her dizziness has resolved.  11. Pre-diabetes: Her HbA1c was in the prediabetic range in June and September 2016, was 5.3% at her June 2017 visit, but had decreased to 5.3% in May 2017, then increased to 5.4% at her visits in July, August, and November 2017.  PLAN: 1. Diagnostic: Repeat her TFTs, CBC, iron, and HbA1c about April 10th. 2. Therapeutic: Continue her Synthroid doses of 1.5 of the 112 mcg pills per day every day. Take MVI about dinner time.  3. Patient education: We discussed her lab results and the need to follow her closely.  4. Follow-up: 6 months  Level of Service: This visit lasted in excess of 45 minutes. More than 50% of the visit was devoted to counseling.  Molli Knock, MD, CDE Adult and Pediatric Endocrinology

## 2018-09-26 NOTE — Patient Instructions (Signed)
Follow up visit in 6 months. Please repeat lab tests about April 10th.

## 2019-02-02 ENCOUNTER — Encounter (INDEPENDENT_AMBULATORY_CARE_PROVIDER_SITE_OTHER): Payer: Self-pay | Admitting: "Endocrinology

## 2019-03-14 ENCOUNTER — Other Ambulatory Visit (INDEPENDENT_AMBULATORY_CARE_PROVIDER_SITE_OTHER): Payer: Self-pay | Admitting: "Endocrinology

## 2019-03-14 DIAGNOSIS — E063 Autoimmune thyroiditis: Secondary | ICD-10-CM

## 2019-03-31 ENCOUNTER — Ambulatory Visit (INDEPENDENT_AMBULATORY_CARE_PROVIDER_SITE_OTHER): Payer: BLUE CROSS/BLUE SHIELD | Admitting: "Endocrinology

## 2019-04-10 DIAGNOSIS — E063 Autoimmune thyroiditis: Secondary | ICD-10-CM | POA: Diagnosis not present

## 2019-04-10 DIAGNOSIS — D508 Other iron deficiency anemias: Secondary | ICD-10-CM | POA: Diagnosis not present

## 2019-04-10 DIAGNOSIS — R7303 Prediabetes: Secondary | ICD-10-CM | POA: Diagnosis not present

## 2019-04-11 ENCOUNTER — Telehealth (INDEPENDENT_AMBULATORY_CARE_PROVIDER_SITE_OTHER): Payer: Self-pay | Admitting: "Endocrinology

## 2019-04-11 DIAGNOSIS — D508 Other iron deficiency anemias: Secondary | ICD-10-CM

## 2019-04-11 DIAGNOSIS — R231 Pallor: Secondary | ICD-10-CM

## 2019-04-11 LAB — CBC WITH DIFFERENTIAL/PLATELET
Absolute Monocytes: 330 cells/uL (ref 200–950)
Basophils Absolute: 9 cells/uL (ref 0–200)
Basophils Relative: 0.2 %
Eosinophils Absolute: 48 cells/uL (ref 15–500)
Eosinophils Relative: 1.1 %
HCT: 29.8 % — ABNORMAL LOW (ref 35.0–45.0)
Hemoglobin: 9.5 g/dL — ABNORMAL LOW (ref 11.7–15.5)
Lymphs Abs: 1588 cells/uL (ref 850–3900)
MCH: 23.6 pg — ABNORMAL LOW (ref 27.0–33.0)
MCHC: 31.9 g/dL — ABNORMAL LOW (ref 32.0–36.0)
MCV: 74.1 fL — ABNORMAL LOW (ref 80.0–100.0)
MPV: 11.1 fL (ref 7.5–12.5)
Monocytes Relative: 7.5 %
Neutro Abs: 2424 cells/uL (ref 1500–7800)
Neutrophils Relative %: 55.1 %
Platelets: 177 10*3/uL (ref 140–400)
RBC: 4.02 10*6/uL (ref 3.80–5.10)
RDW: 14.5 % (ref 11.0–15.0)
Total Lymphocyte: 36.1 %
WBC: 4.4 10*3/uL (ref 3.8–10.8)

## 2019-04-11 LAB — HEMOGLOBIN A1C
Hgb A1c MFr Bld: 5.6 % of total Hgb (ref <5.7)
Mean Plasma Glucose: 114 (calc)
eAG (mmol/L): 6.3 (calc)

## 2019-04-11 LAB — TSH: TSH: 0.51 mIU/L

## 2019-04-11 LAB — T3, FREE: T3, Free: 3.2 pg/mL (ref 2.3–4.2)

## 2019-04-11 LAB — IRON: Iron: 24 ug/dL — ABNORMAL LOW (ref 40–190)

## 2019-04-11 LAB — T4, FREE: Free T4: 1.3 ng/dL (ref 0.8–1.8)

## 2019-04-11 NOTE — Telephone Encounter (Signed)
Routed to provider

## 2019-04-11 NOTE — Telephone Encounter (Signed)
Who's calling (name and relationship to patient) : Grace Wall   Best contact number: [phone]  Provider they see: Dr. Tobe Sos  Reason for call:  [name] called in to get her lab results please advise  Call ID:      [place]  Name of prescription:  Pharmacy:

## 2019-04-11 NOTE — Telephone Encounter (Addendum)
Left message that RN will send the results through her MyChart and to call back tomorrow or send message for questions----- Message from Sherrlyn Hock, MD sent at 04/11/2019  4:43 PM EDT ----- Thyroid tests were normal, at about the 95% of the physiologic range.  CBC was abnormal, with the hemoglobin, hematocrit, MCV, MCH, and MCHC all being low, c/w iron deficiency anemia.  Iron was low at 24. It is time to resume iron treatment. Please take the iron about 6-12 hours apart from when you take your thyroid hormone.   Clinical staff: Please order a repeat CBC and iron in 4 weeks. Thanks.  Dr.Brennan   Labs ordered mychart message sent with above information

## 2019-04-12 ENCOUNTER — Ambulatory Visit (INDEPENDENT_AMBULATORY_CARE_PROVIDER_SITE_OTHER): Payer: BLUE CROSS/BLUE SHIELD | Admitting: "Endocrinology

## 2019-04-20 ENCOUNTER — Other Ambulatory Visit: Payer: Self-pay

## 2019-04-20 ENCOUNTER — Ambulatory Visit (INDEPENDENT_AMBULATORY_CARE_PROVIDER_SITE_OTHER): Payer: Medicaid Other | Admitting: "Endocrinology

## 2019-04-20 MED ORDER — FERROUS SULFATE 325 (65 FE) MG PO TABS: ORAL_TABLET | ORAL | 3 refills | Status: AC

## 2019-05-25 ENCOUNTER — Ambulatory Visit (INDEPENDENT_AMBULATORY_CARE_PROVIDER_SITE_OTHER): Payer: Medicaid Other | Admitting: "Endocrinology

## 2019-06-08 ENCOUNTER — Ambulatory Visit (INDEPENDENT_AMBULATORY_CARE_PROVIDER_SITE_OTHER): Payer: Medicaid Other | Admitting: "Endocrinology

## 2019-08-07 DIAGNOSIS — R231 Pallor: Secondary | ICD-10-CM | POA: Diagnosis not present

## 2019-08-07 DIAGNOSIS — D508 Other iron deficiency anemias: Secondary | ICD-10-CM | POA: Diagnosis not present

## 2019-08-07 LAB — CBC WITH DIFFERENTIAL/PLATELET
Absolute Monocytes: 231 cells/uL (ref 200–950)
Basophils Absolute: 21 cells/uL (ref 0–200)
Basophils Relative: 0.5 %
Eosinophils Absolute: 50 cells/uL (ref 15–500)
Eosinophils Relative: 1.2 %
HCT: 39 % (ref 35.0–45.0)
Hemoglobin: 13.1 g/dL (ref 11.7–15.5)
Lymphs Abs: 1315 cells/uL (ref 850–3900)
MCH: 29.5 pg (ref 27.0–33.0)
MCHC: 33.6 g/dL (ref 32.0–36.0)
MCV: 87.8 fL (ref 80.0–100.0)
MPV: 12.1 fL (ref 7.5–12.5)
Monocytes Relative: 5.5 %
Neutro Abs: 2583 cells/uL (ref 1500–7800)
Neutrophils Relative %: 61.5 %
Platelets: 153 10*3/uL (ref 140–400)
RBC: 4.44 10*6/uL (ref 3.80–5.10)
RDW: 13.6 % (ref 11.0–15.0)
Total Lymphocyte: 31.3 %
WBC: 4.2 10*3/uL (ref 3.8–10.8)

## 2019-08-07 LAB — IRON: Iron: 67 ug/dL (ref 40–190)

## 2019-08-07 NOTE — Progress Notes (Signed)
Subjective:  Patient Name: Grace Wall Date of Birth: 11/15/1985  MRN: [id number]  Grace Wall  presents to the office today for follow-up of her diffuse toxic goiter Grace Wall' disease), hypothyroidism, Hashimoto's disease, exophthalmos, iron deficiency anemia, and obesity.  HISTORY OF PRESENT ILLNESS:   Grace Wall is a 34 y.o. [place] young woman. Grace Wall was accompanied by her daughter.   1. The patient was first referred to me by herself on 05/08/05.   A. She was diagnosed with thyroid problems at age 84. At that point she was hyperthyroid, had a big anterior neck, and had big eyes, so she presumably had Graves' disease. She was placed on Tapazole (methimazole). Tapazole was subsequently stopped at about age 81.  B. About one year later, she became hypothyroid, presumably due to coexistent Hashimoto's disease. She started Synthroid. The Synthroid was changed to generic levothyroxine in 2005. The patient's eyes had gradually improved over time. At that first visit with me, she was feeling fairly well, but was tired a lot. She was also somewhat mentally confused at times.   C. On physical examination she had intermittent proptosis bilaterally. The thyroid gland was 20+ grams in size. The right lobe was within normal limits, but the left lobe was slightly enlarged. Laboratory data showed a TSH of 3.09, free T4 1.22, and free T3 of 2.6. Her TPO antibody level was 1171.1, with normals being less than 60. A thyroid-stimulating immunoglobulin level was 64, with normals being <129. The thyroid binding inhibitory immunoglobulin was 96, with normals being less than 17. Her TSH receptor antibody was 87, with normals being less than 10. 2. Given the high levels of the thyroid receptor antibody, it was unclear how her thyroid tests would evolve. Repeat thyroid hormone tests performed on 05/18/2005 were markedly different. At this time the TSH was 0.032,  free T4 was 1.77, and free T3 was 3.9. At that point it was  still unclear whether the fluctuations in thyroid hormone were due to changes in the forms of levothyroxine made by different companies, or to  changes in thyroid receptor antibody levels, or both. I changed her to brand Synthroid at a dose of 150 mcg per day.   2. During the past 14 years we have followed her for several problems:  A. We've had to adjust her thyroid hormone doses frequently. Her TSH values have ranged from 0.04 - 8.58. Part of these fluctuations have been due to flare ups of Hashimoto's disease. In addition, because she has not had any health insurance during most of this time, she's often had to rely on thyroid hormone samples. Sometimes she's not always had exactly the right thyroid hormone doses. She also appears to have had fluctuations in her thyroid receptor antibodies which have caused changes in her thyroid hormone levels.  Because of her lack of insurance, however, we had not been able to repeat her thyroid receptor antibody testing.  B. She has also had a problem with chronic iron deficiency anemia.   C. She has been obese/overweight. In June 2016 her HbA1c was 5.9%. In September 2016 her HbA1c was 5.7%. The four HbA1c tests performed in 2017 were normal, varying from 5.3-5.4%. Due to her lack of insurance, we have not been able to perform this testing for the past two years.  3. The patient's last [facility] visit was on 09/26/18. At that visit I continued her Synthroid dose of 1.5 of the 112 mcg tablets daily. After reviewing her lab results in September, I asked  her to resume taking ferrous sulfate, 325 mg, every evening.   A. In the interim, she has been healthy. She is sleeping better. Her emotions have been up and down. Her mother-in-law died recently.   B. She is not usually taking any other medications, vitamins, or minerals in the mornings when she takes her Synthroid. .   C. She has been eating healthier. She resumed exercising after the holidays.  D. She has not been having  much trapezius and nuchal tightness and soreness, posterior headaches, and band-like headaches. The tingling numbness in both hands has resolved.   E. She has been taking her iron, but not her MVIs for Women. She is wiling to resume taking the MVI. She was also advised bu someone else to take extra vitamin D and B12.                         4. Pertinent Review of Systems:  Constitutional: The patient still feels "tired". Her energy level is "still kind weak sometimes". Her body temperature is normal.  Eyes: Vision is pretty good, but she sometimes needs her glasses for distance reading. There are no other significant eye complaints. She does not think that one eye is more prominent than the other. When she moves her eyes around she does not have any sense of pressure or restriction.  Neck: The patient has no complaints of anterior neck swelling, soreness, tenderness,  pressure, discomfort, or difficulty swallowing.  Heart: Heart rate increases with exercise or other physical activity. The patient has no complaints of palpitations, irregular heat beats, chest pain, or chest pressure. Gastrointestinal: She is no longer constipated. She has no complaints of reflux, upset stomach, stomach aches, or diarrhea. Legs: Muscle mass and strength seem normal. There are no complaints of numbness, tingling, burning, or pain. She has not had swelling recently.   Feet: There are no obvious foot problems. There are no complaints of numbness, tingling, burning, or pain. No edema is noted. GYN: LMP was 07/22/19. Periods occur regularly.    PAST MEDICAL, FAMILY, AND SOCIAL HISTORY:  Past Medical History:  Diagnosis Date  . Exophthalmos   . Hypothyroidism, acquired, autoimmune   . Supervision of low-risk pregnancy 11/28/2015    Clinic  [facility] Prenatal Labs Dating  LMP Blood type: O/Positive/-- (05/11 1044)  Genetic Screen 1 Screen: negative   AFP:     Quad:     NIPS: Antibody:Negative (05/11 1044) Anatomic [place]  normal  female fetus Rubella: 30.10 (05/11 1044) GTT Third trimester: normal RPR: Non Reactive (10/11 1443)  Flu vaccine  05/11/16 HBsAg: Negative (05/11 1044)  TDaP vaccine   05/11/16                              HIV: Non Reactive (10/11 1443)  Baby Food  breast                          GBS:  Contraception undecided Pap: Negative (08/29/2015) Circumcision N/a female  Pediatrician  [facility]  Support Person husband     . Thyroiditis, autoimmune     Family History  Problem Relation Age of Onset  . Cancer Maternal Aunt   . Thyroid disease Son   . Diabetes Mother   . Hypertension Father      Current Outpatient Medications:  .  ferrous sulfate (FERROUSUL) 325 (65 FE)  MG tablet, Take 1 pill daily, Disp: 90 tablet, Rfl: 3 .  ibuprofen (ADVIL,MOTRIN) 600 MG tablet, Take 600 mg by mouth every 6 (six) hours as needed., Disp: , Rfl:  .  SYNTHROID 112 MCG tablet, TAKE 1 & 1/2 TABLETS BY MOUTH DAILY., Disp: 135 tablet, Rfl: 1 .  Prenatal Vit-Fe Fumarate-FA (PRENATAL MULTIVITAMIN) TABS tablet, Take 1 tablet by mouth daily at 12 noon., Disp: , Rfl:   Allergies as of 08/08/2019  . (No Known Allergies)    1. 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 She now has health insurance through her husband's job. 2. Activities: She stays busy with child care.  3. Smoking, alcohol, or drugs: None 4. Primary Care Provider: She will soon see a provider in [facility]  5. OB: None  REVIEW OF SYSTEMS: There are no other significant problems involving the patient's other body systems.   Objective:  Vital Signs:  BP 116/68   Pulse 88   Wt 161 lb 9.6 oz (73.3 kg)   BMI 26.28 kg/m   Wt Readings from Last 3 Encounters:  08/08/19 161 lb 9.6 oz (73.3 kg)  09/26/18 161 lb 6.4 oz (73.2 kg)  02/24/18 155 lb 6.4 oz (70.5 kg)   PHYSICAL EXAM: Constitutional: The patient appears healthy, but overweight and a bit tired. Her weight has been stable since her last visit. . She  is alert, oriented, and looks pretty good. She has normal affect and insight today.  Eyes:  There is no obvious arcus. Her right eye is still a tiny bit more prominent than the left, but neither eye appears unusually prominent. She has no inferior proptosis today,  Extraocular movements of her eyes are essentially normal, but upward and lateral movement of the right eye is a tiny bit restricted. She feels no sense of eye muscle pressure when looking upward and left or looking upward and right. Moisture appears normal. Mouth: The oropharynx and tongue appear normal. Oral moisture is normal. There is no hyperpigmentation.  Neck: The neck appears to be visibly normal. No carotid bruits are noted. The thyroid gland has shrunk back to normal size. The consistency of the thyroid gland is normal. The thyroid gland is not tender to palpation. Trapezius muscles and nuchal cords are somewhat tight today.  Lungs: The lungs are clear to auscultation. Air movement is good. Heart: Heart rate and rhythm are regular. Heart sounds S1 and S2 are normal. I did not appreciate any pathologic cardiac murmurs or heart sounds. Abdomen: The abdomen is enlarged. Bowel sounds are normal. There is no obvious hepatomegaly, splenomegaly, or other mass effect.  Arms: Muscle size and bulk are normal for age.  Hands: There is no obvious tremor. Phalangeal and metacarpophalangeal joints are normal. Palmar muscles are normal for age. Palmar skin is normal. Palmar moisture is also normal. There is no hyperpigmentation. Her nails are no longer pallid today.  Legs: Muscles appear normal for age. No edema is present. Neurologic: Strength is normal for age in both the upper and lower extremities. Muscle tone is normal. Sensation to touch is normal in both legs.    LAB DATA:   Labs 08/07/19: CBC normal; iron 67 (ref 40-190)  Labs 04/10/19: HbA1c 5.6%; TSH 0.51, free T4 1.3, free T3 3.2; CBC normal, except Hgb 9.5 (ref 11.7-15.5), Hct 29.8  (ref 35-45), MCV 74.1 (ref 80-100), MCH 23.6 (ref 27-33), MCHC 31.9 (ref 32-36); iron 24 (ref 40-190)  Labs 08/23/18: TSH  8.83  Labs 01/06/18: TSH 0.83, free T4 1.4, free T3 3.1  Labs 1205/18: TSH 0.05, free T4 1.3, free T3 3.4  Labs 06/17/16: HbA1c 5.4%, CBG 103  Labs 06/15/16: TSH 0.26, free T4 1.2, free T3 2.3  Labs 05/07/16: TSH 0.47, free T4 1.1, free T3 2.5  8//28/17: TSH 3.54, free T4 1.2, free T3 2.2; HbA1c 5.4%  02/04/16: CBG 92 after breakfast, HbA1c 5.4%  02/03/16: TSH 5.85, free T4 1.0, free T3 1.8  12/04/15: TSH 3.52, free T4 1.1, free T3 2.2; iron 51; HbA1c 5.3%  11/28/15: TSH 2.330; CBC normal except for MCH of 26.7 (normal 27-33)  11/18/15: Positive pregnancy test  04/15/15: HbA1c 5.7%; Hgb 11.7, Hct 35.7, iron 36 (normal 40-190); TSH 1.151, free T4 1.08, free T3 2.8  01/10/15: HbA1c 5.9%; Hgb 11.9, Hct 36.3, iron 43; TSH 2.853, free T4 0.83, free T3 2.5  10/15/14: Hgb 11.3, Hct 35.6, MCH 25.6,Iron 24; TSH 2.549, free T4 0.99, free T3 2.4  04/13/14: Iron 24; Hgb 11.1, Hct 32.6%, MCV 74.9; TSH 0.122, free T4 1.45, free T3 3.3  08/04/12: TSH 0.824, free T4 1.26, free T3 3.4  02/01/13: Iron 84, Hgb 11.8, Hct 34.8%  01/30/13; TSH 1.397, free T4 1.44, free T3 2.8  08/02/12: TSH 3.078, free T4 1.14, free T3 2.7  12/15/11: TSH was 0.520, free T4 1.40, free T3  2.7   Assessment and Plan:   ASSESSMENT: 1. Hypothyroid/hyperthyroid:   A. During the past 14 years, Anniemae TFTs have varied over time with her pregnancies, other health conditions, and levothyroxine/Synthroid doses.    B. Her TFTs in September were at the upper end of the reference range. We will repeat her TFTs in April. We will adjust her Synthroid doses to try to keep her TSH in the goal range of 1.0-2.0.  2. Thyroiditis: Her Hashimoto's disease is clinically quiescent today.  3. Goiter: Her thyroid gland has shrunk back to normal size. The process of waxing and waning of thyroid gland size is c/w evolving  thyroiditis. 4. Exophthalmos: The eyes are essentially within normal limits in terms of prominence again today, but she still has a very mild degree of extraocular movement restriction of her right eye.   5. Overweight: Her weight has ben stable.  She is about 25% above Ideal Body Weight. She needs to continue to Eat Right and to walk an hour a day or perform equivalent exercise.  6-9. Fatigue/pallor/coldness/iron deficiency anemia:   A. Her pallor and iron deficiency anemia have resolved after resuming iron treatment. .   B. She is still tired, but looks better.  10. Hypotension: Her BP is good. Her dizziness has resolved.  11. Pre-diabetes: Her HbA1c was in the prediabetic range in June and September 2016, was 5.3% at her June 2017 visit, but had decreased to 5.3% in May 2017, then increased to 5.4% at her visits in July, August, and November 2017. He HbA1c in September 2020 had increased to 5.6%.   PLAN: 1. Diagnostic: Repeat her TFTs, CBC, iron, and HbA1c in three months.  2. Therapeutic: Continue her Synthroid doses of 1.5 of the 112 mcg pills per day every day. Take MVI about dinner time.  3. Patient education: We discussed her lab results and the need to follow her closely.  4. Follow-up: 6 months  Level of Service: This visit lasted in excess of 45 minutes. More than 50% of the visit was devoted to counseling.  Molli Knock, MD, CDE Adult and Pediatric Endocrinology

## 2019-08-08 ENCOUNTER — Ambulatory Visit (INDEPENDENT_AMBULATORY_CARE_PROVIDER_SITE_OTHER): Payer: BC Managed Care – PPO | Admitting: "Endocrinology

## 2019-08-08 ENCOUNTER — Encounter (INDEPENDENT_AMBULATORY_CARE_PROVIDER_SITE_OTHER): Payer: Self-pay | Admitting: "Endocrinology

## 2019-08-08 ENCOUNTER — Other Ambulatory Visit: Payer: Self-pay

## 2019-08-08 VITALS — BP 116/68 | HR 88 | Wt 161.6 lb

## 2019-08-08 DIAGNOSIS — E063 Autoimmune thyroiditis: Secondary | ICD-10-CM | POA: Diagnosis not present

## 2019-08-08 DIAGNOSIS — E049 Nontoxic goiter, unspecified: Secondary | ICD-10-CM | POA: Diagnosis not present

## 2019-08-08 DIAGNOSIS — E349 Endocrine disorder, unspecified: Secondary | ICD-10-CM

## 2019-08-08 DIAGNOSIS — R5383 Other fatigue: Secondary | ICD-10-CM

## 2019-08-08 DIAGNOSIS — I959 Hypotension, unspecified: Secondary | ICD-10-CM | POA: Diagnosis not present

## 2019-08-08 DIAGNOSIS — R7303 Prediabetes: Secondary | ICD-10-CM

## 2019-08-08 DIAGNOSIS — D508 Other iron deficiency anemias: Secondary | ICD-10-CM | POA: Diagnosis not present

## 2019-08-08 DIAGNOSIS — R231 Pallor: Secondary | ICD-10-CM

## 2019-08-08 MED ORDER — SYNTHROID 112 MCG PO TABS: ORAL_TABLET | ORAL | 12 refills | Status: DC

## 2019-08-08 NOTE — Patient Instructions (Signed)
Follow up visit in 6 months. 

## 2019-08-09 ENCOUNTER — Other Ambulatory Visit (INDEPENDENT_AMBULATORY_CARE_PROVIDER_SITE_OTHER): Payer: Self-pay

## 2019-08-09 ENCOUNTER — Encounter (INDEPENDENT_AMBULATORY_CARE_PROVIDER_SITE_OTHER): Payer: Self-pay

## 2019-08-09 MED ORDER — SYNTHROID 112 MCG PO TABS: ORAL_TABLET | ORAL | 12 refills | Status: DC

## 2019-08-17 ENCOUNTER — Encounter (INDEPENDENT_AMBULATORY_CARE_PROVIDER_SITE_OTHER): Payer: Self-pay

## 2019-08-17 MED ORDER — LEVOTHYROXINE SODIUM 112 MCG PO TABS: 112.0000 ug | ORAL_TABLET | Freq: Every day | ORAL | 11 refills | Status: DC

## 2019-08-22 ENCOUNTER — Other Ambulatory Visit (INDEPENDENT_AMBULATORY_CARE_PROVIDER_SITE_OTHER): Payer: Self-pay

## 2019-08-22 MED ORDER — LEVOTHYROXINE SODIUM 112 MCG PO TABS: ORAL_TABLET | ORAL | 11 refills | Status: DC

## 2019-10-05 ENCOUNTER — Other Ambulatory Visit (HOSPITAL_COMMUNITY)
Admission: RE | Admit: 2019-10-05 | Discharge: 2019-10-05 | Disposition: A | Discharge status: Home / Self Care | Payer: BC Managed Care – PPO | Source: Ambulatory Visit | Attending: Family Medicine | Admitting: Family Medicine

## 2019-10-05 DIAGNOSIS — Z0000 Encounter for general adult medical examination without abnormal findings: Secondary | ICD-10-CM | POA: Diagnosis not present

## 2019-10-05 DIAGNOSIS — Z124 Encounter for screening for malignant neoplasm of cervix: Secondary | ICD-10-CM | POA: Diagnosis not present

## 2019-10-05 DIAGNOSIS — Z13220 Encounter for screening for lipoid disorders: Secondary | ICD-10-CM | POA: Diagnosis not present

## 2019-10-05 DIAGNOSIS — E039 Hypothyroidism, unspecified: Secondary | ICD-10-CM | POA: Diagnosis not present

## 2019-10-10 ENCOUNTER — Other Ambulatory Visit: Payer: Self-pay | Admitting: Family Medicine

## 2019-10-10 DIAGNOSIS — R1031 Right lower quadrant pain: Secondary | ICD-10-CM

## 2019-10-10 LAB — CYTOLOGY - PAP
Comment: NEGATIVE
Diagnosis: NEGATIVE
High risk HPV: NEGATIVE

## 2019-10-18 ENCOUNTER — Other Ambulatory Visit: Payer: Medicaid Other

## 2019-10-25 ENCOUNTER — Ambulatory Visit
Admission: RE | Admit: 2019-10-25 | Discharge: 2019-10-25 | Disposition: A | Discharge status: Home / Self Care | Payer: Medicaid Other | Source: Ambulatory Visit | Attending: Family Medicine | Admitting: Family Medicine

## 2019-10-25 DIAGNOSIS — R1031 Right lower quadrant pain: Secondary | ICD-10-CM

## 2019-10-25 DIAGNOSIS — R102 Pelvic and perineal pain: Secondary | ICD-10-CM | POA: Diagnosis not present

## 2019-11-20 DIAGNOSIS — D509 Iron deficiency anemia, unspecified: Secondary | ICD-10-CM | POA: Diagnosis not present

## 2019-11-20 DIAGNOSIS — E039 Hypothyroidism, unspecified: Secondary | ICD-10-CM | POA: Diagnosis not present

## 2019-11-20 DIAGNOSIS — M2550 Pain in unspecified joint: Secondary | ICD-10-CM | POA: Diagnosis not present

## 2019-11-20 DIAGNOSIS — E559 Vitamin D deficiency, unspecified: Secondary | ICD-10-CM | POA: Diagnosis not present

## 2019-11-20 DIAGNOSIS — R5383 Other fatigue: Secondary | ICD-10-CM | POA: Diagnosis not present

## 2020-02-05 ENCOUNTER — Ambulatory Visit (INDEPENDENT_AMBULATORY_CARE_PROVIDER_SITE_OTHER): Payer: Medicaid Other | Admitting: "Endocrinology

## 2020-04-17 ENCOUNTER — Other Ambulatory Visit: Payer: Medicaid Other

## 2020-04-17 DIAGNOSIS — Z20822 Contact with and (suspected) exposure to covid-19: Secondary | ICD-10-CM | POA: Diagnosis not present

## 2020-04-18 LAB — SARS-COV-2, NAA 2 DAY TAT

## 2020-04-18 LAB — NOVEL CORONAVIRUS, NAA: SARS-CoV-2, NAA: NOT DETECTED

## 2020-08-06 ENCOUNTER — Other Ambulatory Visit: Payer: Medicaid Other

## 2020-08-07 ENCOUNTER — Other Ambulatory Visit: Payer: Self-pay

## 2020-08-07 ENCOUNTER — Other Ambulatory Visit: Payer: Medicaid Other

## 2020-08-07 DIAGNOSIS — Z20822 Contact with and (suspected) exposure to covid-19: Secondary | ICD-10-CM

## 2020-08-09 LAB — NOVEL CORONAVIRUS, NAA: SARS-CoV-2, NAA: NOT DETECTED

## 2020-08-09 LAB — SARS-COV-2, NAA 2 DAY TAT

## 2020-08-12 ENCOUNTER — Other Ambulatory Visit: Payer: Self-pay

## 2020-08-12 DIAGNOSIS — Z20822 Contact with and (suspected) exposure to covid-19: Secondary | ICD-10-CM | POA: Diagnosis not present

## 2020-08-13 DIAGNOSIS — J069 Acute upper respiratory infection, unspecified: Secondary | ICD-10-CM | POA: Diagnosis not present

## 2020-08-14 LAB — SARS-COV-2, NAA 2 DAY TAT

## 2020-08-14 LAB — NOVEL CORONAVIRUS, NAA: SARS-CoV-2, NAA: DETECTED — AB

## 2020-08-15 ENCOUNTER — Telehealth: Payer: Self-pay

## 2020-08-15 NOTE — Telephone Encounter (Signed)
Called to discuss with patient about COVID-19 symptoms and the use of one of the available treatments for those with mild to moderate Covid symptoms and at a high risk of hospitalization.  Pt appears to qualify for outpatient treatment due to co-morbid conditions and/or a member of an at-risk group in accordance with the FDA Emergency Use Authorization.    Symptom onset: 08/05/20 Vaccinated: No Booster? No Immunocompromised? No Qualifiers: No  Pt. Out of 7 day window for treatment.   Grace Wall

## 2020-10-09 DIAGNOSIS — E039 Hypothyroidism, unspecified: Secondary | ICD-10-CM | POA: Diagnosis not present

## 2020-10-09 DIAGNOSIS — D509 Iron deficiency anemia, unspecified: Secondary | ICD-10-CM | POA: Diagnosis not present

## 2020-10-09 DIAGNOSIS — Z0000 Encounter for general adult medical examination without abnormal findings: Secondary | ICD-10-CM | POA: Diagnosis not present

## 2020-10-09 DIAGNOSIS — Z13220 Encounter for screening for lipoid disorders: Secondary | ICD-10-CM | POA: Diagnosis not present

## 2020-11-26 DIAGNOSIS — Z3201 Encounter for pregnancy test, result positive: Secondary | ICD-10-CM | POA: Diagnosis not present

## 2020-11-26 DIAGNOSIS — Z3689 Encounter for other specified antenatal screening: Secondary | ICD-10-CM | POA: Diagnosis not present

## 2020-12-23 ENCOUNTER — Other Ambulatory Visit (INDEPENDENT_AMBULATORY_CARE_PROVIDER_SITE_OTHER): Payer: Self-pay

## 2020-12-23 DIAGNOSIS — R231 Pallor: Secondary | ICD-10-CM

## 2020-12-23 DIAGNOSIS — R7303 Prediabetes: Secondary | ICD-10-CM

## 2020-12-23 DIAGNOSIS — E049 Nontoxic goiter, unspecified: Secondary | ICD-10-CM

## 2020-12-23 DIAGNOSIS — E063 Autoimmune thyroiditis: Secondary | ICD-10-CM

## 2020-12-23 DIAGNOSIS — D508 Other iron deficiency anemias: Secondary | ICD-10-CM

## 2020-12-23 DIAGNOSIS — R5383 Other fatigue: Secondary | ICD-10-CM

## 2020-12-23 DIAGNOSIS — H0520 Unspecified exophthalmos: Secondary | ICD-10-CM

## 2020-12-23 DIAGNOSIS — E349 Endocrine disorder, unspecified: Secondary | ICD-10-CM

## 2020-12-23 DIAGNOSIS — I959 Hypotension, unspecified: Secondary | ICD-10-CM

## 2020-12-23 NOTE — Progress Notes (Signed)
Subjective:  Patient Name: Grace Wall Date of Birth: 29-May-1986  MRN: [id number]  Grace Wall  presents to the office today for follow-up of her diffuse toxic goiter Grace Wall' disease), hypothyroidism, Hashimoto's disease, exophthalmos, iron deficiency anemia, and obesity.  HISTORY OF PRESENT ILLNESS:   Grace Wall is a 35 y.o. [place] young woman. [name] was accompanied by her husband.   1. The patient was first referred to me by herself on 05/08/05.   A. She was diagnosed with thyroid problems at age 89. At that point she was hyperthyroid, had a big anterior neck, and had big eyes, so she presumably had Graves' disease. She was placed on Tapazole (methimazole). Tapazole was subsequently stopped at about age 26.  B. About one year later, she became hypothyroid, presumably due to coexistent Hashimoto's disease. She started Synthroid. The Synthroid was changed to generic levothyroxine in 2005. The patient's eyes had gradually improved over time. At that first visit with me, she was feeling fairly well, but was tired a lot. She was also somewhat mentally confused at times.   C. On physical examination she had intermittent proptosis bilaterally. The thyroid gland was 20+ grams in size. The right lobe was within normal limits, but the left lobe was slightly enlarged. Laboratory data showed a TSH of 3.09, free T4 1.22, and free T3 of 2.6. Her TPO antibody level was 1171.1, with normals being less than 60. A thyroid-stimulating immunoglobulin level was 64, with normals being <129. The thyroid binding inhibitory immunoglobulin was 96, with normals being less than 17. Her TSH receptor antibody was 87, with normals being less than 10. 2. Given the high levels of the thyroid receptor antibody, it was unclear how her thyroid tests would evolve. Repeat thyroid hormone tests performed on 05/18/2005 were markedly different. At this time the TSH was 0.032,  free T4 was 1.77, and free T3 was 3.9. At that point it was  still unclear whether the fluctuations in thyroid hormone were due to changes in the forms of levothyroxine made by different companies, or to  changes in thyroid receptor antibody levels, or both. I changed her to brand Synthroid at a dose of 150 mcg per day.   2. During the past 16 years we have followed her for several problems:  A. We've had to adjust her thyroid hormone doses frequently. Her TSH values have ranged from 0.04 - 8.58. Part of these fluctuations have been due to flare ups of Hashimoto's disease. In addition, because she has not had any health insurance during most of this time, she's often had to rely on thyroid hormone samples. Sometimes she's not always had exactly the right thyroid hormone doses. She also appears to have had fluctuations in her thyroid receptor antibodies which have caused changes in her thyroid hormone levels.  Because of her lack of insurance, however, we had not been able to repeat her thyroid receptor antibody testing.  B. She has also had a problem with chronic iron deficiency anemia.   C. She has been obese/overweight.   D. In June 2016 her HbA1c was 5.9%. In September 2016 her HbA1c was 5.7%. The four HbA1c tests performed in 2017 were normal, varying from 5.3-5.4%. Due to her lack of insurance, we have not been able to perform this testing for the past two years.  3. The patient's last [facility] visit was on 08/08/19. At that visit I continued her Synthroid dose of 1.5 of the 112 mcg tablets daily. She was supposed to have lab  tests done in 3 months and return to see me in 6 months, but did not.  A. In the interim, she has been healthy. She is [redacted] weeks pregnant. She is sleeping well. Her emotions have been good. She is nauseous a lot. Her OB put her on promethazine, which makes her tired, but she can eat.    B. She is not usually taking any other medications, vitamins, or minerals in the mornings when she takes her Synthroid. She takes her prenatal vitamin with iron  in the evening.   C. She has been eating less due to nausea. She has been too tired to exercise.   D. She has not been having much trapezius and nuchal tightness and soreness, posterior headaches, and band-like headaches. The tingling numbness in both hands has resolved.                         4. Pertinent Review of Systems:  Constitutional: The patient feels "tired". Her energy level is "low". Her body temperature is normal.  Eyes: Vision is pretty good, but she sometimes needs her glasses for distance reading. There are no other significant eye complaints. She does not think that one eye is more prominent than the other. When she moves her eyes around she occasionally has a sensation of pressure or restriction.  Neck: The patient has no complaints of anterior neck swelling, soreness, tenderness,  pressure, discomfort, or difficulty swallowing.  Heart: Heart rate increases with exercise or other physical activity. The patient has no complaints of palpitations, irregular heat beats, chest pain, or chest pressure. Gastrointestinal: She is more constipated. She has some reflux and nausea, but no stomach aches or diarrhea. Hands: No problems Legs: Muscle mass and strength seem normal. There are no complaints of numbness, tingling, burning, or pain. She has not had swelling recently.   Feet: There are no obvious foot problems. There are no complaints of numbness, tingling, burning, or pain. No edema is noted. GYN: LMP was 10/19/20. Periods occurred regularly.    PAST MEDICAL, FAMILY, AND SOCIAL HISTORY:  Past Medical History:  Diagnosis Date  . Exophthalmos   . Hypothyroidism, acquired, autoimmune   . Supervision of low-risk pregnancy 11/28/2015    [facility] [place] [facility] Labs Dating  LMP Blood type: O/Positive/-- (05/11 1044)  Genetic Screen 1 Screen: negative   AFP:     Quad:     NIPS: Antibody:Negative (05/11 1044) Anatomic US  normal female fetus Rubella: 30.10 (05/11 1044) GTT Third  trimester: normal RPR: Non Reactive (10/11 1443)  Flu vaccine  05/11/16 HBsAg: Negative (05/11 1044)  TDaP vaccine   05/11/16                              HIV: Non Reactive (10/11 1443)  Baby Food  breast                          GBS:  Contraception undecided Pap: Negative (08/29/2015) Circumcision N/a female  Pediatrician  [facility]  Support Person husband     . Thyroiditis, autoimmune     Family History  Problem Relation Age of Onset  . Cancer Maternal Aunt   . Thyroid disease Son   . Diabetes Mother   . Hypertension Father      Current Outpatient Medications:  .  levothyroxine (SYNTHROID) 112 MCG tablet, Take 1.5 tablet  as instructed by provider., Disp: 45 tablet, Rfl: 11 .  promethazine (PHENERGAN) 25 MG tablet, Take 25 mg by mouth every 6 (six) hours as needed., Disp: , Rfl:  .  ferrous sulfate (FERROUSUL) 325 (65 FE) MG tablet, Take 1 pill daily (Patient not taking: Reported on 12/24/2020), Disp: 90 tablet, Rfl: 3 .  ibuprofen (ADVIL,MOTRIN) 600 MG tablet, Take 600 mg by mouth every 6 (six) hours as needed. (Patient not taking: Reported on 12/24/2020), Disp: , Rfl:  .  Prenatal Vit-Fe Fumarate-FA (PRENATAL MULTIVITAMIN) TABS tablet, Take 1 tablet by mouth daily at 12 noon., Disp: , Rfl:   Allergies as of 12/24/2020  . (No Known Allergies)    1. 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 She now has Medicaid.  2. Activities: She stays busy with child care.  3. Smoking, alcohol, or drugs: None 4. Primary Care Provider: She no longer has a PCP.  5. OB: [facility]  REVIEW OF SYSTEMS: There are no other significant problems involving the patient's other body systems.   Objective:  Vital Signs:  BP 118/76 (BP Location: Right Arm, Patient Position: Sitting, Cuff Size: Normal)   Pulse 70   Wt 169 lb 6.4 oz (76.8 kg)   BMI 27.55 kg/m   Wt Readings from Last 3 Encounters:  12/24/20 169 lb 6.4 oz (76.8 kg)  08/08/19 161 lb 9.6  oz (73.3 kg)  09/26/18 161 lb 6.4 oz (73.2 kg)   PHYSICAL EXAM: Constitutional: The patient appears healthy, but overweight and tired. Her weight has increased 8 pounds in 17 months. She is alert, oriented, and looks pretty good. She has normal affect and insight today.  Eyes:  There is no obvious arcus. Her right eye is still a tiny bit more prominent than the left, but neither eye appears unusually prominent. She has no inferior proptosis today,  Extraocular movements of her eyes are essentially normal, but upward and lateral movement of the right eye is a tiny bit restricted. She feels no sense of eye muscle pressure when looking upward and left or looking upward and right. Moisture appears normal. Mouth: The oropharynx and tongue appear normal. Oral moisture is normal. There is no hyperpigmentation.  Neck: The neck appears to be visibly normal. No carotid bruits are noted. The thyroid gland is top-normal size on the right and very mildly enlarged on the left.  The consistency of the thyroid gland is normal. The thyroid gland is not tender to palpation.  Lungs: The lungs are clear to auscultation. Air movement is good. Heart: Heart rate and rhythm are regular. Heart sounds S1 and S2 are normal.She has a grade 1/6 SEM that sounds innocent.  I did not appreciate any pathologic cardiac murmurs or heart sounds. Abdomen: The abdomen is enlarged. Bowel sounds are normal. There is no obvious hepatomegaly, splenomegaly, or other mass effect.  Arms: Muscle size and bulk are normal for age.  Hands: There is no obvious tremor. Phalangeal and metacarpophalangeal joints are normal. Palmar muscles are normal for age. Palmar skin shows 2+ palmar erythema. Palmar moisture is normal. There is no hyperpigmentation. Her nails are no longer pallid today.  Legs: Muscles appear normal for age. No edema is present. Neurologic: Strength is normal for age in both the upper and lower extremities. Muscle tone is normal.  Sensation to touch is normal in both legs.    LAB DATA:   Labs 12/23/20: HbA1c 5.4%; TSH 2.30, free T4 1.0,  free T3 2.3; CBC normal, except RDW 15.5 (ref 11-15) and platelets 133 (ref 140-400); iron 70 (ref 40-190)  Labs 08/07/19: CBC normal; iron 67 (ref 40-190)  Labs 04/10/19: HbA1c 5.6%; TSH 0.51, free T4 1.3, free T3 3.2; CBC normal, except Hgb 9.5 (ref 11.7-15.5), Hct 29.8 (ref 35-45), MCV 74.1 (ref 80-100), MCH 23.6 (ref 27-33), MCHC 31.9 (ref 32-36); iron 24 (ref 40-190)  Labs 08/23/18: TSH 8.83  Labs 01/06/18: TSH 0.83, free T4 1.4, free T3 3.1  Labs 1205/18: TSH 0.05, free T4 1.3, free T3 3.4  Labs 06/17/16: HbA1c 5.4%, CBG 103  Labs 06/15/16: TSH 0.26, free T4 1.2, free T3 2.3  Labs 05/07/16: TSH 0.47, free T4 1.1, free T3 2.5  8//28/17: TSH 3.54, free T4 1.2, free T3 2.2; HbA1c 5.4%  02/04/16: CBG 92 after breakfast, HbA1c 5.4%  02/03/16: TSH 5.85, free T4 1.0, free T3 1.8  12/04/15: TSH 3.52, free T4 1.1, free T3 2.2; iron 51; HbA1c 5.3%  11/28/15: TSH 2.330; CBC normal except for MCH of 26.7 (normal 27-33)  11/18/15: Positive pregnancy test  04/15/15: HbA1c 5.7%; Hgb 11.7, Hct 35.7, iron 36 (normal 40-190); TSH 1.151, free T4 1.08, free T3 2.8  01/10/15: HbA1c 5.9%; Hgb 11.9, Hct 36.3, iron 43; TSH 2.853, free T4 0.83, free T3 2.5  10/15/14: Hgb 11.3, Hct 35.6, MCH 25.6,Iron 24; TSH 2.549, free T4 0.99, free T3 2.4  04/13/14: Iron 24; Hgb 11.1, Hct 32.6%, MCV 74.9; TSH 0.122, free T4 1.45, free T3 3.3  08/04/12: TSH 0.824, free T4 1.26, free T3 3.4  02/01/13: Iron 84, Hgb 11.8, Hct 34.8%  01/30/13; TSH 1.397, free T4 1.44, free T3 2.8  08/02/12: TSH 3.078, free T4 1.14, free T3 2.7  12/15/11: TSH was 0.520, free T4 1.40, free T3  2.7   Assessment and Plan:   ASSESSMENT: 1. Hypothyroid/hyperthyroid:   A. During the past 14 years, Shunda TFTs have varied over time with her pregnancies, other health conditions, and levothyroxine/Synthroid doses.    B. Her TFTs in  September 2020 were at the upper end of the reference range. Her TFTs in June 2022 were normal, but at about the 25% of the physiologic range. Since she is taking levothyroxine, we will convert her to Synthroid, which in many cases will be more effective. We will repeat her TFTs every two months. We will adjust her Synthroid doses to try to keep her TSH in the goal range of 1.0-2.0.  2. Thyroiditis: Her Hashimoto's disease is clinically quiescent today.  3. Goiter: Her thyroid gland is a bit enlarged today. The process of waxing and waning of thyroid gland size is c/w evolving thyroiditis. 4. Exophthalmos: The eyes are essentially within normal limits in terms of prominence again today, but she still has a very mild degree of extraocular movement restriction of her right eye.   5. Overweight: Her weight has increased.  She needs to continue to Eat Right and to walk an hour a day or perform equivalent exercise.  6-9. Fatigue/pallor/coldness/iron deficiency anemia:   A. Her pallor and iron deficiency anemia have resolved after resuming iron treatment.   B. She is still tired, now due to promethazine.   10. Hypotension: Her BP is good. Her dizziness has resolved.  11. Pre-diabetes: Her HbA1c was in the prediabetic range in June and September 2016, was 5.3% at her June 2017 visit, but had decreased to 5.3% in May 2017, then increased to 5.4% at her visits in July, August, and November  2017. He HbA1c in September 2020 had increased to 5.6%, but has decreased to 5.4% today in June 2022.    PLAN: 1. Diagnostic: Repeat her TFTs, CBC, iron, and HbA1c in two months.  2. Therapeutic: Take Synthroid doses of 1.5 of the 112 mcg pills per day every day. Take MVI about dinner time.  3. Patient education: We discussed her lab results and the need to follow her closely.  4. Follow-up: 2 months  Level of Service: This visit lasted in excess of 50 minutes. More than 50% of the visit was devoted to  counseling.  Molli Knock, MD, CDE Adult and Pediatric Endocrinology

## 2020-12-24 ENCOUNTER — Encounter (INDEPENDENT_AMBULATORY_CARE_PROVIDER_SITE_OTHER): Payer: Self-pay | Admitting: "Endocrinology

## 2020-12-24 ENCOUNTER — Ambulatory Visit (INDEPENDENT_AMBULATORY_CARE_PROVIDER_SITE_OTHER): Payer: Medicaid Other | Admitting: "Endocrinology

## 2020-12-24 ENCOUNTER — Other Ambulatory Visit: Payer: Self-pay

## 2020-12-24 VITALS — BP 118/76 | HR 70 | Wt 169.4 lb

## 2020-12-24 DIAGNOSIS — E063 Autoimmune thyroiditis: Secondary | ICD-10-CM

## 2020-12-24 DIAGNOSIS — R7303 Prediabetes: Secondary | ICD-10-CM

## 2020-12-24 DIAGNOSIS — E039 Hypothyroidism, unspecified: Secondary | ICD-10-CM

## 2020-12-24 DIAGNOSIS — E049 Nontoxic goiter, unspecified: Secondary | ICD-10-CM

## 2020-12-24 DIAGNOSIS — E349 Endocrine disorder, unspecified: Secondary | ICD-10-CM

## 2020-12-24 DIAGNOSIS — D500 Iron deficiency anemia secondary to blood loss (chronic): Secondary | ICD-10-CM

## 2020-12-24 DIAGNOSIS — O99281 Endocrine, nutritional and metabolic diseases complicating pregnancy, first trimester: Secondary | ICD-10-CM

## 2020-12-24 LAB — CBC WITH DIFFERENTIAL/PLATELET
Absolute Monocytes: 280 cells/uL (ref 200–950)
Basophils Absolute: 11 cells/uL (ref 0–200)
Basophils Relative: 0.2 %
Eosinophils Absolute: 22 cells/uL (ref 15–500)
Eosinophils Relative: 0.4 %
HCT: 37.8 % (ref 35.0–45.0)
Hemoglobin: 12.5 g/dL (ref 11.7–15.5)
Lymphs Abs: 1254 cells/uL (ref 850–3900)
MCH: 28.7 pg (ref 27.0–33.0)
MCHC: 33.1 g/dL (ref 32.0–36.0)
MCV: 86.9 fL (ref 80.0–100.0)
MPV: 11.7 fL (ref 7.5–12.5)
Monocytes Relative: 5 %
Neutro Abs: 4032 cells/uL (ref 1500–7800)
Neutrophils Relative %: 72 %
Platelets: 133 10*3/uL — ABNORMAL LOW (ref 140–400)
RBC: 4.35 10*6/uL (ref 3.80–5.10)
RDW: 15.5 % — ABNORMAL HIGH (ref 11.0–15.0)
Total Lymphocyte: 22.4 %
WBC: 5.6 10*3/uL (ref 3.8–10.8)

## 2020-12-24 LAB — HEMOGLOBIN A1C
Hgb A1c MFr Bld: 5.4 % of total Hgb (ref <5.7)
Mean Plasma Glucose: 108 mg/dL
eAG (mmol/L): 6 mmol/L

## 2020-12-24 LAB — TSH: TSH: 2.3 mIU/L

## 2020-12-24 LAB — T3, FREE: T3, Free: 2.3 pg/mL (ref 2.3–4.2)

## 2020-12-24 LAB — IRON: Iron: 70 ug/dL (ref 40–190)

## 2020-12-24 LAB — T4, FREE: Free T4: 1 ng/dL (ref 0.8–1.8)

## 2020-12-24 MED ORDER — SYNTHROID 112 MCG PO TABS: ORAL_TABLET | ORAL | 6 refills | Status: DC

## 2020-12-24 NOTE — Patient Instructions (Signed)
Follow up visit in 2 months. Please repeat lab tests about one week prior.

## 2020-12-25 DIAGNOSIS — E039 Hypothyroidism, unspecified: Secondary | ICD-10-CM | POA: Insufficient documentation

## 2021-01-07 LAB — OB RESULTS CONSOLE ABO/RH: RH Type: POSITIVE

## 2021-01-07 LAB — OB RESULTS CONSOLE HIV ANTIBODY (ROUTINE TESTING): HIV: NONREACTIVE

## 2021-01-07 LAB — OB RESULTS CONSOLE RUBELLA ANTIBODY, IGM: Rubella: IMMUNE

## 2021-01-07 LAB — HEPATITIS C ANTIBODY: HCV Ab: NEGATIVE

## 2021-01-07 LAB — OB RESULTS CONSOLE GC/CHLAMYDIA
Chlamydia: NEGATIVE
Gonorrhea: NEGATIVE

## 2021-01-07 LAB — OB RESULTS CONSOLE RPR: RPR: NONREACTIVE

## 2021-01-07 LAB — OB RESULTS CONSOLE HEPATITIS B SURFACE ANTIGEN: Hepatitis B Surface Ag: NEGATIVE

## 2021-01-07 LAB — OB RESULTS CONSOLE ANTIBODY SCREEN: Antibody Screen: NEGATIVE

## 2021-02-25 ENCOUNTER — Other Ambulatory Visit (INDEPENDENT_AMBULATORY_CARE_PROVIDER_SITE_OTHER): Payer: Self-pay

## 2021-02-25 ENCOUNTER — Encounter (INDEPENDENT_AMBULATORY_CARE_PROVIDER_SITE_OTHER): Payer: Self-pay

## 2021-02-25 DIAGNOSIS — D500 Iron deficiency anemia secondary to blood loss (chronic): Secondary | ICD-10-CM

## 2021-02-25 DIAGNOSIS — E063 Autoimmune thyroiditis: Secondary | ICD-10-CM

## 2021-02-25 DIAGNOSIS — R231 Pallor: Secondary | ICD-10-CM

## 2021-02-25 DIAGNOSIS — E049 Nontoxic goiter, unspecified: Secondary | ICD-10-CM

## 2021-02-25 LAB — CBC WITH DIFFERENTIAL/PLATELET
Absolute Monocytes: 367 cells/uL (ref 200–950)
Basophils Absolute: 7 cells/uL (ref 0–200)
Basophils Relative: 0.1 %
Eosinophils Absolute: 22 cells/uL (ref 15–500)
Eosinophils Relative: 0.3 %
HCT: 34.2 % — ABNORMAL LOW (ref 35.0–45.0)
Hemoglobin: 11.4 g/dL — ABNORMAL LOW (ref 11.7–15.5)
Lymphs Abs: 1627 cells/uL (ref 850–3900)
MCH: 30.1 pg (ref 27.0–33.0)
MCHC: 33.3 g/dL (ref 32.0–36.0)
MCV: 90.2 fL (ref 80.0–100.0)
MPV: 12.1 fL (ref 7.5–12.5)
Monocytes Relative: 5.1 %
Neutro Abs: 5177 cells/uL (ref 1500–7800)
Neutrophils Relative %: 71.9 %
Platelets: 124 10*3/uL — ABNORMAL LOW (ref 140–400)
RBC: 3.79 10*6/uL — ABNORMAL LOW (ref 3.80–5.10)
RDW: 13.8 % (ref 11.0–15.0)
Total Lymphocyte: 22.6 %
WBC: 7.2 10*3/uL (ref 3.8–10.8)

## 2021-02-25 LAB — HEMOGLOBIN A1C
Hgb A1c MFr Bld: 5.1 % of total Hgb (ref <5.7)
Mean Plasma Glucose: 100 mg/dL
eAG (mmol/L): 5.5 mmol/L

## 2021-02-25 LAB — TSH: TSH: 3.53 mIU/L

## 2021-02-25 LAB — T3, FREE: T3, Free: 2.3 pg/mL (ref 2.3–4.2)

## 2021-02-25 LAB — T4, FREE: Free T4: 1 ng/dL (ref 0.8–1.8)

## 2021-02-25 LAB — IRON: Iron: 96 ug/dL (ref 40–190)

## 2021-02-25 MED ORDER — SYNTHROID 112 MCG PO TABS: ORAL_TABLET | ORAL | 6 refills | Status: DC

## 2021-02-25 NOTE — Progress Notes (Signed)
Subjective:  Patient Name: Grace Wall Date of Birth: 05/27/1986  MRN: [id number]  Grace Wall Grace Wall  presents to the office today for follow-up of her diffuse toxic goiter Grace Wall(Graves' disease), hypothyroidism, Hashimoto's disease, exophthalmos, iron deficiency anemia, obesity, and third pregnancy.  HISTORY OF PRESENT ILLNESS:   Grace Wall is a 35 y.o. [place] young woman. Grace Wall was accompanied by her husband.   1. The patient was first referred to me by herself on 05/08/05.   A. She was diagnosed with thyroid problems at age 89. At that point she was hyperthyroid, had a big anterior neck, and had big eyes, so she presumably had Graves' disease. She was placed on Tapazole (methimazole). Tapazole was subsequently stopped at about age 35.  B. About one year later, she became hypothyroid, presumably due to coexistent Hashimoto's disease. She started Synthroid. The Synthroid was changed to generic levothyroxine in 2005. The patient's eyes had gradually improved over time. At that first visit with me, she was feeling fairly well, but was tired a lot. She was also somewhat mentally confused at times.   C. On physical examination she had intermittent proptosis bilaterally. The thyroid gland was 20+ grams in size. The right lobe was within normal limits, but the left lobe was slightly enlarged. Laboratory data showed a TSH of 3.09, free T4 1.22, and free T3 of 2.6. Her TPO antibody level was 1171.1, with normals being less than 60. A thyroid-stimulating immunoglobulin level was 64, with normals being <129. The thyroid binding inhibitory immunoglobulin was 96, with normals being less than 17. Her TSH receptor antibody was 87, with normals being less than 10. 2. Given the high levels of the thyroid receptor antibody, it was unclear how her thyroid tests would evolve. Repeat thyroid hormone tests performed on 05/18/2005 were markedly different. At this time the TSH was 0.032,  free T4 was 1.77, and free T3 was 3.9. At  that point it was still unclear whether the fluctuations in thyroid hormone were due to changes in the forms of levothyroxine made by different companies, or to  changes in thyroid receptor antibody levels, or both. I changed her to brand Synthroid at a dose of 150 mcg per day.   2. During the past 16 years we have followed her for several problems:  A. We've had to adjust her thyroid hormone doses frequently. Her TSH values have ranged from 0.04 - 8.58. Part of these fluctuations have been due to flare ups of Hashimoto's disease. In addition, because she has not had any health insurance during most of this time, she's often had to rely on thyroid hormone samples. Sometimes she's not always had exactly the right thyroid hormone doses. She also appears to have had fluctuations in her thyroid receptor antibodies which have caused changes in her thyroid hormone levels.  Because of her lack of insurance, however, we had not been able to repeat her thyroid receptor antibody testing.  B. She has also had a problem with chronic iron deficiency anemia.   C. She has been obese/overweight.   D. In June 2016 her HbA1c was 5.9%. In September 2016 her HbA1c was 5.7%. The four HbA1c tests performed in 2017 were normal, varying from 5.3-5.4%. Due to her lack of insurance, we have not been able to perform this testing for the past two years.  E. She is now pregnant. Her EDC is in January 7th, 2023.  3. The patient's last [facility] visit was on 12/24/20. At that visit I continued her Synthroid  dose of 1.5 of the 112 mcg tablets daily. However, after reviewing her lab results from 02/25/21 I changed her levothyroxine dosage to two of the 112 mcg tablets/day on even-numbered days and 1.5 tablets/day on odd-numbered days.   A. In the interim, she has been healthy. She is about 4 months pregnant. She is sleeping well. Her emotions have been good. Her morning sickness has almost resolved. Her energy is somewhat low. Her OB put her on  promethazine, which makes her tired, but she is tapering off the promethazine.    B. She is not usually taking any other medications, vitamins, or minerals in the mornings when she takes her Synthroid about 7 AM. She takes her prenatal vitamin with iron in the evening about 8 PM.   C. She has been eating more as the morning sickness abates. She has been too tired to exercise.   D. She has not been having much trapezius and nuchal tightness and soreness, posterior headaches, and band-like headaches. The tingling numbness in both hands has resolved.                         4. Pertinent Review of Systems:  Constitutional: The patient feels "good, but still tired". Her energy level is "low". Her body temperature is normal.  Eyes: Vision is pretty good, but she sometimes needs her glasses for distance reading. There are no other significant eye complaints. She does not think that one eye is more prominent than the other. When she moves her eyes around she no longer has a sensation of pressure or restriction.  Neck: The patient has no complaints of anterior neck swelling, soreness, tenderness,  pressure, discomfort, or difficulty swallowing.  Heart: Heart rate increases with exercise or other physical activity. The patient has no complaints of palpitations, irregular heat beats, chest pain, or chest pressure. Gastrointestinal: She is not often constipated. She no longer has reflux and nausea.  Hands: No problems Legs: Muscle mass and strength seem normal. There are no complaints of numbness, tingling, burning, or pain. She has not had swelling recently.   Feet: There are no obvious foot problems. There are no complaints of numbness, tingling, burning, or pain. No edema is noted. GYN: LMP was 10/19/20. Periods occurred regularly. EDC is January 7th 2023.    PAST MEDICAL, FAMILY, AND SOCIAL HISTORY:  Past Medical History:  Diagnosis Date   Exophthalmos    Hypothyroidism, acquired, autoimmune     Supervision of low-risk pregnancy 11/28/2015    Clinic  [facility] [place] Prenatal Labs Dating  LMP Blood type: O/Positive/-- (05/11 1044)  Genetic Screen 1 Screen: negative   AFP:     Quad:     NIPS: Antibody:Negative (05/11 1044) Anatomic [place]  normal female fetus Rubella: 30.10 (05/11 1044) GTT Third trimester: normal RPR: Non Reactive (10/11 1443)  Flu vaccine  05/11/16 HBsAg: Negative (05/11 1044)  TDaP vaccine   05/11/16                              HIV: Non Reactive (10/11 1443)  Baby Food  breast                          GBS:  Contraception undecided Pap: Negative (08/29/2015) Circumcision N/a female  Pediatrician  [facility]  Support Person husband      Thyroiditis, autoimmune     Family  History  Problem Relation Age of Onset   Cancer Maternal Aunt    Thyroid disease Son    Diabetes Mother    Hypertension Father      Current Outpatient Medications:    ferrous sulfate (FERROUSUL) 325 (65 FE) MG tablet, Take 1 pill daily, Disp: 90 tablet, Rfl: 3   Prenatal Vit-Fe Fumarate-FA (PRENATAL MULTIVITAMIN) TABS tablet, Take 1 tablet by mouth daily at 12 noon., Disp: , Rfl:    promethazine (PHENERGAN) 25 MG tablet, Take 25 mg by mouth every 6 (six) hours as needed., Disp: , Rfl:    SYNTHROID 112 MCG tablet, Take two 112 mcg tablets per day on even-numbered days and 1.5 tablets on odd-numbered days., Disp: 45 tablet, Rfl: 6   ibuprofen (ADVIL,MOTRIN) 600 MG tablet, Take 600 mg by mouth every 6 (six) hours as needed. (Patient not taking: No sig reported), Disp: , Rfl:   Allergies as of 02/26/2021   (No Known Allergies)    1. 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 She now has Medicaid.  2. Activities: She stays busy with child care.  3. Smoking, alcohol, or drugs: None 4. Primary Care Provider: She no longer has a PCP.  5. OB: [facility]  REVIEW OF SYSTEMS: There are no other significant problems involving the patient's other body systems.    Objective:  Vital Signs:  BP 108/60   Pulse 82   Wt 175 lb 12.8 oz (79.7 kg)   BMI 28.59 kg/m   Wt Readings from Last 3 Encounters:  02/26/21 175 lb 12.8 oz (79.7 kg)  12/24/20 169 lb 6.4 oz (76.8 kg)  08/08/19 161 lb 9.6 oz (73.3 kg)   PHYSICAL EXAM: Constitutional: The patient appears healthy, but overweight and tired. Her weight has increased 6 pounds in 2 months. She is alert, oriented, and looks pretty good. She has normal affect and insight today.  Eyes:  There is no obvious arcus. Her right eye is still a tiny bit more prominent than the left, but neither eye appears unusually prominent. She has no inferior proptosis today,  Extraocular movements of her eyes are essentially normal. Upward and lateral movements of the eyes are no longer restricted. She feels no sense of eye muscle pressure when looking upward and left or looking upward and right. Moisture appears normal. Mouth: The oropharynx and tongue appear normal. Oral moisture is normal. There is no hyperpigmentation.  Neck: The neck appears to be visibly normal. No carotid bruits are noted. The lobes and isthmus are enlarged today. The thyroid gland is mildly enlarged symmetrically to about 21+ grams. The consistency of the thyroid gland is somewhat full. The thyroid gland is not tender to palpation.  Lungs: The lungs are clear to auscultation. Air movement is good. Heart: Heart rate and rhythm are regular. Heart sounds S1 and S2 are normal. She had a grade 1/6 SEM that sounded innocent at her last visit, but I do not hear it today. I did not appreciate any pathologic cardiac murmurs or heart sounds. Abdomen: The abdomen is enlarged. Bowel sounds are normal. There is no obvious hepatomegaly, splenomegaly, or other mass effect.  Arms: Muscle size and bulk are normal for age.  Hands: There is a trace tremor. Phalangeal and metacarpophalangeal joints are normal. Palmar muscles are normal for age. Palmar skin shows trace palmar  erythema. Palmar moisture is normal. There is no hyperpigmentation. Her nails are no longer pallid today.  Legs: Muscles  appear normal for age. No edema is present. Neurologic: Strength is normal for age in both the upper and lower extremities. Muscle tone is normal. Sensation to touch is normal in both legs.    LAB DATA:   Labs 02/24/21: HbA1c 5.1%; TSH 3.53, free T4 1.0, free T3 2.3; CBC normal, except RBC 3.79 (ref 3.80-5.10), Hgb 11.4 (ref 11.7-15.5), and Hct 34.2 (ref 35-45), and platelets 124 (ref 140-400); iron 96 (ref 40-190)  Labs 12/23/20: HbA1c 5.4%; TSH 2.30, free T4 1.0, free T3 2.3; CBC normal, except RDW 15.5 (ref 11-15) and platelets 133 (ref 140-400); iron 70 (ref 40-190)  Labs 08/07/19: CBC normal; iron 67 (ref 40-190)  Labs 04/10/19: HbA1c 5.6%; TSH 0.51, free T4 1.3, free T3 3.2; CBC normal, except Hgb 9.5 (ref 11.7-15.5), Hct 29.8 (ref 35-45), MCV 74.1 (ref 80-100), MCH 23.6 (ref 27-33), MCHC 31.9 (ref 32-36); iron 24 (ref 40-190)  Labs 08/23/18: TSH 8.83  Labs 01/06/18: TSH 0.83, free T4 1.4, free T3 3.1  Labs 1205/18: TSH 0.05, free T4 1.3, free T3 3.4  Labs 06/17/16: HbA1c 5.4%, CBG 103  Labs 06/15/16: TSH 0.26, free T4 1.2, free T3 2.3  Labs 05/07/16: TSH 0.47, free T4 1.1, free T3 2.5  8//28/17: TSH 3.54, free T4 1.2, free T3 2.2; HbA1c 5.4%  02/04/16: CBG 92 after breakfast, HbA1c 5.4%  02/03/16: TSH 5.85, free T4 1.0, free T3 1.8  12/04/15: TSH 3.52, free T4 1.1, free T3 2.2; iron 51; HbA1c 5.3%  11/28/15: TSH 2.330; CBC normal except for MCH of 26.7 (normal 27-33)  11/18/15: Positive pregnancy test  04/15/15: HbA1c 5.7%; Hgb 11.7, Hct 35.7, iron 36 (normal 40-190); TSH 1.151, free T4 1.08, free T3 2.8  01/10/15: HbA1c 5.9%; Hgb 11.9, Hct 36.3, iron 43; TSH 2.853, free T4 0.83, free T3 2.5  10/15/14: Hgb 11.3, Hct 35.6, MCH 25.6,Iron 24; TSH 2.549, free T4 0.99, free T3 2.4  04/13/14: Iron 24; Hgb 11.1, Hct 32.6%, MCV 74.9; TSH 0.122, free T4 1.45, free T3  3.3  08/04/12: TSH 0.824, free T4 1.26, free T3 3.4  02/01/13: Iron 84, Hgb 11.8, Hct 34.8%  01/30/13; TSH 1.397, free T4 1.44, free T3 2.8  08/02/12: TSH 3.078, free T4 1.14, free T3 2.7  12/15/11: TSH was 0.520, free T4 1.40, free T3  2.7   Assessment and Plan:   ASSESSMENT: 1. Hypothyroid/hyperthyroid:   A. During the past 14 years, Jaileigh TFTs have varied over time with her pregnancies, other health conditions, and levothyroxine/Synthroid doses.    B. Her TFTs in September 2020 were at the upper end of the reference range. Her TFTs in June 2022 were normal, but at about the 25% of the physiologic range. Her TFTs in August 2022 were lower, so I increased her Synthroid dose. It appears that her placenta is metabolizing her Synthroid.  C. We will repeat her TFTs every two months. We will adjust her Synthroid doses to try to keep her TSH in the goal range of 1.0-2.0.  2. Thyroiditis: Her Hashimoto's disease is clinically quiescent today.  3. Goiter: Her thyroid gland is a bit more enlarged today. The process of waxing and waning of thyroid gland size is c/w evolving thyroiditis. 4. Exophthalmos: The eyes are essentially within normal limits in terms of prominence again today,. I do not see any restriction of eye movement today.  5. Overweight: Her weight has increased.  She needs to continue to Eat Right and to walk an hour a day or perform equivalent  exercise.  6-9. Fatigue/pallor/coldness/iron deficiency anemia:   A. Her pallor and iron deficiency anemia had resolved after resuming iron treatment. Unfortunately, she is now anemic again, even with a normal iron content.   B. She is still tired, now due in part to promethazine.   10. Hypotension: Her BP is good. Her dizziness has resolved.  11. Pre-diabetes: Her HbA1c was in the prediabetic range in June and September 2016, was 5.3% at her June 2017 visit, but had decreased to 5.3% in May 2017, then increased to 5.4% at her visits in July,  August, and November 2017. He HbA1c in September 2020 had increased to 5.6%, but had decreased to 5.4% in June 2022. Her HbA1c in August 2022 was mid-normal at 5.1%.    12. At risk for hypocalcemia and vitamin D deficiency: We will assess her vitamin D prior to her next visit.  13. Hypothyroid, pregnancy, second semester: As above  PLAN: 1. Diagnostic: Repeat her TFTs, 25-OH vitamin D, CBC, iron, and HbA1c in two months.  2. Therapeutic: Take Synthroid doses of 2 of the 112 mcg tablets per day on even-numbered days and 1.5 tablets per day on odd-numbered days. Take MVI about dinner time.  3. Patient education: We discussed her lab results and the need to follow her closely.  4. Follow-up: 2 months  Level of Service: This visit lasted in excess of 50 minutes. More than 50% of the visit was devoted to counseling.  Molli Knock, MD, CDE Adult and Pediatric Endocrinology

## 2021-02-26 ENCOUNTER — Other Ambulatory Visit: Payer: Self-pay

## 2021-02-26 ENCOUNTER — Encounter (INDEPENDENT_AMBULATORY_CARE_PROVIDER_SITE_OTHER): Payer: Self-pay | Admitting: "Endocrinology

## 2021-02-26 ENCOUNTER — Ambulatory Visit (INDEPENDENT_AMBULATORY_CARE_PROVIDER_SITE_OTHER): Payer: Medicaid Other | Admitting: "Endocrinology

## 2021-02-26 VITALS — BP 108/60 | HR 82 | Wt 175.8 lb

## 2021-02-26 DIAGNOSIS — D508 Other iron deficiency anemias: Secondary | ICD-10-CM

## 2021-02-26 DIAGNOSIS — O99282 Endocrine, nutritional and metabolic diseases complicating pregnancy, second trimester: Secondary | ICD-10-CM

## 2021-02-26 DIAGNOSIS — R7303 Prediabetes: Secondary | ICD-10-CM

## 2021-02-26 DIAGNOSIS — E663 Overweight: Secondary | ICD-10-CM | POA: Diagnosis not present

## 2021-02-26 DIAGNOSIS — E349 Endocrine disorder, unspecified: Secondary | ICD-10-CM | POA: Diagnosis not present

## 2021-02-26 DIAGNOSIS — E0500 Thyrotoxicosis with diffuse goiter without thyrotoxic crisis or storm: Secondary | ICD-10-CM | POA: Diagnosis not present

## 2021-02-26 DIAGNOSIS — E063 Autoimmune thyroiditis: Secondary | ICD-10-CM | POA: Diagnosis not present

## 2021-02-26 DIAGNOSIS — E559 Vitamin D deficiency, unspecified: Secondary | ICD-10-CM

## 2021-02-26 DIAGNOSIS — E039 Hypothyroidism, unspecified: Secondary | ICD-10-CM

## 2021-02-26 NOTE — Patient Instructions (Signed)
Follow up visit in 2 months. Please repeat lab tests about one week prior.   At [facility], we are committed to providing exceptional care. You will receive a patient satisfaction survey through text or email regarding your visit today. Your opinion is important to me. Comments are appreciated.

## 2021-05-13 NOTE — Progress Notes (Signed)
Subjective:  Patient Name: Grace Wall Date of Birth: [birth year]  MRN: [id number]  Grace Wall  presents to the office today for follow-up of her diffuse toxic goiter Grace Blare' disease), hypothyroidism, Hashimoto's disease, exophthalmos, iron deficiency anemia, obesity, and third pregnancy.  HISTORY OF PRESENT ILLNESS:   Grace Wall is a 34 y.o. [place] young woman. Grace Wall was accompanied by her mother.   1. The patient was first referred to me by herself on 05/08/05.   A. She was diagnosed with thyroid problems at age 44. At that point she was hyperthyroid, had a big anterior neck, and had big eyes, so she presumably had Graves' disease. She was placed on Tapazole (methimazole). Tapazole was subsequently stopped at about age 39.  B. About one year later, she became hypothyroid, presumably due to coexistent Hashimoto's disease. She started Synthroid. The Synthroid was changed to generic levothyroxine in 2005. The patient's eyes had gradually improved over time. At that first visit with me, she was feeling fairly well, but was tired a lot. She was also somewhat mentally confused at times.   C. On physical examination she had intermittent proptosis bilaterally. The thyroid gland was 20+ grams in size. The right lobe was within normal limits, but the left lobe was slightly enlarged. Laboratory data showed a TSH of 3.09, free T4 1.22, and free T3 of 2.6. Her TPO antibody level was 1171.1, with normals being less than 60. A thyroid-stimulating immunoglobulin level was 64, with normals being <129. The thyroid binding inhibitory immunoglobulin was 96, with normals being less than 17. Her TSH receptor antibody was 87, with normals being less than 10. 2. Given the high levels of the thyroid receptor antibody, it was unclear how her thyroid tests would evolve. Repeat thyroid hormone tests performed on 05/18/2005 were markedly different. At this time the TSH was 0.032,  free T4 was 1.77, and free T3 was 3.9. At  that point it was still unclear whether the fluctuations in thyroid hormone were due to changes in the forms of levothyroxine made by different companies, or to  changes in thyroid receptor antibody levels, or both. I changed her to brand Synthroid at a dose of 150 mcg per day.   2. During the past 16 years we have followed her for several problems:  A. We've had to adjust her thyroid hormone doses frequently. Her TSH values have ranged from 0.04 - 8.58. Part of these fluctuations have been due to flare ups of Hashimoto's disease. In addition, because she has not had any health insurance during most of this time, she's often had to rely on thyroid hormone samples. Sometimes she's not always had exactly the right thyroid hormone doses. She also appears to have had fluctuations in her thyroid receptor antibodies which have caused changes in her thyroid hormone levels.  Because of her lack of insurance, however, we had not been able to repeat her thyroid receptor antibody testing.  B. She has also had a problem with chronic iron deficiency anemia.   C. She has been obese/overweight.   D. In June 2016 her HbA1c was 5.9%. In September 2016 her HbA1c was 5.7%. The four HbA1c tests performed in 2017 were normal, varying from 5.3-5.4%. Due to her lack of insurance, we have not been able to perform this testing for the past two years.  E. She is now pregnant. Her EDC is in January 7th, 2023.  3. The patient's last [facility] visit was on 02/26/21. At that visit I continued her Synthroid  doses of two of the 112 mcg tablets/day on even-numbered days and 1.5 tablets/day on odd-numbered days.   A. In the interim, she has been healthy. She is almost 7 months pregnant. She is sleeping well. Her emotions have been good. Her morning sickness has resolved. Her energy is good overall. She no longer takes promethazine.    B. She is not usually taking any other medications, vitamins, or minerals in the mornings when she takes her  Synthroid about 7 AM. She was taking her prenatal vitamin with iron in the evening about 8 PM, but ran out a weeks ago.    C. She has been eating more. She has not been exercising much.   D. She has not been having much trapezius and nuchal tightness and soreness, posterior headaches, and band-like headaches. The tingling numbness in both hands has resolved.                         4. Pertinent Review of Systems:  Constitutional: The patient feels "good, but tired". Her energy level is "better". Her body temperature is normal.  Eyes: Vision is pretty good, but she sometimes needs her glasses for distance reading. There are no other significant eye complaints. She does not think that one eye is more prominent than the other. When she moves her eyes around she no longer has a sensation of pressure or restriction.  Neck: The patient has no complaints of anterior neck swelling, soreness, tenderness,  pressure, discomfort, or difficulty swallowing.  Heart: Heart rate increases with exercise or other physical activity. The patient has no complaints of palpitations, irregular heat beats, chest pain, or chest pressure. Gastrointestinal: She is sometimes constipated. She no longer has reflux and nausea.  Hands: No problems Legs: Muscle mass and strength seem normal. There are no complaints of numbness, tingling, burning, or pain. She has not had swelling recently.   Feet: There are no obvious foot problems. There are no complaints of numbness, tingling, burning, or pain. No edema is noted. GYN: LMP was 10/19/20. Periods occurred regularly. EDC is January 7th 2023.    PAST MEDICAL, FAMILY, AND SOCIAL HISTORY:  Past Medical History:  Diagnosis Date   Exophthalmos    Hypothyroidism, acquired, autoimmune    Supervision of low-risk pregnancy 11/28/2015    Clinic  [facility] [place] Prenatal Labs Dating  LMP Blood type: O/Positive/-- (05/11 1044)  Genetic Screen 1 Screen: negative   AFP:     Quad:      NIPS: Antibody:Negative (05/11 1044) Anatomic [place]  normal female fetus Rubella: 30.10 (05/11 1044) GTT Third trimester: normal RPR: Non Reactive (10/11 1443)  Flu vaccine  05/11/16 HBsAg: Negative (05/11 1044)  TDaP vaccine   05/11/16                              HIV: Non Reactive (10/11 1443)  Baby Food  breast                          GBS:  Contraception undecided Pap: Negative (08/29/2015) Circumcision N/a female  Pediatrician  [facility]  Support Person husband      Thyroiditis, autoimmune     Family History  Problem Relation Age of Onset   Cancer Maternal Aunt    Thyroid disease Son    Diabetes Mother    Hypertension Father      Current  Outpatient Medications:    ferrous sulfate (FERROUSUL) 325 (65 FE) MG tablet, Take 1 pill daily, Disp: 90 tablet, Rfl: 3   Prenatal Vit-Fe Fumarate-FA (PRENATAL MULTIVITAMIN) TABS tablet, Take 1 tablet by mouth daily at 12 noon., Disp: , Rfl:    SYNTHROID 112 MCG tablet, Take two 112 mcg tablets per day on even-numbered days and 1.5 tablets on odd-numbered days., Disp: 45 tablet, Rfl: 6   ibuprofen (ADVIL,MOTRIN) 600 MG tablet, Take 600 mg by mouth every 6 (six) hours as needed. (Patient not taking: No sig reported), Disp: , Rfl:    promethazine (PHENERGAN) 25 MG tablet, Take 25 mg by mouth every 6 (six) hours as needed. (Patient not taking: Reported on 05/14/2021), Disp: , Rfl:   Allergies as of 05/14/2021   (No Known Allergies)    1. 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 She now has Medicaid. Her mother now lives in the area.  2. Activities: She stays busy with child care.  3. Smoking, alcohol, or drugs: None 4. Primary Care Provider: She no longer has a PCP.  5. OB: [facility]  REVIEW OF SYSTEMS: There are no other significant problems involving the patient's other body systems.   Objective:  Vital Signs:  BP 114/72 (BP Location: Right Arm, Patient Position: Sitting, Cuff Size:  Normal)   Pulse 74   Wt 191 lb 12.8 oz (87 kg)   BMI 31.20 kg/m   Wt Readings from Last 3 Encounters:  05/14/21 191 lb 12.8 oz (87 kg)  02/26/21 175 lb 12.8 oz (79.7 kg)  12/24/20 169 lb 6.4 oz (76.8 kg)   PHYSICAL EXAM: Constitutional: The patient appears healthy, but overweight and tired. Her weight has increased 16 pounds in 2 months. She is alert, oriented, and looks good. She has normal affect and insight today.  Eyes:  There is no obvious arcus. Her right eye is no longer more prominent than the left. Neither eye appears unusually prominent. She has no inferior proptosis today,  Extraocular movements of her eyes are essentially normal. Upward and lateral movements of the eyes are no longer restricted. She feels a slight sense of eye muscle pressure when looking upward and to the right, but not upward and to the left. Moisture appears normal. Mouth: The oropharynx and tongue appear normal. Oral moisture is normal. There is no hyperpigmentation.  Neck: The neck appears to be visibly normal. No carotid bruits are noted. The lobes and isthmus are enlarged today. The thyroid gland is mildly enlarged, but smaller, at about 21 grams. The right lobe has shrunk back to top-normal size, but the left lobe is still enlarged. The consistency of the thyroid gland is somewhat full. The thyroid gland is not tender to palpation.  Lungs: The lungs are clear to auscultation. Air movement is good. Heart: Heart rate and rhythm are regular. Heart sounds S1 and S2 are normal. She has a grade 1/6 SEM that sounds like an innocent flow murmur. I did not appreciate any pathologic cardiac murmurs or heart sounds. Abdomen: The abdomen is more enlarged. Bowel sounds are normal. There is no obvious hepatomegaly, splenomegaly, or other mass effect.  Arms: Muscle size and bulk are normal for age.  Hands: There is no tremor. Phalangeal and metacarpophalangeal joints are normal. Palmar muscles are normal for age. Palmar skin  shows no palmar erythema. Palmar moisture is normal. There is no hyperpigmentation. Only the right fifth finger nail shows a small amount  of pallor.   Legs: Muscles appear normal for age. No edema is present. Neurologic: Strength is normal for age in both the upper and lower extremities. Muscle tone is normal. Sensation to touch is normal in both legs.    LAB DATA:   Labs 05/14/21: HbA1c 5.1%, CBG 102  Labs 05/13/21: TSH 1.26, free T4 1.1, free T3 2.6; CBC normal, except platelets 109 (ref 140-400); RBC number, Hgb, and HCT are normal, but low-normal; iron 80; 25-OH vitamin D 23  Labs 02/24/21: HbA1c 5.1%; TSH 3.53, free T4 1.0, free T3 2.3; CBC normal, except RBC 3.79 (ref 3.80-5.10), Hgb 11.4 (ref 11.7-15.5), and Hct 34.2 (ref 35-45), and platelets 124 (ref 140-400); iron 96 (ref 40-190)  Labs 12/23/20: HbA1c 5.4%; TSH 2.30, free T4 1.0, free T3 2.3; CBC normal, except RDW 15.5 (ref 11-15) and platelets 133 (ref 140-400); iron 70 (ref 40-190)  Labs 08/07/19: CBC normal; iron 67 (ref 40-190)  Labs 04/10/19: HbA1c 5.6%; TSH 0.51, free T4 1.3, free T3 3.2; CBC normal, except Hgb 9.5 (ref 11.7-15.5), Hct 29.8 (ref 35-45), MCV 74.1 (ref 80-100), MCH 23.6 (ref 27-33), MCHC 31.9 (ref 32-36); iron 24 (ref 40-190)  Labs 08/23/18: TSH 8.83  Labs 01/06/18: TSH 0.83, free T4 1.4, free T3 3.1  Labs 1205/18: TSH 0.05, free T4 1.3, free T3 3.4  Labs 06/17/16: HbA1c 5.4%, CBG 103  Labs 06/15/16: TSH 0.26, free T4 1.2, free T3 2.3  Labs 05/07/16: TSH 0.47, free T4 1.1, free T3 2.5  8//28/17: TSH 3.54, free T4 1.2, free T3 2.2; HbA1c 5.4%  02/04/16: CBG 92 after breakfast, HbA1c 5.4%  02/03/16: TSH 5.85, free T4 1.0, free T3 1.8  12/04/15: TSH 3.52, free T4 1.1, free T3 2.2; iron 51; HbA1c 5.3%  11/28/15: TSH 2.330; CBC normal except for MCH of 26.7 (normal 27-33)  11/18/15: Positive pregnancy test  04/15/15: HbA1c 5.7%; Hgb 11.7, Hct 35.7, iron 36 (normal 40-190); TSH 1.151, free T4 1.08, free T3  2.8  01/10/15: HbA1c 5.9%; Hgb 11.9, Hct 36.3, iron 43; TSH 2.853, free T4 0.83, free T3 2.5  10/15/14: Hgb 11.3, Hct 35.6, MCH 25.6,Iron 24; TSH 2.549, free T4 0.99, free T3 2.4  04/13/14: Iron 24; Hgb 11.1, Hct 32.6%, MCV 74.9; TSH 0.122, free T4 1.45, free T3 3.3  08/04/12: TSH 0.824, free T4 1.26, free T3 3.4  02/01/13: Iron 84, Hgb 11.8, Hct 34.8%  01/30/13; TSH 1.397, free T4 1.44, free T3 2.8  08/02/12: TSH 3.078, free T4 1.14, free T3 2.7  12/15/11: TSH was 0.520, free T4 1.40, free T3  2.7   Assessment and Plan:   ASSESSMENT: 1. Hypothyroid/hyperthyroid:   A. During the past 16 years, Aspynn TFTs have varied over time with her pregnancies, other health conditions, and levothyroxine/Synthroid doses. We have tried to keep her TSH in the ideal goal range of 1.0-2.0.   B. Her TFTs in September 2020 were at the upper end of the reference range. Her TFTs in June 2022 were normal, but at about the 25% of the physiologic range. Her TFTs in August 2022 were lower, so I increased her Synthroid dose. It appears that her placenta was excessively metabolizing her Synthroid.  C. Her TFTs in October 2022 were mid-normal.  D. We will repeat her TFTs every two months. We will adjust her Synthroid doses to try to keep her TSH in the goal range of 1.0-2.0.  2. Thyroiditis: Her Hashimoto's disease is clinically quiescent today.  3. Goiter: Her thyroid gland is  a bit less enlarged today. The process of waxing and waning of thyroid gland size is c/w evolving thyroiditis. 4. Exophthalmos: The eyes are within normal limits in terms of prominence again today,. I do not see any restriction of eye movement today. She does have a slight sense of pressure behind the right eye with upward and rightward gaze.  5. Overweight: Her weight has increased too much.  She needs to continue to Eat Right and to walk an hour a day or perform equivalent exercise.  6-9. Fatigue/pallor/coldness/iron deficiency anemia:   A. Her  pallor and iron deficiency anemia had resolved after resuming iron treatment. Unfortunately, In August 2022 she was anemic again, even with a normal iron content.   B. Her RBC indices are better in October 2022, but only low-normal. She needs to continue to take her iron.    10. Hypotension: Her BP is good. Her dizziness has resolved.  11. Pre-diabetes: Her HbA1c was in the prediabetic range in June and September 2016, was 5.3% at her June 2017 visit, but had decreased to 5.3% in May 2017, then increased to 5.4% at her visits in July, August, and November 2017. Her HbA1c in September 2020 had increased to 5.6%, but had decreased to 5.4% in June 2022. Her HbA1c in August 2022 and in October 2022 was mid-normal at 5.1%.    12. Vitamin D deficiency: She needs to take Biotech, one 50,000 IU capsule weekly.  13. Hypothyroid, pregnancy, second semester: As above  PLAN: 1. Diagnostic: Repeat her TFTs, 25-OH vitamin D, CBC, iron, and HbA1c in two months.  2. Therapeutic: Take Synthroid doses of 2 of the 112 mcg tablets per day on even-numbered days and 1.5 tablets per day on odd-numbered days. Take MVI about dinner time. Take one Biotech 50,000 IU per week.  3. Patient education: We discussed her lab results and the need to follow her closely.  4. Follow-up: 2 months  Level of Service: This visit lasted in excess of 65 minutes. More than 50% of the visit was devoted to counseling.  Molli Knock, MD, CDE Adult and Pediatric Endocrinology

## 2021-05-14 ENCOUNTER — Other Ambulatory Visit: Payer: Self-pay

## 2021-05-14 ENCOUNTER — Ambulatory Visit (INDEPENDENT_AMBULATORY_CARE_PROVIDER_SITE_OTHER): Payer: Medicaid Other | Admitting: "Endocrinology

## 2021-05-14 ENCOUNTER — Encounter (INDEPENDENT_AMBULATORY_CARE_PROVIDER_SITE_OTHER): Payer: Self-pay | Admitting: "Endocrinology

## 2021-05-14 VITALS — BP 114/72 | HR 74 | Wt 191.8 lb

## 2021-05-14 DIAGNOSIS — E039 Hypothyroidism, unspecified: Secondary | ICD-10-CM

## 2021-05-14 DIAGNOSIS — O99282 Endocrine, nutritional and metabolic diseases complicating pregnancy, second trimester: Secondary | ICD-10-CM

## 2021-05-14 DIAGNOSIS — E049 Nontoxic goiter, unspecified: Secondary | ICD-10-CM | POA: Diagnosis not present

## 2021-05-14 DIAGNOSIS — R7303 Prediabetes: Secondary | ICD-10-CM | POA: Diagnosis not present

## 2021-05-14 DIAGNOSIS — H0520 Unspecified exophthalmos: Secondary | ICD-10-CM

## 2021-05-14 DIAGNOSIS — E063 Autoimmune thyroiditis: Secondary | ICD-10-CM

## 2021-05-14 DIAGNOSIS — E349 Endocrine disorder, unspecified: Secondary | ICD-10-CM

## 2021-05-14 DIAGNOSIS — D500 Iron deficiency anemia secondary to blood loss (chronic): Secondary | ICD-10-CM

## 2021-05-14 DIAGNOSIS — E559 Vitamin D deficiency, unspecified: Secondary | ICD-10-CM

## 2021-05-14 LAB — CBC WITH DIFFERENTIAL/PLATELET
Absolute Monocytes: 389 cells/uL (ref 200–950)
Basophils Absolute: 12 cells/uL (ref 0–200)
Basophils Relative: 0.2 %
Eosinophils Absolute: 41 cells/uL (ref 15–500)
Eosinophils Relative: 0.7 %
HCT: 35.4 % (ref 35.0–45.0)
Hemoglobin: 11.7 g/dL (ref 11.7–15.5)
Lymphs Abs: 1333 cells/uL (ref 850–3900)
MCH: 30.7 pg (ref 27.0–33.0)
MCHC: 33.1 g/dL (ref 32.0–36.0)
MCV: 92.9 fL (ref 80.0–100.0)
MPV: 11.9 fL (ref 7.5–12.5)
Monocytes Relative: 6.6 %
Neutro Abs: 4124 cells/uL (ref 1500–7800)
Neutrophils Relative %: 69.9 %
Platelets: 109 10*3/uL — ABNORMAL LOW (ref 140–400)
RBC: 3.81 10*6/uL (ref 3.80–5.10)
RDW: 12.8 % (ref 11.0–15.0)
Total Lymphocyte: 22.6 %
WBC: 5.9 10*3/uL (ref 3.8–10.8)

## 2021-05-14 LAB — POCT GLYCOSYLATED HEMOGLOBIN (HGB A1C): Hemoglobin A1C: 5.1 % (ref 4.0–5.6)

## 2021-05-14 LAB — POCT GLUCOSE (DEVICE FOR HOME USE): POC Glucose: 102 mg/dl — AB (ref 70–99)

## 2021-05-14 LAB — IRON: Iron: 80 ug/dL (ref 40–190)

## 2021-05-14 LAB — T4, FREE: Free T4: 1.1 ng/dL (ref 0.8–1.8)

## 2021-05-14 LAB — TSH: TSH: 1.26 mIU/L

## 2021-05-14 LAB — T3, FREE: T3, Free: 2.6 pg/mL (ref 2.3–4.2)

## 2021-05-14 LAB — VITAMIN D 25 HYDROXY (VIT D DEFICIENCY, FRACTURES): Vit D, 25-Hydroxy: 23 ng/mL — ABNORMAL LOW (ref 30–100)

## 2021-05-14 NOTE — Patient Instructions (Signed)
Follow up visit in mid-December. Please repeat lab tests about one week prior. Take one Biotech 50,000 IU capsule as soon as you receive it, one capsule 5 days later, and thein one capsule per week.

## 2021-07-02 LAB — OB RESULTS CONSOLE GBS: GBS: NEGATIVE

## 2021-07-10 ENCOUNTER — Observation Stay (HOSPITAL_COMMUNITY)
Admission: AD | Admit: 2021-07-10 | Payer: Medicaid Other | Source: Home / Self Care | Admitting: Obstetrics and Gynecology

## 2021-07-10 ENCOUNTER — Observation Stay (HOSPITAL_COMMUNITY): Payer: Medicaid Other

## 2021-07-11 ENCOUNTER — Ambulatory Visit (INDEPENDENT_AMBULATORY_CARE_PROVIDER_SITE_OTHER): Payer: Medicaid Other | Admitting: "Endocrinology

## 2021-07-12 ENCOUNTER — Other Ambulatory Visit: Payer: Self-pay | Admitting: Obstetrics and Gynecology

## 2021-07-16 NOTE — Progress Notes (Signed)
Subjective:  Patient Name: Grace Wall Date of Birth: Jun 07, 1986  MRN: [id number]  Grace Wall  presents to the office today for follow-up of her diffuse toxic goiter Luiz Blare' disease), hypothyroidism due to Hashimoto's disease, exophthalmos, iron deficiency anemia, obesity, and third pregnancy.  HISTORY OF PRESENT ILLNESS:   Grace Wall is a 35 y.o. [place] young woman. Grace Wall was unaccompanied.   1. The patient was first referred to me by herself on 05/08/05.   A. She was diagnosed with thyroid problems at age 54. At that point she was hyperthyroid, had a big anterior neck, and had big eyes, so she presumably had Graves' disease. She was placed on Tapazole (methimazole). Tapazole was subsequently stopped at about age 28.  B. About one year later, she became hypothyroid, presumably due to coexistent Hashimoto's disease. She started Synthroid. The Synthroid was changed to generic levothyroxine in 2005. The patient's eyes had gradually improved over time. At that first visit with me, she was feeling fairly well, but was tired a lot. She was also somewhat mentally confused at times.   C. On physical examination she had intermittent proptosis bilaterally. The thyroid gland was 20+ grams in size. The right lobe was within normal limits, but the left lobe was slightly enlarged. Laboratory data showed a TSH of 3.09, free T4 1.22, and free T3 of 2.6. Her TPO antibody level was 1171.1, with normals being less than 60. A thyroid-stimulating immunoglobulin level was 64, with normals being <129. The thyroid binding inhibitory immunoglobulin was 96, with normals being less than 17. Her TSH receptor antibody was 87, with normals being less than 10. 2. Given the high levels of the thyroid receptor antibody, it was unclear how her thyroid tests would evolve. Repeat thyroid hormone tests performed on 05/18/2005 were markedly different. At this time the TSH was 0.032,  free T4 was 1.77, and free T3 was 3.9. At that  point it was still unclear whether the fluctuations in thyroid hormone were due to changes in the forms of levothyroxine made by different companies, or to  changes in thyroid receptor antibody levels, or both. I changed her to brand Synthroid at a dose of 150 mcg per day.   2. During the past 16 years we have followed her for several problems:  A. We've had to adjust her thyroid hormone doses frequently. Her TSH values have ranged from 0.04 - 8.58. Part of these fluctuations have been due to flare ups of Hashimoto's disease. In addition, because she has not had any health insurance during most of this time, she's often had to rely on thyroid hormone samples. Sometimes she's not always had exactly the right thyroid hormone doses. She also appears to have had fluctuations in her thyroid receptor antibodies which have caused changes in her thyroid hormone levels.  Because of her lack of insurance, however, we had not been able to repeat her thyroid receptor antibody testing.  B. She has also had a problem with chronic iron deficiency anemia.   C. She has been obese/overweight.   D. In June 2016 her HbA1c was 5.9%. In September 2016 her HbA1c was 5.7%. The four HbA1c tests performed in 2017 were normal, varying from 5.3-5.4%. Due to her lack of insurance, we have not been able to perform this testing as often as I would have preferred. Since 2019 her HbA1c values have varied from 5.1-5.6%. E. She is now pregnant. Her EDC is in January 7th, 2023.  3. The patient's last [facility] visit was  on 05/14/21. At that visit I continued her Synthroid doses of two of the 112 mcg tablets/day on even-numbered days and 1.5 tablets/day on odd-numbered days.   A. In the interim, she has been healthy. She is almost 9 months pregnant. Her EDC is 07/26/21. She is due for an induction on 07/25/21 if needed. She is not sleeping well, c/w this point in her pregnancy. Her emotions have been good. Her energy is pretty good overall.    B.  She is not usually taking any other medications, vitamins, or minerals in the mornings when she takes her Synthroid about 7 AM. She is taking her prenatal vitamin with iron in the evening about 8 PM. She takes her weekly vitamin D dose as planned. She has missed several doses of her medications recently.   C. She has been eating more. She has not been exercising much.   D. She has not been having much trapezius and nuchal tightness and soreness, posterior headaches, and band-like headaches. The tingling numbness in both hands has resolved.                         4. Pertinent Review of Systems:  Constitutional: The patient feels "good, but tired, and ready to have this baby". Her energy level is "pretty good". Her body temperature is normal.  Eyes: Vision is pretty good, but she sometimes needs her glasses for distance reading. There are no other significant eye complaints. She does not think that one eye is more prominent than the other. When she moves her eyes around she no longer has a sensation of pressure or restriction.  Neck: The patient has no complaints of anterior neck swelling, soreness, tenderness,  pressure, discomfort, or difficulty swallowing.  Heart: Heart rate increases with exercise or other physical activity. The patient has no complaints of palpitations, irregular heat beats, chest pain, or chest pressure. Gastrointestinal: She is no longer constipated. She no longer has reflux and nausea.  Hands: No problems Legs: Muscle mass and strength seem normal. There are no complaints of numbness, tingling, burning, or pain. She has not had swelling recently.   Feet: There are no obvious foot problems. There are no complaints of numbness, tingling, burning, or pain. No edema is noted. GYN: LMP was 10/19/20. Periods occurred regularly. EDC is January 7th 2023.    PAST MEDICAL, FAMILY, AND SOCIAL HISTORY:  Past Medical History:  Diagnosis Date   Exophthalmos    Hypothyroidism, acquired,  autoimmune    Supervision of low-risk pregnancy 11/28/2015    Clinic  [facility] [place] Prenatal Labs Dating  LMP Blood type: O/Positive/-- (05/11 1044)  Genetic Screen 1 Screen: negative   AFP:     Quad:     NIPS: Antibody:Negative (05/11 1044) Anatomic [place]  normal female fetus Rubella: 30.10 (05/11 1044) GTT Third trimester: normal RPR: Non Reactive (10/11 1443)  Flu vaccine  05/11/16 HBsAg: Negative (05/11 1044)  TDaP vaccine   05/11/16                              HIV: Non Reactive (10/11 1443)  Baby Food  breast                          GBS:  Contraception undecided Pap: Negative (08/29/2015) Circumcision N/a female  Pediatrician  [facility]  Support Person husband      Thyroiditis,  autoimmune     Family History  Problem Relation Age of Onset   Cancer Maternal Aunt    Thyroid disease Son    Diabetes Mother    Hypertension Father      Current Outpatient Medications:    ferrous sulfate (FERROUSUL) 325 (65 FE) MG tablet, Take 1 pill daily, Disp: 90 tablet, Rfl: 3   Prenatal Vit-Fe Fumarate-FA (PRENATAL MULTIVITAMIN) TABS tablet, Take 1 tablet by mouth daily at 12 noon., Disp: , Rfl:    SYNTHROID 112 MCG tablet, Take two 112 mcg tablets per day on even-numbered days and 1.5 tablets on odd-numbered days., Disp: 45 tablet, Rfl: 6   ibuprofen (ADVIL,MOTRIN) 600 MG tablet, Take 600 mg by mouth every 6 (six) hours as needed. (Patient not taking: Reported on 12/24/2020), Disp: , Rfl:    promethazine (PHENERGAN) 25 MG tablet, Take 25 mg by mouth every 6 (six) hours as needed. (Patient not taking: Reported on 05/14/2021), Disp: , Rfl:   Allergies as of 07/17/2021   (No Known Allergies)    1. 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 She now has Medicaid. Her mother now lives in the area.  2. Activities: She stays busy with child care.  3. Smoking, alcohol, or drugs: None 4. Primary Care Provider; Horton Marshall, MD, [facility], office [phone] 5. OB:  [facility]  REVIEW OF SYSTEMS: There are no other significant problems involving the patient's other body systems.   Objective:  Vital Signs:  BP 118/72 (BP Location: Right Arm, Patient Position: Sitting, Cuff Size: Normal)    Pulse 92    Wt 197 lb 12.8 oz (89.7 kg)    BMI 32.17 kg/m   Wt Readings from Last 3 Encounters:  07/17/21 197 lb 12.8 oz (89.7 kg)  05/14/21 191 lb 12.8 oz (87 kg)  02/26/21 175 lb 12.8 oz (79.7 kg)   PHYSICAL EXAM: Constitutional: The patient appears healthy, but very pregnant and overweight. Her weight has increased 6 pounds in 2 months. She is alert, oriented, and looks good. She has normal affect and insight today.  Eyes:  There is no obvious arcus. Her right eye is very slightly more prominent than the left, but neither eye appears unusually prominent. She has no inferior proptosis today,  Extraocular movements of her eyes are normal. Upward and lateral movements of the eyes are no longer restricted. She feels no sense of eye muscle pressure when looking upward and to the right or upward and to the left. Moisture appears normal. Mouth: The oropharynx and tongue appear normal. Oral moisture is normal. There is no hyperpigmentation.  Neck: The neck appears to be visibly normal. No carotid bruits are noted. The thyroid gland is mildly enlarged at about 21 grams. Today both lobes are symmetrically enlarged. The consistency of the thyroid gland is somewhat full. The thyroid gland is not tender to palpation.  Lungs: The lungs are clear to auscultation. Air movement is good. Heart: Heart rate and rhythm are regular. Heart sounds S1 and S2 are normal. I did not appreciate any pathologic cardiac murmurs or heart sounds. Abdomen: The abdomen is more enlarged. Bowel sounds are normal. There is no obvious hepatomegaly, splenomegaly, or other mass effect.  Arms: Muscle size and bulk are normal for age.  Hands: There is no tremor. Phalangeal and metacarpophalangeal  joints are normal. Palmar muscles are normal for age. Palmar skin shows no palmar erythema. Palmar moisture is normal. There is no  hyperpigmentation. I do not see any nail pallor today.   Legs: Muscles appear normal for age. No edema is present. Neurologic: Strength is normal for age in both the upper and lower extremities. Muscle tone is normal. Sensation to touch is normal in both legs.    LAB DATA:   Lbs 07/17/21: HbA1c 5.4%, CBG 138  Labs 07/16/21; TSH 3.34, free T4 0.9, free T3 1.9; CMP normal, except albumin 3.5 (ref 3.6-5.1); CBC normal, except platelets 100 (ref 140-400); iron 85 (ref 40-190); PTH pending, calcium 9.1, 25-OH vitamin D 22  Labs 05/14/21: HbA1c 5.1%, CBG 102  Labs 05/13/21: TSH 1.26, free T4 1.1, free T3 2.6; CBC normal, except platelets 109 (ref 140-400); RBC number, Hgb, and HCT are normal, but low-normal; iron 80; 25-OH vitamin D 23  Labs 02/24/21: HbA1c 5.1%; TSH 3.53, free T4 1.0, free T3 2.3; CBC normal, except RBC 3.79 (ref 3.80-5.10), Hgb 11.4 (ref 11.7-15.5), and Hct 34.2 (ref 35-45), and platelets 124 (ref 140-400); iron 96 (ref 40-190)  Labs 12/23/20: HbA1c 5.4%; TSH 2.30, free T4 1.0, free T3 2.3; CBC normal, except RDW 15.5 (ref 11-15) and platelets 133 (ref 140-400); iron 70 (ref 40-190)  Labs 08/07/19: CBC normal; iron 67 (ref 40-190)  Labs 04/10/19: HbA1c 5.6%; TSH 0.51, free T4 1.3, free T3 3.2; CBC normal, except Hgb 9.5 (ref 11.7-15.5), Hct 29.8 (ref 35-45), MCV 74.1 (ref 80-100), MCH 23.6 (ref 27-33), MCHC 31.9 (ref 32-36); iron 24 (ref 40-190)  Labs 08/23/18: TSH 8.83  Labs 01/06/18: TSH 0.83, free T4 1.4, free T3 3.1  Labs 1205/18: TSH 0.05, free T4 1.3, free T3 3.4  Labs 06/17/16: HbA1c 5.4%, CBG 103  Labs 06/15/16: TSH 0.26, free T4 1.2, free T3 2.3  Labs 05/07/16: TSH 0.47, free T4 1.1, free T3 2.5  8//28/17: TSH 3.54, free T4 1.2, free T3 2.2; HbA1c 5.4%  02/04/16: CBG 92 after breakfast, HbA1c 5.4%  02/03/16: TSH 5.85, free T4 1.0, free  T3 1.8  12/04/15: TSH 3.52, free T4 1.1, free T3 2.2; iron 51; HbA1c 5.3%  11/28/15: TSH 2.330; CBC normal except for MCH of 26.7 (normal 27-33)  11/18/15: Positive pregnancy test  04/15/15: HbA1c 5.7%; Hgb 11.7, Hct 35.7, iron 36 (normal 40-190); TSH 1.151, free T4 1.08, free T3 2.8  01/10/15: HbA1c 5.9%; Hgb 11.9, Hct 36.3, iron 43; TSH 2.853, free T4 0.83, free T3 2.5  10/15/14: Hgb 11.3, Hct 35.6, MCH 25.6,Iron 24; TSH 2.549, free T4 0.99, free T3 2.4  04/13/14: Iron 24; Hgb 11.1, Hct 32.6%, MCV 74.9; TSH 0.122, free T4 1.45, free T3 3.3  08/04/12: TSH 0.824, free T4 1.26, free T3 3.4  02/01/13: Iron 84, Hgb 11.8, Hct 34.8%  01/30/13; TSH 1.397, free T4 1.44, free T3 2.8  08/02/12: TSH 3.078, free T4 1.14, free T3 2.7  12/15/11: TSH was 0.520, free T4 1.40, free T3  2.7   Assessment and Plan:   ASSESSMENT: 1. Hypothyroid/hyperthyroid:   A. During the past 16 years, Liliann TFTs have varied over time with her pregnancies, other health conditions, and levothyroxine/Synthroid doses. We have tried to keep her TSH in the ideal goal range of 1.0-2.0.   B. Her TFTs in September 2020 were at the upper end of the reference range. Her TFTs in June 2022 were normal, but at about the 25% of the physiologic range. Her TFTs in August 2022 were lower, so I increased her Synthroid dose. It appears that her placenta was excessively metabolizing her Synthroid.  C. Her  TFTs in October 2022 were mid-normal.  D. Her TFTs in December 2022 were borderline low. This decrease in TFTs is due in part to her placenta breaking down thyroid hormone, but also due in part to missing some doses recently. 2. Thyroiditis: Her Hashimoto's disease is clinically quiescent today.  3. Goiter: Her thyroid gland is enlarged again today and the lobes have shifted in size again today. The process of waxing and waning of thyroid gland size is c/w evolving thyroiditis. 4. Exophthalmos: The eyes are within normal limits in terms of  prominence again today,. I do not see any restriction of eye movement today. She does not have any sense of pressure behind the either eye with upward and rightward gaze.  5. Overweight: Her weight has increased too much.  She needs to continue to Eat Right and to walk an hour a day or perform equivalent exercise.  6-9. Fatigue/pallor/coldness/iron deficiency anemia:   A. Her pallor and iron deficiency anemia had resolved after resuming iron treatment. Unfortunately, In August 2022 she was anemic again, even with a normal iron content.   B. Her RBC indices were better in October and December 2022. Her iron was also good in December. She needs to continue to take her iron.    10. Hypotension: Her BP is good. Her dizziness has resolved.  11. Pre-diabetes: Her HbA1c was in the prediabetic range in June and September 2016, was 5.3% at her June 2017 visit, but had decreased to 5.3% in May 2017, then increased to 5.4% at her visits in July, August, and November 2017. Her HbA1c in September 2020 had increased to 5.6%, but had decreased to 5.4% in June 2022. Her HbA1c in August 2022 and in October 2022 was mid-normal at 5.1%.  Her HbA1c in December has increased to 5.4%, c/w the insulin resistance that occurs in the third trimester of pregnancy.   12. Vitamin D deficiency: She is still deficient in vitamin D, In part due to placental transfer of her calcium and vitamin D to the fetus. She needs to take Biotech, one 50,000 IU capsule, twice weekly for the next two weeks.   13. Hypothyroid, pregnancy, third semester: As above  PLAN: 1. Diagnostic: Repeat her TFTs, 25-OH vitamin D, CBC, iron, and HbA1c in 3 months.  2. Therapeutic: Take Synthroid doses of 2 of the 112 mcg tablets per day on even-numbered days and 1.5 tablets per day on odd-numbered days, but double up on doses for the next three days. Take MVI about dinner time. Take one Biotech 50,000 IU per week, except twice weekly for the next two weeks.   3.  Patient education: We discussed her lab results and the need to follow her closely.  4. Follow-up: 3 months  Level of Service: This visit lasted in excess of 75 minutes. More than 50% of the visit was devoted to counseling.  Molli Knock, MD, CDE Adult and Pediatric Endocrinology

## 2021-07-17 ENCOUNTER — Encounter (HOSPITAL_COMMUNITY): Payer: Self-pay

## 2021-07-17 ENCOUNTER — Ambulatory Visit (INDEPENDENT_AMBULATORY_CARE_PROVIDER_SITE_OTHER): Payer: Medicaid Other | Admitting: "Endocrinology

## 2021-07-17 ENCOUNTER — Telehealth (HOSPITAL_COMMUNITY): Payer: Self-pay | Admitting: *Deleted

## 2021-07-17 ENCOUNTER — Encounter (INDEPENDENT_AMBULATORY_CARE_PROVIDER_SITE_OTHER): Payer: Self-pay | Admitting: "Endocrinology

## 2021-07-17 ENCOUNTER — Encounter (HOSPITAL_COMMUNITY): Payer: Self-pay | Admitting: *Deleted

## 2021-07-17 ENCOUNTER — Other Ambulatory Visit: Payer: Self-pay

## 2021-07-17 VITALS — BP 118/72 | HR 92 | Wt 197.8 lb

## 2021-07-17 DIAGNOSIS — R5383 Other fatigue: Secondary | ICD-10-CM

## 2021-07-17 DIAGNOSIS — E559 Vitamin D deficiency, unspecified: Secondary | ICD-10-CM

## 2021-07-17 DIAGNOSIS — R7303 Prediabetes: Secondary | ICD-10-CM | POA: Diagnosis not present

## 2021-07-17 DIAGNOSIS — O99283 Endocrine, nutritional and metabolic diseases complicating pregnancy, third trimester: Secondary | ICD-10-CM

## 2021-07-17 DIAGNOSIS — E0500 Thyrotoxicosis with diffuse goiter without thyrotoxic crisis or storm: Secondary | ICD-10-CM

## 2021-07-17 DIAGNOSIS — E063 Autoimmune thyroiditis: Secondary | ICD-10-CM | POA: Diagnosis not present

## 2021-07-17 DIAGNOSIS — E349 Endocrine disorder, unspecified: Secondary | ICD-10-CM

## 2021-07-17 DIAGNOSIS — E039 Hypothyroidism, unspecified: Secondary | ICD-10-CM | POA: Diagnosis not present

## 2021-07-17 DIAGNOSIS — H0520 Unspecified exophthalmos: Secondary | ICD-10-CM

## 2021-07-17 DIAGNOSIS — E663 Overweight: Secondary | ICD-10-CM

## 2021-07-17 DIAGNOSIS — E049 Nontoxic goiter, unspecified: Secondary | ICD-10-CM

## 2021-07-17 DIAGNOSIS — R231 Pallor: Secondary | ICD-10-CM

## 2021-07-17 LAB — POCT GLYCOSYLATED HEMOGLOBIN (HGB A1C): Hemoglobin A1C: 5.4 % (ref 4.0–5.6)

## 2021-07-17 LAB — CBC WITH DIFFERENTIAL/PLATELET
Absolute Monocytes: 342 cells/uL (ref 200–950)
Basophils Absolute: 12 cells/uL (ref 0–200)
Basophils Relative: 0.2 %
Eosinophils Absolute: 12 cells/uL — ABNORMAL LOW (ref 15–500)
Eosinophils Relative: 0.2 %
HCT: 36.6 % (ref 35.0–45.0)
Hemoglobin: 12.6 g/dL (ref 11.7–15.5)
Lymphs Abs: 1299 cells/uL (ref 850–3900)
MCH: 31.6 pg (ref 27.0–33.0)
MCHC: 34.4 g/dL (ref 32.0–36.0)
MCV: 91.7 fL (ref 80.0–100.0)
MPV: 12.3 fL (ref 7.5–12.5)
Monocytes Relative: 5.9 %
Neutro Abs: 4135 cells/uL (ref 1500–7800)
Neutrophils Relative %: 71.3 %
Platelets: 100 10*3/uL — ABNORMAL LOW (ref 140–400)
RBC: 3.99 10*6/uL (ref 3.80–5.10)
RDW: 12.9 % (ref 11.0–15.0)
Total Lymphocyte: 22.4 %
WBC: 5.8 10*3/uL (ref 3.8–10.8)

## 2021-07-17 LAB — POCT GLUCOSE (DEVICE FOR HOME USE): POC Glucose: 138 mg/dl — AB (ref 70–99)

## 2021-07-17 LAB — COMPREHENSIVE METABOLIC PANEL
AG Ratio: 1.3 (calc) (ref 1.0–2.5)
ALT: 15 U/L (ref 6–29)
AST: 19 U/L (ref 10–30)
Albumin: 3.5 g/dL — ABNORMAL LOW (ref 3.6–5.1)
Alkaline phosphatase (APISO): 77 U/L (ref 31–125)
BUN: 13 mg/dL (ref 7–25)
CO2: 22 mmol/L (ref 20–32)
Calcium: 9.1 mg/dL (ref 8.6–10.2)
Chloride: 104 mmol/L (ref 98–110)
Creat: 0.77 mg/dL (ref 0.50–0.97)
Globulin: 2.8 g/dL (calc) (ref 1.9–3.7)
Glucose, Bld: 95 mg/dL (ref 65–139)
Potassium: 4 mmol/L (ref 3.5–5.3)
Sodium: 136 mmol/L (ref 135–146)
Total Bilirubin: 0.4 mg/dL (ref 0.2–1.2)
Total Protein: 6.3 g/dL (ref 6.1–8.1)

## 2021-07-17 LAB — PTH, INTACT AND CALCIUM
Calcium: 9.1 mg/dL (ref 8.6–10.2)
PTH: 40 pg/mL (ref 16–77)

## 2021-07-17 LAB — VITAMIN D 25 HYDROXY (VIT D DEFICIENCY, FRACTURES): Vit D, 25-Hydroxy: 22 ng/mL — ABNORMAL LOW (ref 30–100)

## 2021-07-17 LAB — T4, FREE: Free T4: 0.9 ng/dL (ref 0.8–1.8)

## 2021-07-17 LAB — T3, FREE: T3, Free: 1.9 pg/mL — ABNORMAL LOW (ref 2.3–4.2)

## 2021-07-17 LAB — TSH: TSH: 3.34 mIU/L

## 2021-07-17 LAB — IRON: Iron: 85 ug/dL (ref 40–190)

## 2021-07-17 NOTE — Telephone Encounter (Signed)
Preadmission screen  

## 2021-07-17 NOTE — Patient Instructions (Addendum)
Follow up visit in 3 months. Please take the vitamin D twice weekly for the next two weeks or until you deliver. Please double up on thyroid hormone does for the next three days.

## 2021-07-21 ENCOUNTER — Encounter (HOSPITAL_COMMUNITY): Payer: Self-pay | Admitting: Obstetrics and Gynecology

## 2021-07-23 ENCOUNTER — Other Ambulatory Visit: Payer: Self-pay | Admitting: Obstetrics and Gynecology

## 2021-07-24 LAB — SARS CORONAVIRUS 2 (TAT 6-24 HRS): SARS Coronavirus 2: NEGATIVE

## 2021-07-25 ENCOUNTER — Inpatient Hospital Stay (HOSPITAL_COMMUNITY)
Admission: AD | Admit: 2021-07-25 | Discharge: 2021-07-27 | DRG: 787 | Disposition: A | Discharge status: Home / Self Care | Payer: Medicaid Other | Attending: Obstetrics and Gynecology | Admitting: Obstetrics and Gynecology

## 2021-07-25 ENCOUNTER — Other Ambulatory Visit: Payer: Self-pay

## 2021-07-25 ENCOUNTER — Encounter (HOSPITAL_COMMUNITY): Payer: Self-pay | Admitting: Obstetrics and Gynecology

## 2021-07-25 ENCOUNTER — Inpatient Hospital Stay (HOSPITAL_COMMUNITY): Payer: Medicaid Other

## 2021-07-25 ENCOUNTER — Encounter (HOSPITAL_COMMUNITY)
Admission: AD | Disposition: A | Discharge status: Home / Self Care | Payer: Self-pay | Source: Home / Self Care | Attending: Obstetrics and Gynecology

## 2021-07-25 ENCOUNTER — Inpatient Hospital Stay (HOSPITAL_COMMUNITY): Payer: Medicaid Other | Admitting: Anesthesiology

## 2021-07-25 DIAGNOSIS — Z3A39 39 weeks gestation of pregnancy: Secondary | ICD-10-CM

## 2021-07-25 DIAGNOSIS — O9912 Other diseases of the blood and blood-forming organs and certain disorders involving the immune mechanism complicating childbirth: Secondary | ICD-10-CM | POA: Diagnosis present

## 2021-07-25 DIAGNOSIS — D6959 Other secondary thrombocytopenia: Secondary | ICD-10-CM | POA: Diagnosis present

## 2021-07-25 DIAGNOSIS — O99284 Endocrine, nutritional and metabolic diseases complicating childbirth: Secondary | ICD-10-CM | POA: Diagnosis present

## 2021-07-25 DIAGNOSIS — E039 Hypothyroidism, unspecified: Secondary | ICD-10-CM | POA: Diagnosis present

## 2021-07-25 DIAGNOSIS — O26893 Other specified pregnancy related conditions, third trimester: Secondary | ICD-10-CM | POA: Diagnosis present

## 2021-07-25 DIAGNOSIS — O09523 Supervision of elderly multigravida, third trimester: Secondary | ICD-10-CM | POA: Diagnosis present

## 2021-07-25 HISTORY — DX: Thrombocytopenia, unspecified: D69.6

## 2021-07-25 LAB — CBC
HCT: 36 % (ref 36.0–46.0)
Hemoglobin: 12.4 g/dL (ref 12.0–15.0)
MCH: 31.9 pg (ref 26.0–34.0)
MCHC: 34.4 g/dL (ref 30.0–36.0)
MCV: 92.5 fL (ref 80.0–100.0)
Platelets: 104 10*3/uL — ABNORMAL LOW (ref 150–400)
RBC: 3.89 MIL/uL (ref 3.87–5.11)
RDW: 13.3 % (ref 11.5–15.5)
WBC: 6.3 10*3/uL (ref 4.0–10.5)
nRBC: 0 % (ref 0.0–0.2)

## 2021-07-25 LAB — RPR: RPR Ser Ql: NONREACTIVE

## 2021-07-25 LAB — TYPE AND SCREEN
ABO/RH(D): O POS
Antibody Screen: NEGATIVE

## 2021-07-25 SURGERY — Surgical Case
Anesthesia: Epidural

## 2021-07-25 MED ORDER — SCOPOLAMINE 1 MG/3DAYS TD PT72
1.0000 | MEDICATED_PATCH | Freq: Once | TRANSDERMAL | Status: DC
  Administered 2021-07-25: 1.5 mg via TRANSDERMAL

## 2021-07-25 MED ORDER — EPHEDRINE 5 MG/ML INJ: 10.0000 mg | INTRAVENOUS | Status: DC | PRN

## 2021-07-25 MED ORDER — ONDANSETRON HCL 4 MG/2ML IJ SOLN
INTRAMUSCULAR | Status: AC
  Filled 2021-07-25: qty 4

## 2021-07-25 MED ORDER — OXYTOCIN-SODIUM CHLORIDE 30-0.9 UT/500ML-% IV SOLN: 2.5000 [IU]/h | INTRAVENOUS | Status: DC

## 2021-07-25 MED ORDER — OXYTOCIN-SODIUM CHLORIDE 30-0.9 UT/500ML-% IV SOLN
INTRAVENOUS | Status: DC | PRN
  Administered 2021-07-25: 200 mL via INTRAVENOUS

## 2021-07-25 MED ORDER — IBUPROFEN 600 MG PO TABS
600.0000 mg | ORAL_TABLET | Freq: Four times a day (QID) | ORAL | Status: DC
  Administered 2021-07-25: 600 mg via ORAL
  Filled 2021-07-25: qty 1

## 2021-07-25 MED ORDER — DIPHENHYDRAMINE HCL 50 MG/ML IJ SOLN
INTRAMUSCULAR | Status: DC | PRN
  Administered 2021-07-25: 12.5 mg via INTRAVENOUS

## 2021-07-25 MED ORDER — KETOROLAC TROMETHAMINE 30 MG/ML IJ SOLN: 30.0000 mg | Freq: Once | INTRAMUSCULAR | Status: DC

## 2021-07-25 MED ORDER — OXYTOCIN BOLUS FROM INFUSION: 333.0000 mL | Freq: Once | INTRAVENOUS | Status: DC

## 2021-07-25 MED ORDER — NALOXONE HCL 0.4 MG/ML IJ SOLN: 0.4000 mg | INTRAMUSCULAR | Status: DC | PRN

## 2021-07-25 MED ORDER — OXYTOCIN-SODIUM CHLORIDE 30-0.9 UT/500ML-% IV SOLN
2.5000 [IU]/h | INTRAVENOUS | Status: AC
  Administered 2021-07-25: 2.5 [IU]/h via INTRAVENOUS
  Filled 2021-07-25: qty 500

## 2021-07-25 MED ORDER — METHYLERGONOVINE MALEATE 0.2 MG/ML IJ SOLN: 0.2000 mg | INTRAMUSCULAR | Status: DC | PRN

## 2021-07-25 MED ORDER — METHYLERGONOVINE MALEATE 0.2 MG PO TABS
0.2000 mg | ORAL_TABLET | ORAL | Status: AC
  Administered 2021-07-25 – 2021-07-26 (×6): 0.2 mg via ORAL
  Filled 2021-07-25 (×6): qty 1

## 2021-07-25 MED ORDER — MORPHINE SULFATE (PF) 0.5 MG/ML IJ SOLN
INTRAMUSCULAR | Status: AC
  Filled 2021-07-25: qty 10

## 2021-07-25 MED ORDER — PHENYLEPHRINE 40 MCG/ML (10ML) SYRINGE FOR IV PUSH (FOR BLOOD PRESSURE SUPPORT): 80.0000 ug | PREFILLED_SYRINGE | INTRAVENOUS | Status: DC | PRN

## 2021-07-25 MED ORDER — CEFAZOLIN SODIUM-DEXTROSE 2-3 GM-%(50ML) IV SOLR
INTRAVENOUS | Status: DC | PRN
  Administered 2021-07-25: 2 g via INTRAVENOUS

## 2021-07-25 MED ORDER — ONDANSETRON HCL 4 MG/2ML IJ SOLN: 4.0000 mg | Freq: Once | INTRAMUSCULAR | Status: DC | PRN

## 2021-07-25 MED ORDER — KETOROLAC TROMETHAMINE 30 MG/ML IJ SOLN
INTRAMUSCULAR | Status: AC
  Filled 2021-07-25: qty 1

## 2021-07-25 MED ORDER — LIDOCAINE HCL (PF) 1 % IJ SOLN
INTRAMUSCULAR | Status: DC | PRN
  Administered 2021-07-25: 5 mL via EPIDURAL

## 2021-07-25 MED ORDER — LACTATED RINGERS IV SOLN: INTRAVENOUS | Status: DC

## 2021-07-25 MED ORDER — LACTATED RINGERS IV SOLN: INTRAVENOUS | Status: DC | PRN

## 2021-07-25 MED ORDER — HYDROMORPHONE HCL 1 MG/ML IJ SOLN: 0.2500 mg | INTRAMUSCULAR | Status: DC | PRN

## 2021-07-25 MED ORDER — DIBUCAINE (PERIANAL) 1 % EX OINT: 1.0000 "application " | TOPICAL_OINTMENT | CUTANEOUS | Status: DC | PRN

## 2021-07-25 MED ORDER — FENTANYL-BUPIVACAINE-NACL 0.5-0.125-0.9 MG/250ML-% EP SOLN: 12.0000 mL/h | EPIDURAL | Status: DC | PRN

## 2021-07-25 MED ORDER — LEVOTHYROXINE SODIUM 112 MCG PO TABS
224.0000 ug | ORAL_TABLET | ORAL | Status: DC
  Filled 2021-07-25: qty 2

## 2021-07-25 MED ORDER — ONDANSETRON HCL 4 MG/2ML IJ SOLN: 4.0000 mg | Freq: Three times a day (TID) | INTRAMUSCULAR | Status: DC | PRN

## 2021-07-25 MED ORDER — LEVOTHYROXINE SODIUM 112 MCG PO TABS
224.0000 ug | ORAL_TABLET | ORAL | Status: DC
  Administered 2021-07-26: 224 ug via ORAL
  Filled 2021-07-25 (×2): qty 2

## 2021-07-25 MED ORDER — LACTATED RINGERS IV SOLN
500.0000 mL | Freq: Once | INTRAVENOUS | Status: AC
  Administered 2021-07-25: 500 mL via INTRAVENOUS

## 2021-07-25 MED ORDER — MORPHINE SULFATE (PF) 0.5 MG/ML IJ SOLN
INTRAMUSCULAR | Status: DC | PRN
  Administered 2021-07-25: 3 mg via EPIDURAL

## 2021-07-25 MED ORDER — SIMETHICONE 80 MG PO CHEW: 80.0000 mg | CHEWABLE_TABLET | ORAL | Status: DC | PRN

## 2021-07-25 MED ORDER — SENNOSIDES-DOCUSATE SODIUM 8.6-50 MG PO TABS
2.0000 | ORAL_TABLET | Freq: Every day | ORAL | Status: DC
  Administered 2021-07-26 – 2021-07-27 (×2): 2 via ORAL
  Filled 2021-07-25 (×2): qty 2

## 2021-07-25 MED ORDER — ACETAMINOPHEN 10 MG/ML IV SOLN
INTRAVENOUS | Status: DC | PRN
  Administered 2021-07-25: 1000 mg via INTRAVENOUS

## 2021-07-25 MED ORDER — OXYCODONE-ACETAMINOPHEN 5-325 MG PO TABS: 1.0000 | ORAL_TABLET | ORAL | Status: DC | PRN

## 2021-07-25 MED ORDER — OXYCODONE HCL 5 MG PO TABS: 5.0000 mg | ORAL_TABLET | Freq: Once | ORAL | Status: DC | PRN

## 2021-07-25 MED ORDER — SOD CITRATE-CITRIC ACID 500-334 MG/5ML PO SOLN: 30.0000 mL | ORAL | Status: DC | PRN

## 2021-07-25 MED ORDER — TETANUS-DIPHTH-ACELL PERTUSSIS 5-2.5-18.5 LF-MCG/0.5 IM SUSY: 0.5000 mL | PREFILLED_SYRINGE | Freq: Once | INTRAMUSCULAR | Status: DC

## 2021-07-25 MED ORDER — DIPHENHYDRAMINE HCL 50 MG/ML IJ SOLN: 12.5000 mg | INTRAMUSCULAR | Status: DC | PRN

## 2021-07-25 MED ORDER — ONDANSETRON HCL 4 MG/2ML IJ SOLN
INTRAMUSCULAR | Status: DC | PRN
  Administered 2021-07-25: 4 mg via INTRAVENOUS

## 2021-07-25 MED ORDER — PHENYLEPHRINE 40 MCG/ML (10ML) SYRINGE FOR IV PUSH (FOR BLOOD PRESSURE SUPPORT)
PREFILLED_SYRINGE | INTRAVENOUS | Status: AC
  Filled 2021-07-25: qty 10

## 2021-07-25 MED ORDER — FENTANYL-BUPIVACAINE-NACL 0.5-0.125-0.9 MG/250ML-% EP SOLN
EPIDURAL | Status: DC | PRN
  Administered 2021-07-25: 12 mL/h via EPIDURAL

## 2021-07-25 MED ORDER — MISOPROSTOL 25 MCG QUARTER TABLET
25.0000 ug | ORAL_TABLET | ORAL | Status: DC | PRN
  Administered 2021-07-25 (×2): 25 ug via VAGINAL
  Filled 2021-07-25 (×2): qty 1

## 2021-07-25 MED ORDER — LACTATED RINGERS IV SOLN: 500.0000 mL | INTRAVENOUS | Status: DC | PRN

## 2021-07-25 MED ORDER — PHENYLEPHRINE HCL (PRESSORS) 10 MG/ML IV SOLN
INTRAVENOUS | Status: DC | PRN
Start: 2021-07-25 — End: 2021-07-25
  Administered 2021-07-25 (×2): 80 ug via INTRAVENOUS
  Administered 2021-07-25: 120 ug via INTRAVENOUS
  Administered 2021-07-25: 80 ug via INTRAVENOUS

## 2021-07-25 MED ORDER — SODIUM CHLORIDE 0.9% FLUSH: 3.0000 mL | INTRAVENOUS | Status: DC | PRN

## 2021-07-25 MED ORDER — ACETAMINOPHEN 500 MG PO TABS
1000.0000 mg | ORAL_TABLET | Freq: Four times a day (QID) | ORAL | Status: DC
  Administered 2021-07-25 – 2021-07-27 (×6): 1000 mg via ORAL
  Filled 2021-07-25 (×8): qty 2

## 2021-07-25 MED ORDER — SIMETHICONE 80 MG PO CHEW
80.0000 mg | CHEWABLE_TABLET | Freq: Three times a day (TID) | ORAL | Status: DC
  Administered 2021-07-25 – 2021-07-27 (×6): 80 mg via ORAL
  Filled 2021-07-25 (×6): qty 1

## 2021-07-25 MED ORDER — FENTANYL-BUPIVACAINE-NACL 0.5-0.125-0.9 MG/250ML-% EP SOLN
EPIDURAL | Status: AC
  Filled 2021-07-25: qty 250

## 2021-07-25 MED ORDER — EPHEDRINE 5 MG/ML INJ
10.0000 mg | INTRAVENOUS | Status: AC | PRN
  Administered 2021-07-25 (×2): 5 mg via INTRAVENOUS

## 2021-07-25 MED ORDER — METHYLERGONOVINE MALEATE 0.2 MG/ML IJ SOLN: 0.2000 mg | INTRAMUSCULAR | Status: AC

## 2021-07-25 MED ORDER — MENTHOL 3 MG MT LOZG: 1.0000 | LOZENGE | OROMUCOSAL | Status: DC | PRN

## 2021-07-25 MED ORDER — TERBUTALINE SULFATE 1 MG/ML IJ SOLN: 0.2500 mg | Freq: Once | INTRAMUSCULAR | Status: DC | PRN

## 2021-07-25 MED ORDER — ZOLPIDEM TARTRATE 5 MG PO TABS: 5.0000 mg | ORAL_TABLET | Freq: Every evening | ORAL | Status: DC | PRN

## 2021-07-25 MED ORDER — PHENYLEPHRINE HCL-NACL 20-0.9 MG/250ML-% IV SOLN
INTRAVENOUS | Status: DC | PRN
  Administered 2021-07-25: 60 ug/min via INTRAVENOUS

## 2021-07-25 MED ORDER — OXYTOCIN-SODIUM CHLORIDE 30-0.9 UT/500ML-% IV SOLN
INTRAVENOUS | Status: AC
  Filled 2021-07-25: qty 500

## 2021-07-25 MED ORDER — OXYCODONE HCL 5 MG PO TABS
5.0000 mg | ORAL_TABLET | ORAL | Status: DC | PRN
  Administered 2021-07-26: 5 mg via ORAL
  Administered 2021-07-27 (×3): 10 mg via ORAL
  Filled 2021-07-25: qty 1
  Filled 2021-07-25 (×3): qty 2

## 2021-07-25 MED ORDER — LEVOTHYROXINE SODIUM 112 MCG PO TABS: 168.0000 ug | ORAL_TABLET | ORAL | Status: DC

## 2021-07-25 MED ORDER — DIPHENHYDRAMINE HCL 25 MG PO CAPS: 25.0000 mg | ORAL_CAPSULE | Freq: Four times a day (QID) | ORAL | Status: DC | PRN

## 2021-07-25 MED ORDER — KETOROLAC TROMETHAMINE 30 MG/ML IJ SOLN
30.0000 mg | Freq: Once | INTRAMUSCULAR | Status: AC | PRN
  Administered 2021-07-25: 30 mg via INTRAVENOUS

## 2021-07-25 MED ORDER — WITCH HAZEL-GLYCERIN EX PADS: 1.0000 "application " | MEDICATED_PAD | CUTANEOUS | Status: DC | PRN

## 2021-07-25 MED ORDER — LEVOTHYROXINE SODIUM 112 MCG PO TABS
168.0000 ug | ORAL_TABLET | ORAL | Status: DC
  Administered 2021-07-27: 168 ug via ORAL
  Filled 2021-07-25: qty 1.5

## 2021-07-25 MED ORDER — PRENATAL MULTIVITAMIN CH
1.0000 | ORAL_TABLET | Freq: Every day | ORAL | Status: DC
  Administered 2021-07-26 – 2021-07-27 (×2): 1 via ORAL
  Filled 2021-07-25 (×2): qty 1

## 2021-07-25 MED ORDER — OXYCODONE HCL 5 MG/5ML PO SOLN: 5.0000 mg | Freq: Once | ORAL | Status: DC | PRN

## 2021-07-25 MED ORDER — DIPHENHYDRAMINE HCL 25 MG PO CAPS: 25.0000 mg | ORAL_CAPSULE | ORAL | Status: DC | PRN

## 2021-07-25 MED ORDER — DIPHENHYDRAMINE HCL 50 MG/ML IJ SOLN
INTRAMUSCULAR | Status: AC
  Filled 2021-07-25: qty 1

## 2021-07-25 MED ORDER — FENTANYL CITRATE (PF) 100 MCG/2ML IJ SOLN: 50.0000 ug | INTRAMUSCULAR | Status: DC | PRN

## 2021-07-25 MED ORDER — OXYCODONE-ACETAMINOPHEN 5-325 MG PO TABS: 2.0000 | ORAL_TABLET | ORAL | Status: DC | PRN

## 2021-07-25 MED ORDER — NALOXONE HCL 4 MG/10ML IJ SOLN
1.0000 ug/kg/h | INTRAVENOUS | Status: DC | PRN
  Filled 2021-07-25: qty 5

## 2021-07-25 MED ORDER — METHYLERGONOVINE MALEATE 0.2 MG PO TABS: 0.2000 mg | ORAL_TABLET | ORAL | Status: DC | PRN

## 2021-07-25 MED ORDER — MEPERIDINE HCL 25 MG/ML IJ SOLN: 6.2500 mg | INTRAMUSCULAR | Status: DC | PRN

## 2021-07-25 MED ORDER — LIDOCAINE HCL (PF) 1 % IJ SOLN: 30.0000 mL | INTRAMUSCULAR | Status: DC | PRN

## 2021-07-25 MED ORDER — COCONUT OIL OIL: 1.0000 "application " | TOPICAL_OIL | Status: DC | PRN

## 2021-07-25 MED ORDER — SCOPOLAMINE 1 MG/3DAYS TD PT72
MEDICATED_PATCH | TRANSDERMAL | Status: AC
  Filled 2021-07-25: qty 1

## 2021-07-25 MED ORDER — ONDANSETRON HCL 4 MG/2ML IJ SOLN: 4.0000 mg | Freq: Four times a day (QID) | INTRAMUSCULAR | Status: DC | PRN

## 2021-07-25 MED ORDER — ACETAMINOPHEN 325 MG PO TABS: 650.0000 mg | ORAL_TABLET | ORAL | Status: DC | PRN

## 2021-07-25 MED ORDER — DEXAMETHASONE SODIUM PHOSPHATE 4 MG/ML IJ SOLN
INTRAMUSCULAR | Status: DC | PRN
  Administered 2021-07-25: 4 mg via INTRAVENOUS

## 2021-07-25 SURGICAL SUPPLY — 38 items
ADH SKN CLS APL DERMABOND .7 (GAUZE/BANDAGES/DRESSINGS)
ADH SKN CLS LQ APL DERMABOND (GAUZE/BANDAGES/DRESSINGS) ×1
CHLORAPREP W/TINT 26ML (MISCELLANEOUS) ×3 IMPLANT
CLAMP CORD UMBIL (MISCELLANEOUS) IMPLANT
CLIP FILSHIE TUBAL LIGA STRL (Clip) IMPLANT
CLOTH BEACON ORANGE TIMEOUT ST (SAFETY) ×3 IMPLANT
DERMABOND ADHESIVE PROPEN (GAUZE/BANDAGES/DRESSINGS) ×1
DERMABOND ADVANCED (GAUZE/BANDAGES/DRESSINGS)
DERMABOND ADVANCED .7 DNX12 (GAUZE/BANDAGES/DRESSINGS) IMPLANT
DERMABOND ADVANCED .7 DNX6 (GAUZE/BANDAGES/DRESSINGS) IMPLANT
DRSG OPSITE POSTOP 4X10 (GAUZE/BANDAGES/DRESSINGS) ×3 IMPLANT
ELECT REM PT RETURN 9FT ADLT (ELECTROSURGICAL) ×2
ELECTRODE REM PT RTRN 9FT ADLT (ELECTROSURGICAL) ×2 IMPLANT
EXTRACTOR VACUUM M CUP 4 TUBE (SUCTIONS) IMPLANT
GLOVE BIO SURGEON STRL SZ7 (GLOVE) ×3 IMPLANT
GLOVE BIOGEL PI IND STRL 7.0 (GLOVE) ×2 IMPLANT
GLOVE BIOGEL PI INDICATOR 7.0 (GLOVE) ×1
GOWN STRL REUS W/TWL LRG LVL3 (GOWN DISPOSABLE) ×6 IMPLANT
KIT ABG SYR 3ML LUER SLIP (SYRINGE) IMPLANT
NDL HYPO 25X5/8 SAFETYGLIDE (NEEDLE) IMPLANT
NEEDLE HYPO 22GX1.5 SAFETY (NEEDLE) IMPLANT
NEEDLE HYPO 25X5/8 SAFETYGLIDE (NEEDLE) IMPLANT
NS IRRIG 1000ML POUR BTL (IV SOLUTION) ×3 IMPLANT
PACK C SECTION WH (CUSTOM PROCEDURE TRAY) ×3 IMPLANT
PAD OB MATERNITY 4.3X12.25 (PERSONAL CARE ITEMS) ×3 IMPLANT
PENCIL SMOKE EVAC W/HOLSTER (ELECTROSURGICAL) ×3 IMPLANT
RTRCTR C-SECT PINK 25CM LRG (MISCELLANEOUS) ×3 IMPLANT
SUT CHROMIC 1 CTX 36 (SUTURE) ×8 IMPLANT
SUT CHROMIC 2 0 CT 1 (SUTURE) ×4 IMPLANT
SUT PDS AB 0 CTX 60 (SUTURE) ×4 IMPLANT
SUT PLAIN 2 0 XLH (SUTURE) ×1 IMPLANT
SUT VIC AB 2-0 CT1 27 (SUTURE) ×4
SUT VIC AB 2-0 CT1 TAPERPNT 27 (SUTURE) ×2 IMPLANT
SUT VIC AB 4-0 KS 27 (SUTURE) ×1 IMPLANT
SYR 30ML LL (SYRINGE) IMPLANT
TOWEL OR 17X24 6PK STRL BLUE (TOWEL DISPOSABLE) ×3 IMPLANT
TRAY FOLEY W/BAG SLVR 14FR LF (SET/KITS/TRAYS/PACK) ×3 IMPLANT
WATER STERILE IRR 1000ML POUR (IV SOLUTION) ×3 IMPLANT

## 2021-07-25 NOTE — Progress Notes (Addendum)
Patient ID: Grace Wall, female   DOB: 18-Apr-1986, 36 y.o.   MRN: [id number]  S: Comfortable after epidural placement O: AFVSS AOX3, NAD FHR 130 reactive cat 1 tracing Cvx 5-6/80/-2 Toco Q 2-3   AROM copious clear fluid. Hand kept in place after amniotomy to allow amniotic fluid drainage and for vertex to apply to cervix.   FWB reassuring

## 2021-07-25 NOTE — Transfer of Care (Signed)
Immediate Anesthesia Transfer of Care Note  Patient: Grace Wall  Procedure(s) Performed: CESAREAN SECTION  Patient Location: PACU  Anesthesia Type:Epidural  Level of Consciousness: awake, alert  and oriented  Airway & Oxygen Therapy: Patient Spontanous Breathing  Post-op Assessment: Report given to RN and Post -op Vital signs reviewed and stable  Post vital signs: Reviewed and stable  Last Vitals:  Vitals Value Taken Time  BP 96/52 07/25/21 1347  Temp    Pulse 77 07/25/21 1352  Resp 22 07/25/21 1352  SpO2 100 % 07/25/21 1352  Vitals shown include unvalidated device data.  Last Pain:  Vitals:   07/25/21 1137  TempSrc:   PainSc: 3          Complications: No notable events documented.

## 2021-07-25 NOTE — Progress Notes (Signed)
Patient ID: Grace Wall, female   DOB: 1986/01/13, 36 y.o.   MRN: [id number]   Called to see patient for fetal deceleration.  Upon arrival RN performing exam noted cord prolapse.  RN was was elevating the fetal head and the cord prolapse and remained in the patient's bed.  RN was concerned for malpresentation.  The decision was made to proceed immediately to the operating room for a stat cesarean.

## 2021-07-25 NOTE — H&P (Signed)
Grace Wall is a 36 y.o. female presenting for induction of labor  36 yo [phone] @ 39+6 presents for IOL for AMA. Her pregnancy has been complicated by AMA, hypothyroidism, and gestational thrombocytopenia. Mild renal pyelectasis was noted on [place] for BPP and presentation. 7/78mm does not meet criteria for UTD in 3rd trimester. At 36 weeks baby was breech presentation and the patient was scheduled for ECV. The baby spontaneously verted to cephalic presentation. OB History     Gravida  4   Para  3   Term  3   Preterm      AB      Living  3      SAB      IAB      Ectopic      Multiple  0   Live Births  3          Past Medical History:  Diagnosis Date   Exophthalmos    Gestational thrombocytopenia (HCC)    Hypothyroidism, acquired, autoimmune    Supervision of low-risk pregnancy 11/28/2015    Clinic  [facility] [place] Prenatal Labs Dating  LMP Blood type: O/Positive/-- (05/11 1044)  Genetic Screen 1 Screen: negative   AFP:     Quad:     NIPS: Antibody:Negative (05/11 1044) Anatomic [place]  normal female fetus Rubella: 30.10 (05/11 1044) GTT Third trimester: normal RPR: Non Reactive (10/11 1443)  Flu vaccine  05/11/16 HBsAg: Negative (05/11 1044)  TDaP vaccine   05/11/16                              HIV: Non Reactive (10/11 1443)  Baby Food  breast                          GBS:  Contraception undecided Pap: Negative (08/29/2015) Circumcision N/a female  Pediatrician  [facility]  Support Person husband      Thyroiditis, autoimmune    Past Surgical History:  Procedure Laterality Date   NO PAST SURGERIES     Family History: family history includes Cancer in her maternal aunt; Diabetes in her mother; Hypertension in her father; Thyroid disease in her son. Social History:  reports that she has never smoked. She has never used smokeless tobacco. She reports that she does not drink alcohol and does not use drugs.     Maternal Diabetes: No Genetic Screening: Normal Maternal  Ultrasounds/Referrals: Normal Fetal Ultrasounds or other Referrals:  None Maternal Substance Abuse:  No Significant Maternal Medications:  Meds include: Syntroid Significant Maternal Lab Results:  None Other Comments:  None  Review of Systems History Dilation: 5.5 Effacement (%): Thick Station: -3 Exam by:: Dr. Tenny Craw Blood pressure (!) 96/52, pulse 66, temperature 97.9 F (36.6 C), temperature source Oral, resp. rate 12, height 5\' 5"  (1.651 m), weight 90.7 kg, SpO2 98 %, unknown if currently breastfeeding. Exam Physical Exam  Prenatal labs: ABO, Rh: --/--/O POS (01/06 0023) Antibody: NEG (01/06 0023) Rubella: Immune (06/21 0000) RPR: NON REACTIVE (01/06 0023)  HBsAg: Negative (06/21 0000)  HIV: Non-reactive (06/21 0000)  GBS: Negative/-- (12/14 0000)   Assessment/Plan: 1) Admit 2) cytotec/AROM/pit 3) Epidural on request   [birth year]31[birth year]1992 07/25/2021, 2:17 PM

## 2021-07-25 NOTE — Anesthesia Procedure Notes (Signed)
Epidural Patient location during procedure: OB Start time: 07/25/2021 10:20 AM End time: 07/25/2021 10:39 AM  Staffing Anesthesiologist: Trevor Iha, MD Performed: anesthesiologist   Preanesthetic Checklist Completed: patient identified, IV checked, site marked, risks and benefits discussed, surgical consent, monitors and equipment checked, pre-op evaluation and timeout performed  Epidural Patient position: sitting Prep: DuraPrep and site prepped and draped Patient monitoring: continuous pulse ox and blood pressure Approach: midline Location: L3-L4 Injection technique: LOR air  Needle:  Needle type: Tuohy  Needle gauge: 17 G Needle length: 9 cm and 9 Needle insertion depth: 8 cm Catheter type: closed end flexible Catheter size: 19 Gauge Catheter at skin depth: 13 cm Test dose: negative  Assessment Events: blood not aspirated, injection not painful, no injection resistance, no paresthesia and negative IV test  Additional Notes Patient identified. Risks/Benefits/Options discussed with patient including but not limited to bleeding, infection, nerve damage, paralysis, failed block, incomplete pain control, headache, blood pressure changes, nausea, vomiting, reactions to medication both or allergic, itching and postpartum back pain. Confirmed with bedside nurse the patient's most recent platelet count. Confirmed with patient that they are not currently taking any anticoagulation, have any bleeding history or any family history of bleeding disorders. Patient expressed understanding and wished to proceed. All questions were answered. Sterile technique was used throughout the entire procedure. Please see nursing notes for vital signs. Test dose was given through epidural needle and negative prior to continuing to dose epidural or start infusion. Warning signs of high block given to the patient including shortness of breath, tingling/numbness in hands, complete motor block, or any  concerning symptoms with instructions to call for help. Patient was given instructions on fall risk and not to get out of bed. All questions and concerns addressed with instructions to call with any issues.  1 Attempt (S) . Patient tolerated procedure well.

## 2021-07-25 NOTE — Op Note (Signed)
Operative Report  Date: 07/25/2021  Pre-Operative Diagnosis: 1) 39+6-week intrauterine pregnancy 2) umbilical cord prolapse 3) advanced maternal age  Postoperative Diagnosis: 1) 39+6-week intrauterine pregnancy 2) umbilical cord prolapse 3) advanced maternal age  Procedure: Primary emergency low transverse cesarean delivery  Surgeon: Dr. Vanessa Kick  Assistant: None  Antibiotics: 2 g Ancef  Operative Findings: Vigorous female infant in the vertex presentation.  Normal-appearing ovaries and tubes.  Apgar scores of 8 at 1 minute and 9 at 5 minutes. 3730 gm (8#3.6)   Specimen: Placenta for disposal  EBL: Total I/O In: 1150 [I.V.:1000; IV Piggyback:150] Out: 748 [Urine:150; Blood:598]   Procedure:Grace Wall is an 36 year old gravida 4 para 3003 at 22 weeks and 6 days estimated gestational age who presents for emergency cesarean section.  Approximately 5 minutes after amniotomy was performed difficulty tracing fetal heart tones was encountered.  Vaginal exam confirmed umbilical cord prolapse.  And emergency cesarean section was called.  Due to the emergency nature of the procedure verbal consent was obtained from the patient and a formal timeout was not performed. Epidural anesthesia was administered and found to be adequate. She was placed in the dorsal supine position with a leftward tilt.  A Betadine splash was performed and the patient was draped in a sterile fashion.  The scalpel was then used to make a Pfannenstiel skin incision which was carried down to the underlying layers of soft tissue to the fascia. The fascia was incised in the midline and the fascial incision was extended laterally bluntly.  The rectus muscles were separated in the midline. The abdominal peritoneum was identified, tented up, entered bluntly.  The Alexis retractor was then deployed.  The scalpel was then used to make a low transverse incision on the uterus which was extended laterally with blunt dissection. The  fetal vertex was identified, delivered easily through the uterine incision followed by the body. The infant was bulb suctioned on the operative field and cried vigorously.  Following a 1 minute delay, the cord was clamped and cut. The infant was passed to the waiting neonatology team. Placenta was then delivered spontaneously and the uterus was cleared of all clot and debris. The uterine incision was repaired with #1 chromic in running locked fashion followed by a second imbricating layer. The ovaries and tubes were inspected and normal. The Alexis retractor was removed. The abdominal peritoneum was reapproximated with 2-0 Vicryl in a running fashion. The rectus muscles was reapproximated with 2-0 chromic in a running fashion. The fascia was closed with 0-looped PDS in a running fashion.  The subcutaneous tissue was reapproximated with 2-0 plain gut interrupted sutures.  The skin was closed with 4-0 vicryl in a subcuticular fashion and Dermabond. All laparotomy sponge, instrument, and needle counts were correct.  The patient tolerated the procedure well and was transferred to the recovery unit in stable condition following the procedure.

## 2021-07-25 NOTE — Anesthesia Preprocedure Evaluation (Signed)
Anesthesia Evaluation  Patient identified by MRN, date of birth, ID band Patient awake    Reviewed: Allergy & Precautions, NPO status , Patient's Chart, lab work & pertinent test results  Airway Mallampati: II  TM Distance: >3 FB Neck ROM: Full    Dental no notable dental hx. (+) Teeth Intact, Dental Advisory Given   Pulmonary neg pulmonary ROS,    Pulmonary exam normal breath sounds clear to auscultation       Cardiovascular Exercise Tolerance: Good Normal cardiovascular exam Rhythm:Regular Rate:Normal     Neuro/Psych negative neurological ROS  negative psych ROS   GI/Hepatic negative GI ROS, Neg liver ROS,   Endo/Other  Hypothyroidism   Renal/GU      Musculoskeletal negative musculoskeletal ROS (+)   Abdominal   Peds  Hematology Lab Results      Component                Value               Date                            HGB                      12.4                07/25/2021                HCT                      36.0                07/25/2021                       PLT                      104 (L)             07/25/2021              Anesthesia Other Findings   Reproductive/Obstetrics (+) Pregnancy                             Anesthesia Physical Anesthesia Plan  ASA: 2  Anesthesia Plan: Epidural   Post-op Pain Management:    Induction:   PONV Risk Score and Plan: Treatment may vary due to age or medical condition  Airway Management Planned:   Additional Equipment:   Intra-op Plan:   Post-operative Plan:   Informed Consent: I have reviewed the patients History and Physical, chart, labs and discussed the procedure including the risks, benefits and alternatives for the proposed anesthesia with the patient or authorized representative who has indicated his/her understanding and acceptance.       Plan Discussed with: CRNA and Anesthesiologist  Anesthesia Plan  Comments: (39.6 wk G4P3 w gestational thrombocytopenia for LEA)        Anesthesia Quick Evaluation

## 2021-07-25 NOTE — Progress Notes (Addendum)
Called to pts room for c/o gushing. Pad checked with a moderate amount of lochia, no clots. Measured weight of 69ml. No additional trickles or gushes with fundal massage. Fundal level midline at U, firm.

## 2021-07-26 LAB — CBC
HCT: 30.2 % — ABNORMAL LOW (ref 36.0–46.0)
Hemoglobin: 10.2 g/dL — ABNORMAL LOW (ref 12.0–15.0)
MCH: 31.6 pg (ref 26.0–34.0)
MCHC: 33.8 g/dL (ref 30.0–36.0)
MCV: 93.5 fL (ref 80.0–100.0)
Platelets: 81 10*3/uL — ABNORMAL LOW (ref 150–400)
RBC: 3.23 MIL/uL — ABNORMAL LOW (ref 3.87–5.11)
RDW: 13.4 % (ref 11.5–15.5)
WBC: 7.8 10*3/uL (ref 4.0–10.5)
nRBC: 0 % (ref 0.0–0.2)

## 2021-07-26 NOTE — Progress Notes (Signed)
Subjective: [name]: Cesarean Delivery Patient reports tolerating PO, + flatus, and no problems voiding. She reports pain well controlled, lochia moderate, denies HA/SOB or CP. Pt with questions about indication for delivery via cesarean section. She is bonding well with baby - breastfeeding. Desires circumcision  Objective: Vital signs in last 24 hours: Temp:  [97.6 F (36.4 C)-98.4 F (36.9 C)] 98.3 F (36.8 C) (01/07 0503) Pulse Rate:  [57-83] 64 (01/07 0017) Resp:  [12-22] 16 (01/07 0017) BP: (86-118)/(48-87) 103/49 (01/07 0017) SpO2:  [96 %-100 %] 99 % (01/07 0503)  Physical Exam:  General: alert, cooperative, and no distress Lochia: appropriate Uterine Fundus: firm Incision: no significant drainage DVT Evaluation: No evidence of DVT seen on physical exam. Calf/Ankle edema is present.  Recent Labs    07/25/21 0023 07/26/21 0458  HGB 12.4 10.2*  HCT 36.0 30.2*    Assessment/Plan: Status post Cesarean section. Doing well postoperatively. - Hx GTP- platelets at  81K from 104K, lochia stable and no drainage on dressing. Ok to remove IV access Continue current care Discussed circumcision before discharge ; likely today  Pt understands that neonatal circumcision is not considered medically necessary and is elective. The risks include, but are not limited to bleeding, infection, damage to the penis, development of scar tissue, and having to have it redone at a later date. Pt understands theses risks and wishes to proceed .  Grace Wall 07/26/2021, 9:25 AM

## 2021-07-26 NOTE — Anesthesia Postprocedure Evaluation (Signed)
Anesthesia Post Note  Patient: Grace Wall  Procedure(s) Performed: CESAREAN SECTION     Patient location during evaluation: Mother Baby Anesthesia Type: Epidural Level of consciousness: oriented and awake and alert Pain management: pain level controlled Vital Signs Assessment: post-procedure vital signs reviewed and stable Respiratory status: spontaneous breathing and respiratory function stable Cardiovascular status: blood pressure returned to baseline and stable Postop Assessment: no headache, no backache, no apparent nausea or vomiting and able to ambulate Anesthetic complications: no   No notable events documented.  Last Vitals:  Vitals:   07/26/21 0017 07/26/21 0503  BP: (!) 103/49   Pulse: 64   Resp: 16   Temp: 36.8 C 36.8 C  SpO2: 98% 99%    Last Pain:  Vitals:   07/26/21 0503  TempSrc: Oral  PainSc:    Pain Goal:                   Trevor Iha

## 2021-07-27 MED ORDER — OXYCODONE-ACETAMINOPHEN 5-325 MG PO TABS: 1.0000 | ORAL_TABLET | Freq: Four times a day (QID) | ORAL | 0 refills | Status: AC | PRN

## 2021-07-27 MED ORDER — DOCUSATE SODIUM 100 MG PO CAPS: 100.0000 mg | ORAL_CAPSULE | Freq: Every day | ORAL | 0 refills | Status: AC

## 2021-07-27 MED ORDER — MAALOX 600 MG PO CHEW: 600.0000 mg | CHEWABLE_TABLET | Freq: Two times a day (BID) | ORAL | 1 refills | Status: AC

## 2021-07-27 NOTE — Discharge Instructions (Addendum)
°  Call office with any concerns [phone]  Remove honeycomb dressing on Wednesday, 07/30/21

## 2021-07-27 NOTE — Progress Notes (Signed)
Subjective: Postpartum Day 2: Cesarean Delivery Patient reports tolerating PO, + flatus, and no problems voiding. Reports pain moderately well controlled with oxycodone+tylenol but has gas pain. She denies HA, CP, SOB. She is bonding well with baby - breastfeeding. Feels ready for discharge to home today.    Objective: Vital signs in last 24 hours: Temp:  [98.3 F (36.8 C)-98.9 F (37.2 C)] 98.3 F (36.8 C) (01/08 0416) Pulse Rate:  [71-72] 71 (01/08 0416) Resp:  [17-18] 18 (01/08 0416) BP: (107-109)/(40-54) 108/54 (01/08 0416) SpO2:  [96 %-98 %] 96 % (01/08 0416)  Physical Exam:  General: alert, cooperative, and no distress Lochia: appropriate Uterine Fundus: firm, tympany from trapped gas  Incision: no significant drainage DVT Evaluation: No evidence of DVT seen on physical exam.  Recent Labs    07/25/21 0023 07/26/21 0458  HGB 12.4 10.2*  HCT 36.0 30.2*    Assessment/Plan: Status post Cesarean section. Doing well postoperatively.  Discharge home with standard precautions and return to clinic in 2 weeks for incision check and 6 weeks for postpartum visit   Sagan Wurzel W Saraih Lorton 07/27/2021, 11:27 AM

## 2021-07-27 NOTE — Discharge Summary (Signed)
Postpartum Discharge Summary  Date of Service updated      Patient Name: Grace Wall DOB: July 10, 1986 MRN: [id number]  Date of admission: 07/25/2021 Delivery date:07/25/2021  Delivering provider: Waynard Reeds  Date of discharge: 07/27/2021  Admitting diagnosis: Advanced maternal age in multigravida, third trimester [O09.523] Cesarean delivery, delivered, current hospitalization [O82] Intrauterine pregnancy: [redacted]w[redacted]d     Secondary diagnosis:  Principal Problem:   Advanced maternal age in multigravida, third trimester Active Problems:   Cesarean delivery, delivered, current hospitalization  Additional problems: cord prolapse    Discharge diagnosis: Term Pregnancy Delivered                                              Post partum procedures: n/a Augmentation: AROM Complications:Cord prolapse  Hospital course: Induction of Labor With Cesarean Section   36 y.o. yo [id number] at [redacted]w[redacted]d was admitted to the hospital 07/25/2021 for induction of labor. Patient had a labor course significant for cord prolapse after AROM. The patient went for cesarean section due to  cord prolapse . Delivery details are as follows: Membrane Rupture Time/Date: 12:40 PM ,07/25/2021   Delivery Method:C-Section, Low Vertical  Details of operation can be found in separate operative Note.  Patient had an uncomplicated postpartum course. She is ambulating, tolerating a regular diet, passing flatus, and urinating well.  Patient is discharged home in stable condition on 07/27/21.      Newborn Data: Birth date:07/25/2021  Birth time:12:57 PM  Gender:Female  Living status:Living  Apgars:8 ,9  Weight:3730 g                                Magnesium Sulfate received: No BMZ received: No  Physical exam  Vitals:   07/26/21 0503 07/26/21 1354 07/26/21 2020 07/27/21 0416  BP:  (!) 109/46 (!) 107/40 (!) 108/54  Pulse:  71 72 71  Resp:  18 17 18   Temp: 98.3 F (36.8 C) 98.7 F (37.1 C) 98.9 F (37.2 C) 98.3 F (36.8 C)   TempSrc: Oral  Oral Oral  SpO2: 99% 97% 98% 96%  Weight:      Height:       General: alert, cooperative, and no distress Lochia: appropriate Uterine Fundus: firm Incision: Healing well with no significant drainage DVT Evaluation: No evidence of DVT seen on physical exam. Labs: Lab Results  Component Value Date   WBC 7.8 07/26/2021   HGB 10.2 (L) 07/26/2021   HCT 30.2 (L) 07/26/2021   MCV 93.5 07/26/2021   PLT 81 (L) 07/26/2021   CMP Latest Ref Rng & Units 07/16/2021  Glucose 65 - 139 mg/dL -  BUN 7 - 25 mg/dL -  Creatinine 07/18/2021 - 0.24 mg/dL -  Sodium 0.97 - 353 mmol/L -  Potassium 3.5 - 5.3 mmol/L -  Chloride 98 - 110 mmol/L -  CO2 20 - 32 mmol/L -  Calcium 8.6 - 10.2 mg/dL 9.1  Total Protein 6.1 - 8.1 g/dL -  Total Bilirubin 0.2 - 1.2 mg/dL -  Alkaline Phos 38 - 299 U/L -  AST 10 - 30 U/L -  ALT 6 - 29 U/L -   Edinburgh Score: Edinburgh Postnatal Depression Scale Screening Tool 07/27/2021  I have been able to laugh and see the funny side of things. 1  I have  looked forward with enjoyment to things. 1  I have blamed myself unnecessarily when things went wrong. 0  I have been anxious or worried for no good reason. 2  I have felt scared or panicky for no good reason. 0  Things have been getting on top of me. 2  I have been so unhappy that I have had difficulty sleeping. 0  I have felt sad or miserable. 0  I have been so unhappy that I have been crying. 3  The thought of harming myself has occurred to me. 0  Edinburgh Postnatal Depression Scale Total 9      After visit meds:  Allergies as of 07/27/2021   No Known Allergies      Medication List     TAKE these medications    docusate sodium 100 MG capsule Commonly known as: Colace Take 1 capsule (100 mg total) by mouth daily.   ferrous sulfate 325 (65 FE) MG tablet Commonly known as: FerrouSul Take 1 pill daily What changed:  how much to take when to take this additional instructions   Maalox 600 MG  chewable tablet Generic drug: Calcium Carbonate Antacid Chew 1 tablet (600 mg total) by mouth 2 (two) times daily.   oxyCODONE-acetaminophen 5-325 MG tablet Commonly known as: Percocet Take 1-2 tablets by mouth every 6 (six) hours as needed for severe pain.   prenatal multivitamin Tabs tablet Take 1 tablet by mouth daily at 12 noon.   Synthroid 112 MCG tablet Generic drug: levothyroxine Take two 112 mcg tablets per day on even-numbered days and 1.5 tablets on odd-numbered days. What changed:  how much to take how to take this when to take this additional instructions   Vitamin D 125 MCG (5000 UT) Caps Take 5,000 Units by mouth 2 (two) times a week. [employer]         Discharge home in stable condition Infant Feeding: Breast Infant Disposition:home with mother Discharge instruction: per After Visit Summary and Postpartum booklet. Activity: Advance as tolerated. Pelvic rest for 6 weeks.  Diet: routine diet Anticipated Birth Control: Unsure Postpartum Appointment:6 weeks Additional Postpartum F/U: Incision check 1 week Future Appointments: Future Appointments  Date Time Provider Department Center  10/14/2021  9:30 AM David Stall, MD [facility]   Follow up Visit:  Follow-up Information     Ob/Gyn, [facility]. Schedule an appointment as soon as possible for a visit.   Why: For postpartum vision and incision check Contact information: [address] [place] [address] [phone]                     07/27/2021 Cathrine Muster, DO

## 2021-08-02 ENCOUNTER — Other Ambulatory Visit (INDEPENDENT_AMBULATORY_CARE_PROVIDER_SITE_OTHER): Payer: Self-pay | Admitting: "Endocrinology

## 2021-08-06 ENCOUNTER — Telehealth: Payer: Self-pay

## 2021-08-06 MED ORDER — SYNTHROID 112 MCG PO TABS: ORAL_TABLET | ORAL | 6 refills | Status: DC

## 2021-08-06 NOTE — Telephone Encounter (Signed)
Refill sent.

## 2021-08-07 ENCOUNTER — Telehealth (HOSPITAL_COMMUNITY): Payer: Self-pay | Admitting: *Deleted

## 2021-08-07 NOTE — Telephone Encounter (Signed)
Attempted Hospital Discharge Follow-Up Call.  Left voice mail requesting that patient return RN's phone call.  

## 2021-10-13 ENCOUNTER — Encounter (INDEPENDENT_AMBULATORY_CARE_PROVIDER_SITE_OTHER): Payer: Self-pay | Admitting: "Endocrinology

## 2021-10-13 ENCOUNTER — Encounter (INDEPENDENT_AMBULATORY_CARE_PROVIDER_SITE_OTHER): Payer: Self-pay

## 2021-10-13 LAB — COMPREHENSIVE METABOLIC PANEL
AG Ratio: 1.7 (calc) (ref 1.0–2.5)
ALT: 76 U/L — ABNORMAL HIGH (ref 6–29)
AST: 42 U/L — ABNORMAL HIGH (ref 10–30)
Albumin: 4.5 g/dL (ref 3.6–5.1)
Alkaline phosphatase (APISO): 45 U/L (ref 31–125)
BUN: 18 mg/dL (ref 7–25)
CO2: 25 mmol/L (ref 20–32)
Calcium: 9.4 mg/dL (ref 8.6–10.2)
Chloride: 106 mmol/L (ref 98–110)
Creat: 0.71 mg/dL (ref 0.50–0.97)
Globulin: 2.7 g/dL (calc) (ref 1.9–3.7)
Glucose, Bld: 94 mg/dL (ref 65–139)
Potassium: 4 mmol/L (ref 3.5–5.3)
Sodium: 142 mmol/L (ref 135–146)
Total Bilirubin: 0.4 mg/dL (ref 0.2–1.2)
Total Protein: 7.2 g/dL (ref 6.1–8.1)

## 2021-10-13 LAB — PTH, INTACT AND CALCIUM
Calcium: 9.4 mg/dL (ref 8.6–10.2)
PTH: 41 pg/mL (ref 16–77)

## 2021-10-13 LAB — TSH: TSH: 0.34 mIU/L — ABNORMAL LOW

## 2021-10-13 LAB — VITAMIN D 25 HYDROXY (VIT D DEFICIENCY, FRACTURES): Vit D, 25-Hydroxy: 28 ng/mL — ABNORMAL LOW (ref 30–100)

## 2021-10-13 LAB — T4, FREE: Free T4: 0.9 ng/dL (ref 0.8–1.8)

## 2021-10-13 LAB — T3, FREE: T3, Free: 3 pg/mL (ref 2.3–4.2)

## 2021-10-14 ENCOUNTER — Ambulatory Visit (INDEPENDENT_AMBULATORY_CARE_PROVIDER_SITE_OTHER): Payer: Medicaid Other | Admitting: "Endocrinology

## 2021-10-20 ENCOUNTER — Ambulatory Visit
Admission: EM | Admit: 2021-10-20 | Discharge: 2021-10-20 | Disposition: A | Discharge status: Home / Self Care | Payer: Medicaid Other | Attending: Internal Medicine | Admitting: Internal Medicine

## 2021-10-20 ENCOUNTER — Other Ambulatory Visit: Payer: Self-pay

## 2021-10-20 ENCOUNTER — Encounter: Payer: Self-pay | Admitting: Emergency Medicine

## 2021-10-20 DIAGNOSIS — H6591 Unspecified nonsuppurative otitis media, right ear: Secondary | ICD-10-CM | POA: Diagnosis not present

## 2021-10-20 DIAGNOSIS — J029 Acute pharyngitis, unspecified: Secondary | ICD-10-CM | POA: Diagnosis present

## 2021-10-20 LAB — POCT RAPID STREP A (OFFICE): Rapid Strep A Screen: NEGATIVE

## 2021-10-20 MED ORDER — CETIRIZINE HCL 10 MG PO TABS: 10.0000 mg | ORAL_TABLET | Freq: Every day | ORAL | 0 refills | Status: AC

## 2021-10-20 MED ORDER — FLUTICASONE PROPIONATE 50 MCG/ACT NA SUSP
1.0000 | Freq: Every day | NASAL | 0 refills | Status: AC
Start: 2021-10-20 — End: 2021-10-23

## 2021-10-20 NOTE — ED Triage Notes (Signed)
Patient c/o right ear pain and sore throat x 2 days.  Patient's daughter dx w/strep throat last week.  Patient has been taken Tylenol. ?

## 2021-10-20 NOTE — ED Provider Notes (Signed)
?[facility] ? ? ? ?CSN: [id number] ?Arrival date & time: 10/20/21  0901 ? ? ?  ? ?History   ?Chief Complaint ?Chief Complaint  ?Patient presents with  ? Otalgia  ? ? ?HPI ?Grace Wall is a 36 y.o. female.  ? ?Patient presents with right ear pain and sore throat that has been present for 2 to 3 days.  Patient reports that her daughter recently tested positive for strep throat last week.  Patient has taken Tylenol for symptoms with minimal improvement.  Denies runny nose, nasal congestion, cough, nausea, vomiting, diarrhea, abdominal pain, chest pain, shortness of breath. ? ? ?Otalgia ? ?Past Medical History:  ?Diagnosis Date  ? Exophthalmos   ? Gestational thrombocytopenia (HCC)   ? Hypothyroidism, acquired, autoimmune   ? Supervision of low-risk pregnancy 11/28/2015  ?  Clinic  [facility] [place] Prenatal Labs Dating  LMP Blood type: O/Positive/-- (05/11 1044)  Genetic Screen 1? Screen: negative   AFP:     Quad:     NIPS: Antibody:Negative (05/11 1044) Anatomic [place]  normal female fetus Rubella: 30.10 (05/11 1044) GTT Third trimester: normal RPR: Non Reactive (10/11 1443)  Flu vaccine  05/11/16 HBsAg: Negative (05/11 1044)  TDaP vaccine   05/11/16                              HIV: Non Reactive (10/11 1443)  Baby Food  breast                          GBS:  Contraception undecided Pap: Negative (08/29/2015) Circumcision N/a female  Pediatrician  [facility]  Support Person husband     ? Thyroiditis, autoimmune   ? ? ?Patient Active Problem List  ? Diagnosis Date Noted  ? Advanced maternal age in multigravida, third trimester 07/25/2021  ? Cesarean delivery, delivered, current hospitalization 07/25/2021  ? Hypothyroid in pregnancy, antepartum, first trimester 12/25/2020  ? Goiter 08/08/2019  ? Prediabetes 08/08/2019  ? 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 Overweight 02/02/2013  ? Hypothyroidism, acquired, autoimmune   ? Thyroiditis, autoimmune   ? ? ?Past Surgical History:  ?Procedure Laterality Date  ? CESAREAN  SECTION N/A 07/25/2021  ? Procedure: CESAREAN SECTION;  Surgeon: Waynard Reeds, MD;  Location: [facility] ORS;  Service: Obstetrics;  Laterality: N/A;  ? NO PAST SURGERIES    ? ? ?OB History   ? ? Gravida  ?4  ? Para  ?4  ? Term  ?4  ? Preterm  ?   ? AB  ?   ? Living  ?4  ?  ? ? SAB  ?   ? IAB  ?   ? Ectopic  ?   ? Multiple  ?0  ? Live Births  ?4  ?   ?  ?  ? ? ? ?Home Medications   ? ?Prior to Admission medications   ?Medication Sig Start Date End Date Taking? Authorizing Provider  ?cetirizine (ZYRTEC) 10 MG tablet Take 1 tablet (10 mg total) by mouth daily for 10 days. 10/20/21 10/30/21 Yes Gustavus Bryant, FNP  ?Cholecalciferol (VITAMIN D) 125 MCG (5000 UT) CAPS Take 5,000 Units by mouth 2 (two) times a week. [employer]   Yes [provider]  ?ferrous sulfate (FERROUSUL) 325 (65 FE) MG tablet Take 1 pill daily ?Patient taking differently: 325 mg daily with breakfast. 04/20/19  Yes David Stall, MD  ?fluticasone Aleda Grana)  50 MCG/ACT nasal spray Place 1 spray into both nostrils daily for 3 days. 10/20/21 10/23/21 Yes Gustavus Bryant, FNP  ?Prenatal Vit-Fe Fumarate-FA (PRENATAL MULTIVITAMIN) TABS tablet Take 1 tablet by mouth daily at 12 noon.   Yes [provider]  ?SYNTHROID 112 MCG tablet Take two 112 mcg tablets per day on even-numbered days and 1.5 tablets on odd-numbered days. 08/06/21 08/06/22 Yes David Stall, MD  ?Calcium Carbonate Antacid (MAALOX) 600 MG chewable tablet Chew 1 tablet (600 mg total) by mouth 2 (two) times daily. 07/27/21   Edwinna Areola, DO  ?docusate sodium (COLACE) 100 MG capsule Take 1 capsule (100 mg total) by mouth daily. 07/27/21   Edwinna Areola, DO  ?oxyCODONE-acetaminophen (PERCOCET) 5-325 MG tablet Take 1-2 tablets by mouth every 6 (six) hours as needed for severe pain. 07/27/21   Edwinna Areola, DO  ? ? ?Family History ?Family History  ?Problem Relation Age of Onset  ? Diabetes Mother   ? Hypertension Father   ? Thyroid disease Son   ? Cancer Maternal Aunt    ? ? ?Social History ?Social History  ? ?Tobacco Use  ? Smoking status: Never  ? Smokeless tobacco: Never  ?Vaping Use  ? Vaping Use: Never used  ?Substance Use Topics  ? Alcohol use: No  ?  Comment: ocassionally  ? Drug use: No  ? ? ? ?Allergies   ?Patient has no known allergies. ? ? ?Review of Systems ?Review of Systems ?Per HPI ? ?Physical Exam ?Triage Vital Signs ?ED Triage Vitals  ?Enc Vitals Group  ?   BP 10/20/21 1020 110/78  ?   Pulse Rate 10/20/21 1020 70  ?   Resp 10/20/21 1020 18  ?   Temp 10/20/21 1020 98.1 ?F (36.7 ?C)  ?   Temp Source 10/20/21 1020 Oral  ?   SpO2 10/20/21 1020 96 %  ?   Weight 10/20/21 1021 160 lb (72.6 kg)  ?   Height 10/20/21 1021 5\' 5"  (1.651 m)  ?   Head Circumference --   ?   Peak Flow --   ?   Pain Score 10/20/21 1021 4  ?   Pain Loc --   ?   Pain Edu? --   ?   Excl. in GC? --   ? ?No data found. ? ?Updated Vital Signs ?BP 110/78 (BP Location: Left Arm)   Pulse 70   Temp 98.1 ?F (36.7 ?C) (Oral)   Resp 18   Ht 5\' 5"  (1.651 m)   Wt 160 lb (72.6 kg)   LMP  (LMP Unknown)   SpO2 96%   Breastfeeding Yes   BMI 26.63 kg/m?  ? ?Visual Acuity ?Right Eye Distance:   ?Left Eye Distance:   ?Bilateral Distance:   ? ?Right Eye Near:   ?Left Eye Near:    ?Bilateral Near:    ? ?Physical Exam ?Constitutional:   ?   General: She is not in acute distress. ?   Appearance: Normal appearance. She is not toxic-appearing or diaphoretic.  ?HENT:  ?   Head: Normocephalic and atraumatic.  ?   Right Ear: Ear canal normal. No laceration, drainage, swelling or tenderness. A middle ear effusion is present. No mastoid tenderness. Tympanic membrane is not erythematous or bulging.  ?   Left Ear: Tympanic membrane and ear canal normal.  ?   Nose: Nose normal.  ?   Mouth/Throat:  ?   Mouth: Mucous membranes are moist.  ?  Pharynx: Posterior oropharyngeal erythema present.  ?Eyes:  ?   Extraocular Movements: Extraocular movements intact.  ?   Conjunctiva/sclera: Conjunctivae normal.  ?   Pupils: Pupils are  equal, round, and reactive to light.  ?Cardiovascular:  ?   Rate and Rhythm: Normal rate and regular rhythm.  ?   Pulses: Normal pulses.  ?   Heart sounds: Normal heart sounds.  ?Pulmonary:  ?   Effort: Pulmonary effort is normal. No respiratory distress.  ?   Breath sounds: Normal breath sounds. No stridor. No wheezing, rhonchi or rales.  ?Abdominal:  ?   General: Abdomen is flat. Bowel sounds are normal.  ?   Palpations: Abdomen is soft.  ?Musculoskeletal:     ?   General: Normal range of motion.  ?   Cervical back: Normal range of motion.  ?Skin: ?   General: Skin is warm and dry.  ?Neurological:  ?   General: No focal deficit present.  ?   Mental Status: She is alert and oriented to person, place, and time. Mental status is at baseline.  ?Psychiatric:     ?   Mood and Affect: Mood normal.     ?   Behavior: Behavior normal.  ? ? ? ?UC Treatments / Results  ?Labs ?(all labs ordered are listed, but only abnormal results are displayed) ?Labs Reviewed  ?CULTURE, GROUP A STREP [facility])  ?POCT RAPID STREP A (OFFICE)  ? ? ?EKG ? ? ?Radiology ?No results found. ? ?Procedures ?Procedures (including critical care time) ? ?Medications Ordered in UC ?Medications - No data to display ? ?Initial Impression / Assessment and Plan / UC Course  ?I have reviewed the triage vital signs and the nursing notes. ? ?Pertinent labs & imaging results that were available during my care of the patient were reviewed by me and considered in my medical decision making (see chart for details). ? ?  ? ?Rapid strep was negative.  Throat culture is pending.  Posterior pharynx is not suspicious of strep throat at this time.  Suspect possible viral cause of symptoms.  Right middle ear effusion is present.  Will treat with cetirizine and Flonase.  This should be safe with breast-feeding.  Discussed return precautions.  Patient verbalized understanding and was agreeable with plan. ?Final Clinical Impressions(s) / UC Diagnoses  ? ?Final diagnoses:  ?Sore  throat  ?Fluid level behind tympanic membrane of right ear  ? ? ? ?Discharge Instructions   ? ?  ?Your rapid strep was negative. Throat culture is pending. You have been prescribed two medications to alleviate

## 2021-10-20 NOTE — Discharge Instructions (Addendum)
Your rapid strep was negative. Throat culture is pending. You have been prescribed two medications to alleviate ear pain.  ?

## 2021-10-22 LAB — CULTURE, GROUP A STREP (THRC)

## 2021-10-28 NOTE — Progress Notes (Signed)
? Subjective:  ?Patient Name: Grace Wall Date of Birth: [birth year]  MRN: [id number] ? ?Janellen Doakes  presents to the office today for follow-up of her diffuse toxic goiter Berenice Primas' disease), hypothyroidism due to Hashimoto's disease, exophthalmos, iron deficiency anemia, obesity, and third pregnancy. ? ?HISTORY OF PRESENT ILLNESS:  ? ?Mylene is a 36 y.o. [place] young woman. Aeralynn was unaccompanied.  ? ?1. The patient was first referred to me by herself on 05/08/05.  ? A. She was diagnosed with thyroid problems at age 24. At that point she was hyperthyroid, had a big anterior neck, and had big eyes, so she presumably had Graves' disease. She was placed on Tapazole (methimazole). Tapazole was subsequently stopped at about age 72. ? B. About one year later, she became hypothyroid, presumably due to coexistent Hashimoto's disease. She started Synthroid. The Synthroid was changed to generic levothyroxine in 2005. The patient's eyes had gradually improved over time. At that first visit with me, she was feeling fairly well, but was tired a lot. She was also somewhat mentally confused at times.  ? C. On physical examination she had intermittent proptosis bilaterally. The thyroid gland was 20+ grams in size. The right lobe was within normal limits, but the left lobe was slightly enlarged. Laboratory data showed a TSH of 3.09, free T4 1.22, and free T3 of 2.6. Her TPO antibody level was 1171.1, with normals being less than 60. A thyroid-stimulating immunoglobulin level was 64, with normals being <129. The thyroid binding inhibitory immunoglobulin was 96, with normals being less than 17. Her TSH receptor antibody was 87, with normals being less than 10. 2. Given the high levels of the thyroid receptor antibody, it was unclear how her thyroid tests would evolve. Repeat thyroid hormone tests performed on 05/18/2005 were markedly different. At this time the TSH was 0.032,  free T4 was 1.77, and free T3 was 3.9. At that  point it was still unclear whether the fluctuations in thyroid hormone were due to changes in the forms of levothyroxine made by different companies, or to  changes in thyroid receptor antibody levels, or both. I changed her to brand Synthroid at a dose of 150 mcg per day.  ? ?2. During the past 16 years we have followed her for several problems: ? A. We've had to adjust her thyroid hormone doses frequently. Her TSH values have ranged from 0.04 - 8.58. Part of these fluctuations have been due to flare ups of Hashimoto's disease. In addition, because she has not had any health insurance during most of this time, she's often had to rely on thyroid hormone samples or take generic thyroid hormones. Sometimes she's not always had exactly the right thyroid hormone doses. She also appears to have had fluctuations in her thyroid receptor antibodies which have caused changes in her thyroid hormone levels.  Because of her lack of insurance, however, we had not been able to repeat her thyroid receptor antibody testing.  ?B. As noted above, when I first began taking care of her in 2006, I noted that her thyroid tests had been very erratic, most likely due to difficulties in absorbing the generics. I converted her to Synthroid and the erratic variability essentially resolved. However, in subsequent years when she had to take generics due to cost issues, the erratic variability returned. Fortunately, she has been able to resume taking Synthroid in recent years and her thyroid tests have been much more stable. It appears that Ms. Boas is one of the many individuals  that absorb Synthroid much more consistently than they absorb many of the generic levothyroxine preparations. It is medically necessary that she be treated with brand Synthroid.   ? B. She has also had a problem with chronic iron deficiency anemia.  ? C. She has been obese/overweight.  ? D. In June 2016 her HbA1c was 5.9%. In September 2016 her HbA1c was 5.7%. The  four HbA1c tests performed in 2017 were normal, varying from 5.3-5.4%. Due to her lack of insurance, we have not been able to perform this testing as often as I would have preferred. Since 2019 her HbA1c values have varied from 5.1-5.6%. ?E. She is now pregnant. Her EDC is in January 7th, 2023. ? ?3. The patient's last [facility] visit was on 07/17/21. At that visit I continued her Synthroid doses of two of the 112 mcg tablets/day on even-numbered days and 1.5 tablets/day on odd-numbered days. As noted above, she did not absorb generic levothyroxine well when I first began treating her in 2006, so we have tried to keep her on Synthroid ever since.  ? A. In the interim, she has been healthy.  ?B. She delivered her new baby via C-section on 07/25/21. She developed hives soon after receiving the anesthetic.  ?C. Her energy level was low for the first two months after delivery, but her energy level is higher now. She feels "great'.   ?D. About 2-3 weeks ago she began noticing significant numbness in her right palm and a little numbness in her left palm. She occasionally wakes up with numbness, but the hand is not usually numb in the mornings. When she brushes her hair or lifts her arms above her chest, the numbness is worse. When she uses her hands and arms below the chest level the numbness is not as bad or sometimes can even stop. She is not having any pain in her neck or shoulder girdle. In retrospect, she had tingling in both hands about a year ago and had trapezius and nuchal tightness and soreness, posterior headaches, and band-like headaches at her last visit in December 2022. She is not having those symptoms now.                      Marland Kitchen  ?E. She remains on her usual dosage of Synthroid, prenatal vitamins daily, iron daily, and vitamin D once a week. ?F. She is starting to try to eat healthier and to exercise. ?G. She is sleeping better.  ? ?4. Pertinent Review of Systems:  ?Constitutional: The patient feels "great". Her  energy level is "pretty good". Her body temperature is normal. She doesn't want me to change her medication.  ?Eyes: Vision is pretty good, but she sometimes needs her glasses for distance reading. There are no other significant eye complaints. She does not think that one eye is more prominent than the other. When she moves her eyes around she no longer has a sensation of pressure or restriction.  The hives disappeared after being given Benadryl.  ?Neck: The patient has no complaints of anterior neck swelling, soreness, tenderness,  pressure, discomfort, or difficulty swallowing.  ?Heart: Heart rate increases with exercise or other physical activity. The patient has no complaints of palpitations, irregular heat beats, chest pain, or chest pressure. ?Gastrointestinal: She has belly hunger all the time. She is no longer constipated. She no longer has reflux and nausea.  ?Hands: No tremor or other problems ?Legs: Muscle mass and strength seem normal. There are no  complaints of numbness, tingling, burning, or pain. She has not had swelling recently.   ?Feet: There are no obvious foot problems. There are no complaints of numbness, tingling, burning, or pain. No edema is noted. ?GYN: LMP was 10/19/20. She has had some vaginal bleeding while breast feeding.   ?  ?PAST MEDICAL, FAMILY, AND SOCIAL HISTORY: ? ?Past Medical History:  ?Diagnosis Date  ? Exophthalmos   ? Gestational thrombocytopenia ([place])   ? Hypothyroidism, acquired, autoimmune   ? Supervision of low-risk pregnancy 11/28/2015  ?  Clinic  [place] Prenatal Labs Dating  LMP Blood type: O/Positive/-- (05/11 1044)  Genetic Screen 1? Screen: negative   AFP:     Quad:     NIPS: Antibody:Negative (05/11 1044) Anatomic [place]  normal female fetus Rubella: 30.10 (05/11 1044) GTT Third trimester: normal RPR: Non Reactive (10/11 1443)  Flu vaccine  05/11/16 HBsAg: Negative (05/11 1044)  TDaP vaccine   05/11/16                              HIV: Non Reactive (10/11 1443)  Baby  Food  breast                          GBS:  Contraception undecided Pap: Negative (08/29/2015) Circumcision N/a female  Pediatrician  [place] husband     ? Thyroiditis, autoimmune   ? ?

## 2021-10-29 ENCOUNTER — Encounter (INDEPENDENT_AMBULATORY_CARE_PROVIDER_SITE_OTHER): Payer: Self-pay | Admitting: "Endocrinology

## 2021-10-29 ENCOUNTER — Ambulatory Visit (INDEPENDENT_AMBULATORY_CARE_PROVIDER_SITE_OTHER): Payer: Medicaid Other | Admitting: "Endocrinology

## 2021-10-29 VITALS — BP 120/70 | HR 72 | Wt 182.0 lb

## 2021-10-29 DIAGNOSIS — R7303 Prediabetes: Secondary | ICD-10-CM

## 2021-10-29 DIAGNOSIS — E049 Nontoxic goiter, unspecified: Secondary | ICD-10-CM | POA: Diagnosis not present

## 2021-10-29 DIAGNOSIS — E559 Vitamin D deficiency, unspecified: Secondary | ICD-10-CM

## 2021-10-29 DIAGNOSIS — D508 Other iron deficiency anemias: Secondary | ICD-10-CM

## 2021-10-29 DIAGNOSIS — R7401 Elevation of levels of liver transaminase levels: Secondary | ICD-10-CM

## 2021-10-29 DIAGNOSIS — E063 Autoimmune thyroiditis: Secondary | ICD-10-CM

## 2021-10-29 DIAGNOSIS — R5383 Other fatigue: Secondary | ICD-10-CM

## 2021-10-29 DIAGNOSIS — R200 Anesthesia of skin: Secondary | ICD-10-CM

## 2021-10-29 NOTE — Patient Instructions (Signed)
Follow up visit in 3 months.   At [facility], we are committed to providing exceptional care. You will receive a patient satisfaction survey through text or email regarding your visit today. Your opinion is important to me. Comments are appreciated.   

## 2021-11-11 ENCOUNTER — Other Ambulatory Visit (INDEPENDENT_AMBULATORY_CARE_PROVIDER_SITE_OTHER): Payer: Self-pay | Admitting: "Endocrinology

## 2021-11-20 LAB — COMPREHENSIVE METABOLIC PANEL
AG Ratio: 1.4 (calc) (ref 1.0–2.5)
ALT: 75 U/L — ABNORMAL HIGH (ref 6–29)
AST: 40 U/L — ABNORMAL HIGH (ref 10–30)
Albumin: 4.4 g/dL (ref 3.6–5.1)
Alkaline phosphatase (APISO): 50 U/L (ref 31–125)
BUN: 17 mg/dL (ref 7–25)
CO2: 29 mmol/L (ref 20–32)
Calcium: 10.1 mg/dL (ref 8.6–10.2)
Chloride: 103 mmol/L (ref 98–110)
Creat: 0.69 mg/dL (ref 0.50–0.97)
Globulin: 3.1 g/dL (calc) (ref 1.9–3.7)
Glucose, Bld: 95 mg/dL (ref 65–139)
Potassium: 4 mmol/L (ref 3.5–5.3)
Sodium: 140 mmol/L (ref 135–146)
Total Bilirubin: 0.3 mg/dL (ref 0.2–1.2)
Total Protein: 7.5 g/dL (ref 6.1–8.1)

## 2021-11-20 LAB — VITAMIN D 25 HYDROXY (VIT D DEFICIENCY, FRACTURES): Vit D, 25-Hydroxy: 22 ng/mL — ABNORMAL LOW (ref 30–100)

## 2021-11-20 LAB — FOLATE: Folate: >24 ng/mL

## 2021-11-20 LAB — TSH: TSH: 0.68 mIU/L

## 2021-11-20 LAB — VITAMIN B12: Vitamin B-12: 444 pg/mL (ref 200–1100)

## 2021-11-20 LAB — HEMOGLOBIN A1C
Hgb A1c MFr Bld: 5.6 % of total Hgb (ref <5.7)
Mean Plasma Glucose: 114 mg/dL
eAG (mmol/L): 6.3 mmol/L

## 2021-11-20 LAB — T4, FREE: Free T4: 1.2 ng/dL (ref 0.8–1.8)

## 2021-11-20 LAB — T3, FREE: T3, Free: 3.2 pg/mL (ref 2.3–4.2)

## 2022-01-07 ENCOUNTER — Other Ambulatory Visit: Payer: Self-pay

## 2022-01-07 ENCOUNTER — Other Ambulatory Visit: Payer: Self-pay | Admitting: Gastroenterology

## 2022-01-07 DIAGNOSIS — R7989 Other specified abnormal findings of blood chemistry: Secondary | ICD-10-CM

## 2022-01-07 DIAGNOSIS — R1011 Right upper quadrant pain: Secondary | ICD-10-CM

## 2022-01-08 ENCOUNTER — Ambulatory Visit
Admission: RE | Admit: 2022-01-08 | Discharge: 2022-01-08 | Disposition: A | Discharge status: Home / Self Care | Payer: Medicaid Other | Source: Ambulatory Visit | Attending: Gastroenterology | Admitting: Gastroenterology

## 2022-01-08 DIAGNOSIS — R7989 Other specified abnormal findings of blood chemistry: Secondary | ICD-10-CM

## 2022-01-08 DIAGNOSIS — R1011 Right upper quadrant pain: Secondary | ICD-10-CM

## 2022-01-28 NOTE — Progress Notes (Deleted)
Subjective:  Patient Name: Grace Wall Date of Birth: 02/13/1986  MRN: [id number]  Grace Wall  presents to the office today for follow-up of her diffuse toxic goiter Grace Wall), hypothyroidism due to Hashimoto's Wall, exophthalmos, iron deficiency anemia, obesity, and third pregnancy.  HISTORY OF PRESENT ILLNESS:   Grace Wall is a 36 y.o. [place] young woman. Grace Wall was unaccompanied.   1. The patient was first referred to me by herself on 05/08/05.   A. She was diagnosed with thyroid problems at age 22. At that point she was hyperthyroid, had a big anterior neck, and had big eyes, so she presumably had Graves' Wall. She was placed on Tapazole (methimazole). Tapazole was subsequently stopped at about age 37.  B. About one year later, she became hypothyroid, presumably due to coexistent Hashimoto's Wall. She started Synthroid. The Synthroid was changed to generic levothyroxine in 2005. The patient's eyes had gradually improved over time. At that first visit with me, she was feeling fairly well, but was tired a lot. She was also somewhat mentally confused at times.   C. On physical examination she had intermittent proptosis bilaterally. The thyroid gland was 20+ grams in size. The right lobe was within normal limits, but the left lobe was slightly enlarged. Laboratory data showed a TSH of 3.09, free T4 1.22, and free T3 of 2.6. Her TPO antibody level was 1171.1, with normals being less than 60. A thyroid-stimulating immunoglobulin level was 64, with normals being <129. The thyroid binding inhibitory immunoglobulin was 96, with normals being less than 17. Her TSH receptor antibody was 87, with normals being less than 10. 2. Given the high levels of the thyroid receptor antibody, it was unclear how her thyroid tests would evolve. Repeat thyroid hormone tests performed on 05/18/2005 were markedly different. At this time the TSH was 0.032,  free T4 was 1.77, and free T3 was 3.9. At that  point it was still unclear whether the fluctuations in thyroid hormone were due to changes in the forms of levothyroxine made by different companies, or to  changes in thyroid receptor antibody levels, or both. I changed her to brand Synthroid at a dose of 150 mcg per day.   2. During the past 16 years we have followed her for several problems:  A. We've had to adjust her thyroid hormone doses frequently. Her TSH values have ranged from 0.04 - 8.58. Part of these fluctuations have been due to flare ups of Hashimoto's Wall. In addition, because she has not had any health insurance during most of this time, she's often had to rely on thyroid hormone samples or take generic thyroid hormones. Sometimes she's not always had exactly the right thyroid hormone doses. She also appears to have had fluctuations in her thyroid receptor antibodies which have caused changes in her thyroid hormone levels.  Because of her lack of insurance, however, we had not been able to repeat her thyroid receptor antibody testing.  B. As noted above, when I first began taking care of her in 2006, I noted that her thyroid tests had been very erratic, most likely due to difficulties in absorbing the generics. I converted her to Synthroid and the erratic variability essentially resolved. However, in subsequent years when she had to take generics due to cost issues, the erratic variability returned. Fortunately, she has been able to resume taking Synthroid in recent years and her thyroid tests have been much more stable. It appears that [name] is one of the many individuals  that absorb Synthroid much more consistently than they absorb many of the generic levothyroxine preparations. It is medically necessary that she be treated with brand Synthroid.    B. She has also had a problem with chronic iron deficiency anemia.   C. She has been obese/overweight.   D. In June 2016 her HbA1c was 5.9%. In September 2016 her HbA1c was 5.7%. The  four HbA1c tests performed in 2017 were normal, varying from 5.3-5.4%. Due to her lack of insurance, we have not been able to perform this testing as often as I would have preferred. Since 2019 her HbA1c values have varied from 5.1-5.6%. E. She is now pregnant. Her EDC is in January 7th, 2023.  3. The patient's last [facility] visit was on 10/29/21. At that visit I continued her Synthroid doses of two of the 112 mcg tablets/day on even-numbered days and 1.5 tablets/day on odd-numbered days. As noted above, she did not absorb generic levothyroxine well when I first began treating her in 2006, so we have tried to keep her on Synthroid ever since.   A. In the interim, she has been healthy.  B. She delivered her new baby via C-section on 07/25/21. She developed hives soon after receiving the anesthetic.  C. Her energy level was low for the first two months after delivery, but her energy level is higher now. She feels "great'.   D. About 2-3 weeks ago she began noticing significant numbness in her right palm and a little numbness in her left palm. She occasionally wakes up with numbness, but the hand is not usually numb in the mornings. When she brushes her hair or lifts her arms above her chest, the numbness is worse. When she uses her hands and arms below the chest level the numbness is not as bad or sometimes can even stop. She is not having any pain in her neck or shoulder girdle. In retrospect, she had tingling in both hands about a year ago and had trapezius and nuchal tightness and soreness, posterior headaches, and band-like headaches at her last visit in December 2022. She is not having those symptoms now.                      .  E. She remains on her usual dosage of Synthroid, prenatal vitamins daily, iron daily, and vitamin D once a week. F. She is starting to try to eat healthier and to exercise. G. She is sleeping better.   4. Pertinent Review of Systems:  Constitutional: The patient feels "great". Her  energy level is "pretty good". Her body temperature is normal. She doesn't want me to change her medication.  Eyes: Vision is pretty good, but she sometimes needs her glasses for distance reading. There are no other significant eye complaints. She does not think that one eye is more prominent than the other. When she moves her eyes around she no longer has a sensation of pressure or restriction.  The hives disappeared after being given Benadryl.  Neck: The patient has no complaints of anterior neck swelling, soreness, tenderness,  pressure, discomfort, or difficulty swallowing.  Heart: Heart rate increases with exercise or other physical activity. The patient has no complaints of palpitations, irregular heat beats, chest pain, or chest pressure. Gastrointestinal: She has belly hunger all the time. She is no longer constipated. She no longer has reflux and nausea.  Hands: No tremor or other problems Legs: Muscle mass and strength seem normal. There are no  complaints of numbness, tingling, burning, or pain. She has not had swelling recently.   Feet: There are no obvious foot problems. There are no complaints of numbness, tingling, burning, or pain. No edema is noted. GYN: LMP was 10/19/20. She has had some vaginal bleeding while breast feeding.     PAST MEDICAL, FAMILY, AND SOCIAL HISTORY:  Past Medical History:  Diagnosis Date   Exophthalmos    Gestational thrombocytopenia (HCC)    Hypothyroidism, acquired, autoimmune    Supervision of low-risk pregnancy 11/28/2015    Clinic  [facility] [place] Prenatal Labs Dating  LMP Blood type: O/Positive/-- (05/11 1044)  Genetic Screen 1 Screen: negative   AFP:     Quad:     NIPS: Antibody:Negative (05/11 1044) Anatomic [place]  normal female fetus Rubella: 30.10 (05/11 1044) GTT Third trimester: normal RPR: Non Reactive (10/11 1443)  Flu vaccine  05/11/16 HBsAg: Negative (05/11 1044)  TDaP vaccine   05/11/16                              HIV: Non Reactive (10/11 1443)  Baby  Food  breast                          GBS:  Contraception undecided Pap: Negative (08/29/2015) Circumcision N/a female  Pediatrician  [facility]  Support Person husband      Thyroiditis, autoimmune     Family History  Problem Relation Age of Onset   Diabetes Mother    Hypertension Father    Thyroid Wall Son    Cancer Maternal Aunt      Current Outpatient Medications:    Calcium Carbonate Antacid (MAALOX) 600 MG chewable tablet, Chew 1 tablet (600 mg total) by mouth 2 (two) times daily., Disp: 20 tablet, Rfl: 1   cetirizine (ZYRTEC) 10 MG tablet, Take 1 tablet (10 mg total) by mouth daily for 10 days., Disp: 30 tablet, Rfl: 0   Cholecalciferol (VITAMIN D) 125 MCG (5000 UT) CAPS, Take 5,000 Units by mouth 2 (two) times a week. Biotech, Disp: , Rfl:    docusate sodium (COLACE) 100 MG capsule, Take 1 capsule (100 mg total) by mouth daily., Disp: 12 capsule, Rfl: 0   ferrous sulfate (FERROUSUL) 325 (65 FE) MG tablet, Take 1 pill daily (Patient taking differently: 325 mg daily with breakfast.), Disp: 90 tablet, Rfl: 3   fluticasone (FLONASE) 50 MCG/ACT nasal spray, Place 1 spray into both nostrils daily for 3 days., Disp: 16 g, Rfl: 0   oxyCODONE-acetaminophen (PERCOCET) 5-325 MG tablet, Take 1-2 tablets by mouth every 6 (six) hours as needed for severe pain., Disp: 28 tablet, Rfl: 0   Prenatal Vit-Fe Fumarate-FA (PRENATAL MULTIVITAMIN) TABS tablet, Take 1 tablet by mouth daily at 12 noon., Disp: , Rfl:    SYNTHROID 112 MCG tablet, Take two 112 mcg tablets per day on even-numbered days and 1.5 tablets on odd-numbered days., Disp: 45 tablet, Rfl: 6  Allergies as of 01/29/2022   (No Known Allergies)    1. 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 She now has Medicaid through January 2024. Her mother now lives in the area.  2. Activities: She stays busy with child care.  3. Smoking, alcohol, or drugs: None 4. Primary Care Provider; Horton Marshall,  MD, [facility], office [phone] 5. OB: [facility]  REVIEW OF SYSTEMS: There  are no other significant problems involving the patient's other body systems.   Objective:  Vital Signs:  There were no vitals taken for this visit.   Wt Readings from Last 3 Encounters:  10/29/21 182 lb (82.6 kg)  10/20/21 160 lb (72.6 kg)  07/25/21 200 lb (90.7 kg)   PHYSICAL EXAM: Constitutional: The patient appears healthy, but overweight. She is alert, bright, and happy today. She has normal affect and insight today.  Eyes:  There is no obvious arcus. Her right eye is very slightly more prominent than the left, but neither eye appears unusually prominent. She has no inferior proptosis today,  Extraocular movements of her eyes are normal. Upward and lateral movements of the eyes are no longer restricted. She feels no sense of eye muscle pressure when looking upward and to the right or upward and to the left. Moisture appears normal. Mouth: The oropharynx and tongue appear normal. Oral moisture is normal. There is no hyperpigmentation.  Neck: The neck appears to be visibly normal. No carotid bruits are noted. The thyroid gland is mildly enlarged at about 21 grams. Today both lobes are symmetrically enlarged. The consistency of the thyroid gland is somewhat full. The thyroid gland is not tender to palpation. The shoulder girdle is not tight and tender. Her cervical spine range of motion is pretty good. Lungs: The lungs are clear to auscultation. Air movement is good. Heart: Heart rate and rhythm are regular. Heart sounds S1 and S2 are normal. I did not appreciate any pathologic cardiac murmurs or heart sounds. Abdomen: The abdomen is more enlarged. Bowel sounds are normal. There is no obvious hepatomegaly, splenomegaly, or other mass effect.  Arms: Muscle size and bulk are normal for age.  Hands: There is no tremor. Phalangeal and metacarpophalangeal joints are normal. Palmar muscles are  normal for age. Palmar skin shows trace palmar erythema. Palmar moisture is normal. There is no hyperpigmentation. I do not see any nail pallor today.   Legs: Muscles appear normal for age. No edema is present. Neurologic: Strength is normal for age in both the upper and lower extremities. Muscle tone is normal. Sensation to touch is normal in both legs.    LAB DATA:   Labs 11/19/21: HbA1c 5.6%; TSH 0.68, free T4 1.2, free T3 3.2; CMP normal, except AST 40 (ref 10-300 and ALT 75 (ref 6-29); folate >24 (ref >5.4); B12 444 (ref [phone]) ; 25-OH vitamin D 22  Labs 4/03/223: Rapid strep negative; strep culture negative  Labs 10/10/21: TSH 0.34, free T4 0.9, free T3 3.0; CMP normal, except AST 42 (ref 10-30) and ALT 76 (ref 6-29); PTH 41 (ref 16-77), calcium 9.4, 25-OH vitamin D 28  Labs 07/17/21: HbA1c 5.4%, CBG 138  Labs 07/16/21; TSH 3.34, free T4 0.9, free T3 1.9; CMP normal, except albumin 3.5 (ref 3.6-5.1); CBC normal, except platelets 100 (ref 140-400); iron 85 (ref 40-190); PTH pending, calcium 9.1, 25-OH vitamin D 22  Labs 05/14/21: HbA1c 5.1%, CBG 102  Labs 05/13/21: TSH 1.26, free T4 1.1, free T3 2.6; CBC normal, except platelets 109 (ref 140-400); RBC number, Hgb, and HCT are normal, but low-normal; iron 80; 25-OH vitamin D 23  Labs 02/24/21: HbA1c 5.1%; TSH 3.53, free T4 1.0, free T3 2.3; CBC normal, except RBC 3.79 (ref 3.80-5.10), Hgb 11.4 (ref 11.7-15.5), and Hct 34.2 (ref 35-45), and platelets 124 (ref 140-400); iron 96 (ref 40-190)  Labs 12/23/20: HbA1c 5.4%; TSH 2.30, free T4 1.0, free T3 2.3; CBC normal, except RDW 15.5 (  ref 11-15) and platelets 133 (ref 140-400); iron 70 (ref 40-190)  Labs 08/07/19: CBC normal; iron 67 (ref 40-190)  Labs 04/10/19: HbA1c 5.6%; TSH 0.51, free T4 1.3, free T3 3.2; CBC normal, except Hgb 9.5 (ref 11.7-15.5), Hct 29.8 (ref 35-45), MCV 74.1 (ref 80-100), MCH 23.6 (ref 27-33), MCHC 31.9 (ref 32-36); iron 24 (ref 40-190)  Labs 08/23/18: TSH 8.83  Labs  01/06/18: TSH 0.83, free T4 1.4, free T3 3.1  Labs 1205/18: TSH 0.05, free T4 1.3, free T3 3.4  Labs 06/17/16: HbA1c 5.4%, CBG 103  Labs 06/15/16: TSH 0.26, free T4 1.2, free T3 2.3  Labs 05/07/16: TSH 0.47, free T4 1.1, free T3 2.5  8//28/17: TSH 3.54, free T4 1.2, free T3 2.2; HbA1c 5.4%  02/04/16: CBG 92 after breakfast, HbA1c 5.4%  02/03/16: TSH 5.85, free T4 1.0, free T3 1.8  12/04/15: TSH 3.52, free T4 1.1, free T3 2.2; iron 51; HbA1c 5.3%  11/28/15: TSH 2.330; CBC normal except for MCH of 26.7 (normal 27-33)  11/18/15: Positive pregnancy test  04/15/15: HbA1c 5.7%; Hgb 11.7, Hct 35.7, iron 36 (normal 40-190); TSH 1.151, free T4 1.08, free T3 2.8  01/10/15: HbA1c 5.9%; Hgb 11.9, Hct 36.3, iron 43; TSH 2.853, free T4 0.83, free T3 2.5  10/15/14: Hgb 11.3, Hct 35.6, MCH 25.6,Iron 24; TSH 2.549, free T4 0.99, free T3 2.4  04/13/14: Iron 24; Hgb 11.1, Hct 32.6%, MCV 74.9; TSH 0.122, free T4 1.45, free T3 3.3  08/04/12: TSH 0.824, free T4 1.26, free T3 3.4  02/01/13: Iron 84, Hgb 11.8, Hct 34.8%  01/30/13; TSH 1.397, free T4 1.44, free T3 2.8  08/02/12: TSH 3.078, free T4 1.14, free T3 2.7  12/15/11: TSH was 0.520, free T4 1.40, free T3  2.7   Assessment and Plan:   ASSESSMENT: 1. Hypothyroid/hyperthyroid:   A. During the past 16 years, Ayeshia TFTs have varied over time with her pregnancies, other health conditions, and levothyroxine/Synthroid doses. We have tried to keep her TSH in the ideal goal range of 1.0-2.0.   B. Her TFTs in September 2020 were at the upper end of the reference range. Her TFTs in June 2022 were normal, but at about the 25% of the physiologic range. Her TFTs in August 2022 were lower, so I increased her Synthroid dose. It appears that her placenta was excessively metabolizing her Synthroid.  C. Her TFTs in October 2022 were mid-normal.  D. Her TFTs in December 2022 were borderline low. This decrease in TFTs was due in part to her placenta breaking down  thyroid hormone, but also due in part to missing some doses recently. E. Her TFTs in March 2023 were puzzling. The decrease in TSH could certainly have been due to her Synthroid being too high, but her free T4 was low-normal and her free T3 was not unusually high. She may behaving a flare up of thyroiditis. She is clinically euthyroid or mildly hyperthyroid.  2. Thyroiditis: Her Hashimoto's Wall is clinically quiescent today.  3. Goiter: Her thyroid gland is enlarged again today and the lobes have shifted in size again. The process of waxing and waning of thyroid gland size is c/w evolving thyroiditis. 4. Exophthalmos: The eyes are within normal limits in terms of prominence again today,. I do not see any restriction of eye movement today. She does not have any sense of pressure behind the either eye with upward and rightward gaze.  5. Overweight: Her weight has increased too much.  She needs to Eat Right  and to walk an hour a day or perform equivalent exercise.  6-9. Fatigue/pallor/coldness/iron deficiency anemia:   A. Her pallor and iron deficiency anemia had resolved after resuming iron treatment. Unfortunately, In August 2022 she was anemic again, even with a normal iron content.   B. Her RBC indices were better in October and December 2022. Her iron was also good in December. She needs to continue to take her iron.    10. Hypotension: Her BP is good. Her dizziness has resolved.  11. Pre-diabetes: Her HbA1c was in the prediabetic range in June and September 2016, was 5.3% at her June 2017 visit, but had decreased to 5.3% in May 2017, then increased to 5.4% at her visits in July, August, and November 2017. Her HbA1c in September 2020 had increased to 5.6%, but had decreased to 5.4% in June 2022. Her HbA1c in August 2022 and in October 2022 was mid-normal at 5.1%.  Her HbA1c in December 2022 had increased to 5.4%, c/w the insulin resistance that occurs in the third trimester of pregnancy.   12.  Vitamin D deficiency: She is still deficient in vitamin D, In part due to placental transfer of her calcium and vitamin D to the fetus. She needs to take Biotech, one 50,000 IU capsule, twice weekly for the next two weeks.   13. Numbness and tingling in the hands, mostly in the right palm: I do not see a particular easily discernible cause. She has had somewhat similar neuropathy in the past. 14. Elevated transaminase: She did not have elevated LFTs in December, but did have these elevations in march, I wonder if this is delayed reaction to anesthesia.   PLAN: 1. Diagnostic: Repeat her HbA1c, TFTs, CMP, B12, 25-OH vitamin D, and folate next week. Consider referral to neurology.  2. Therapeutic: Take Synthroid doses of 2 of the 112 mcg tablets per day on even-numbered days and 1.5 tablets per day on odd-numbered days, but double up on doses for the next three days. Take MVI about dinner time. Take one Biotech 50,000 IU per week, except twice weekly for the next two weeks.   3. Patient education: We discussed her lab results and the need to follow her closely.  4. Follow-up: 3 months  Level of Service: This visit lasted in excess of 70 minutes. More than 50% of the visit was devoted to counseling.  Molli KnockMichael Elleigh Cassetta, MD, CDE Adult and Pediatric Endocrinology

## 2022-01-29 ENCOUNTER — Ambulatory Visit (INDEPENDENT_AMBULATORY_CARE_PROVIDER_SITE_OTHER): Payer: Medicaid Other | Admitting: "Endocrinology

## 2022-01-30 ENCOUNTER — Other Ambulatory Visit: Payer: Self-pay | Admitting: "Endocrinology

## 2022-02-10 ENCOUNTER — Other Ambulatory Visit (INDEPENDENT_AMBULATORY_CARE_PROVIDER_SITE_OTHER): Payer: Self-pay

## 2022-02-10 DIAGNOSIS — R7401 Elevation of levels of liver transaminase levels: Secondary | ICD-10-CM

## 2022-02-10 DIAGNOSIS — E559 Vitamin D deficiency, unspecified: Secondary | ICD-10-CM

## 2022-02-10 DIAGNOSIS — D508 Other iron deficiency anemias: Secondary | ICD-10-CM

## 2022-02-10 DIAGNOSIS — R200 Anesthesia of skin: Secondary | ICD-10-CM

## 2022-02-10 DIAGNOSIS — E063 Autoimmune thyroiditis: Secondary | ICD-10-CM

## 2022-02-10 DIAGNOSIS — D500 Iron deficiency anemia secondary to blood loss (chronic): Secondary | ICD-10-CM

## 2022-02-10 DIAGNOSIS — R231 Pallor: Secondary | ICD-10-CM

## 2022-02-10 DIAGNOSIS — E049 Nontoxic goiter, unspecified: Secondary | ICD-10-CM

## 2022-02-11 ENCOUNTER — Telehealth (INDEPENDENT_AMBULATORY_CARE_PROVIDER_SITE_OTHER): Payer: Self-pay

## 2022-02-11 ENCOUNTER — Encounter (INDEPENDENT_AMBULATORY_CARE_PROVIDER_SITE_OTHER): Payer: Self-pay

## 2022-02-11 LAB — IRON: Iron: 120 ug/dL (ref 40–190)

## 2022-02-11 LAB — T3, FREE: T3, Free: 3.5 pg/mL (ref 2.3–4.2)

## 2022-02-11 LAB — TSH: TSH: 0.2 mIU/L — ABNORMAL LOW

## 2022-02-11 LAB — T4, FREE: Free T4: 1.2 ng/dL (ref 0.8–1.8)

## 2022-02-11 NOTE — Telephone Encounter (Signed)
Called. Lvm with call back number.  

## 2022-02-11 NOTE — Progress Notes (Signed)
TSH is too low, indicating she does not need as much thyroid hormone. Please change her 112 mcg dosage to 2 tablets per day on two days each week, such as Wednesdays and Sundays. Please change her 1.5 tablets per day to 5 days each week.

## 2022-02-19 ENCOUNTER — Encounter (INDEPENDENT_AMBULATORY_CARE_PROVIDER_SITE_OTHER): Payer: Self-pay

## 2022-02-25 ENCOUNTER — Encounter (INDEPENDENT_AMBULATORY_CARE_PROVIDER_SITE_OTHER): Payer: Self-pay

## 2022-05-08 ENCOUNTER — Other Ambulatory Visit: Payer: Self-pay | Admitting: Surgery

## 2023-01-31 IMAGING — US US ABDOMEN LIMITED
1 series · 14 of 25 positions shown · non-contrast
Comparison: None Available.

CLINICAL DATA: Right upper quadrant abdomen pain, elevated liver
function tests.

EXAM:
ULTRASOUND ABDOMEN LIMITED RIGHT UPPER QUADRANT

[Series 1: us abdomen limited · 0.22mm/px · 14 of 47 slices shown]
[im 1/47]
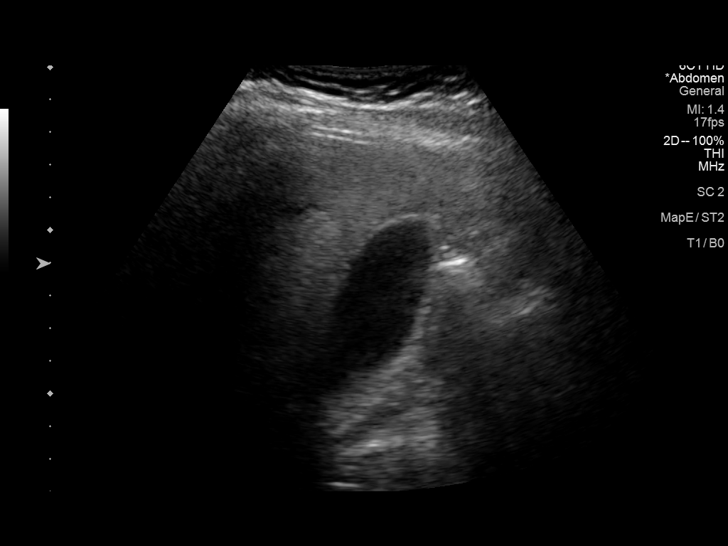
[im 4/47]
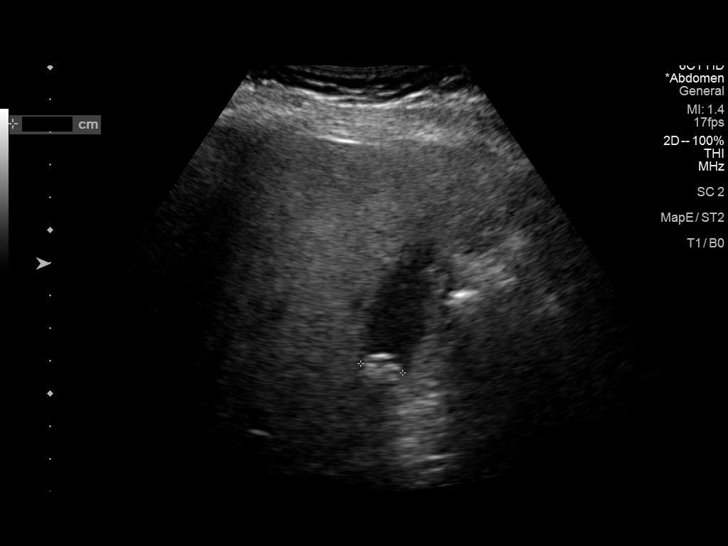
[im 8/47]
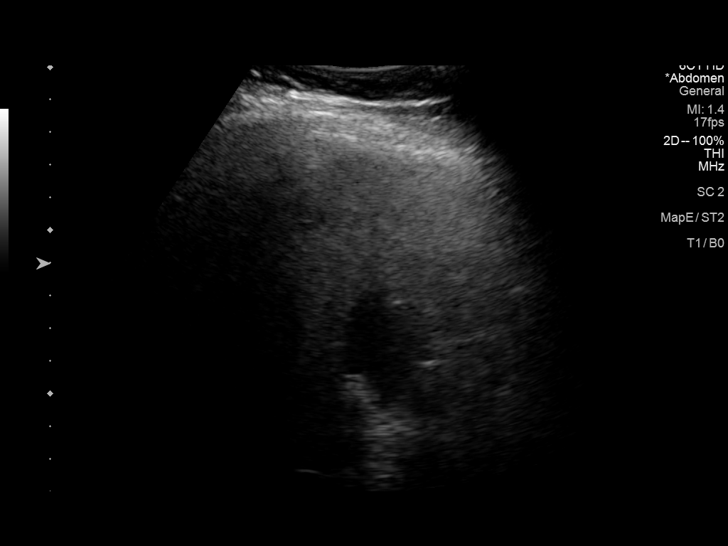
[im 12/47]
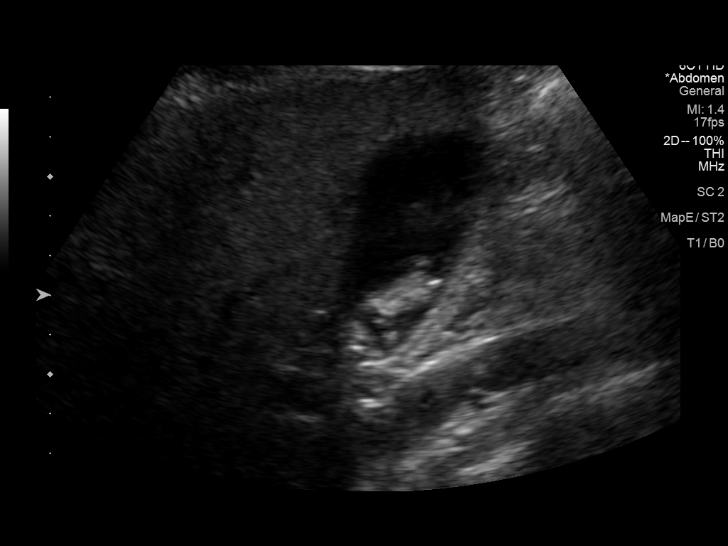
[im 16/47]
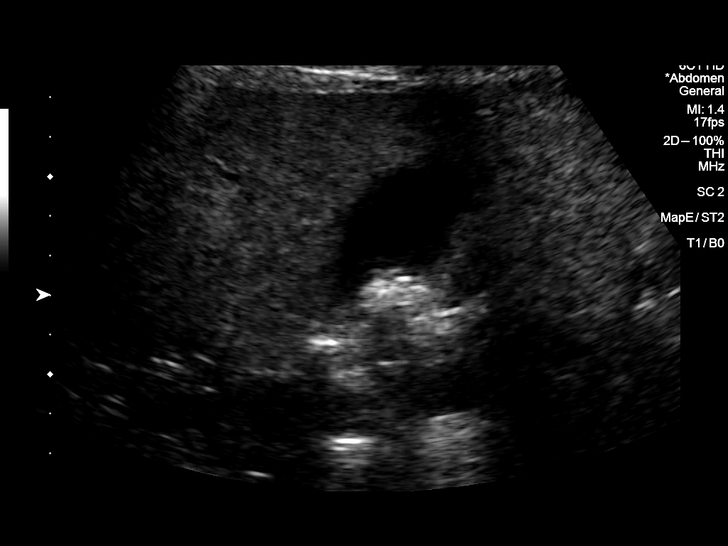
[im 18/47]
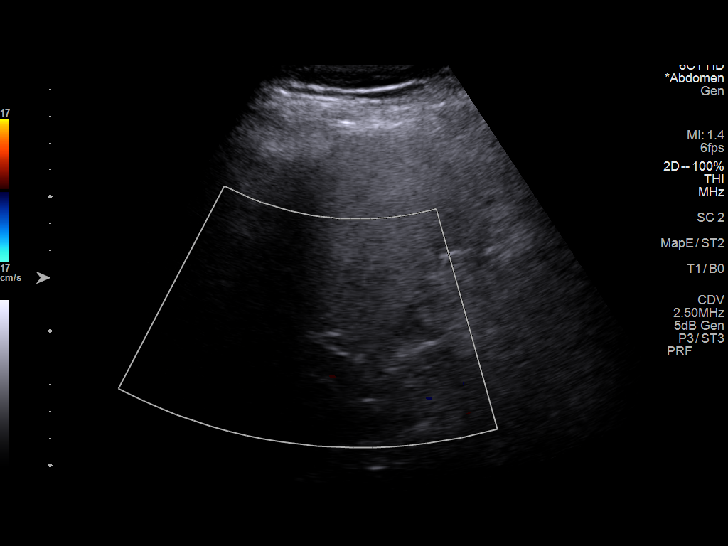
[im 22/47]
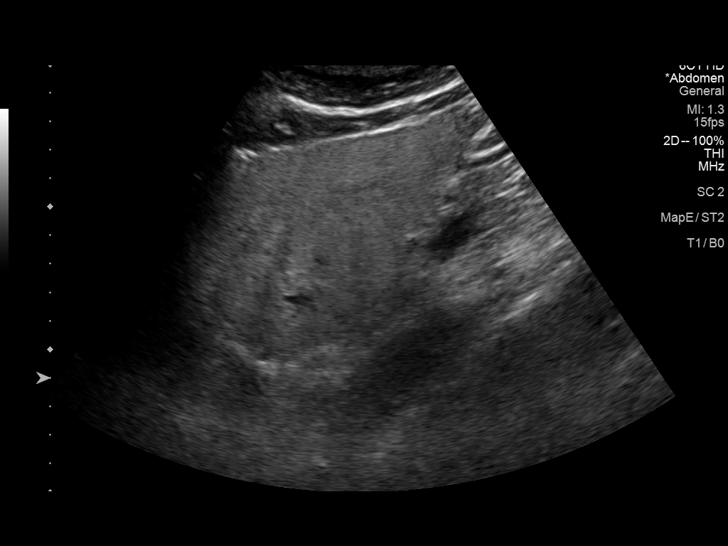
[im 25/47]
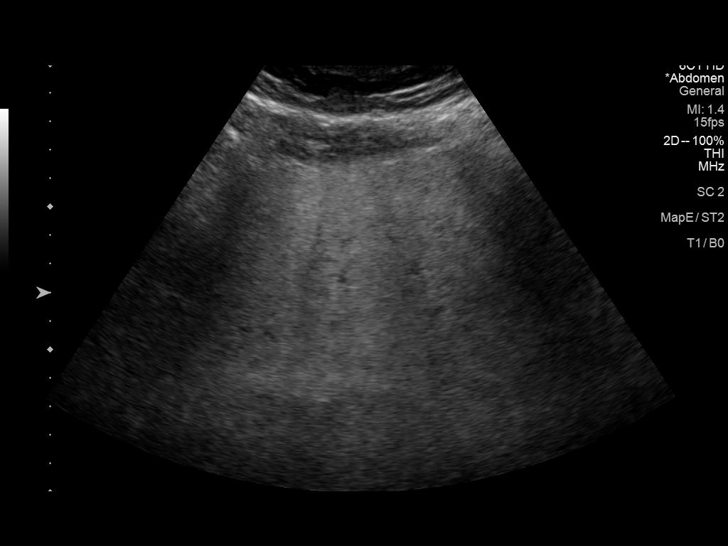
[im 29/47]
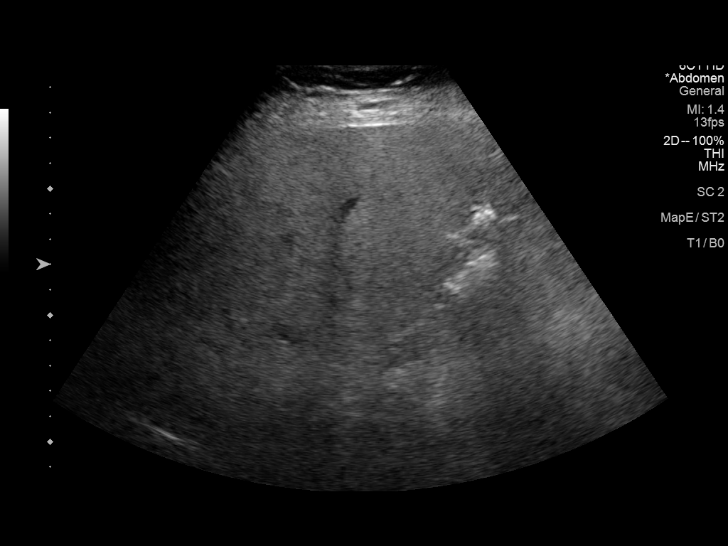
[im 31/47]
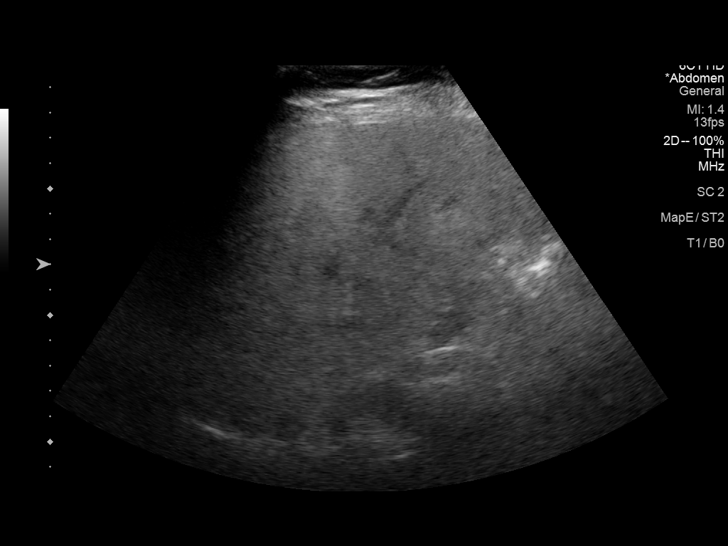
[im 35/47]
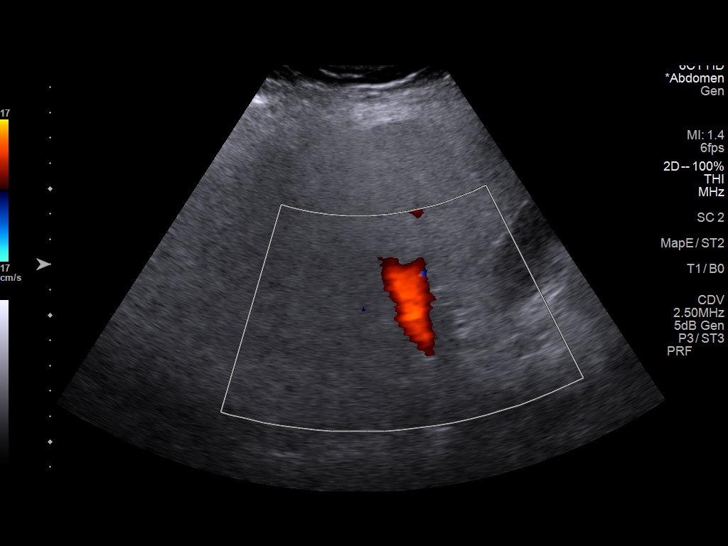
[im 39/47]
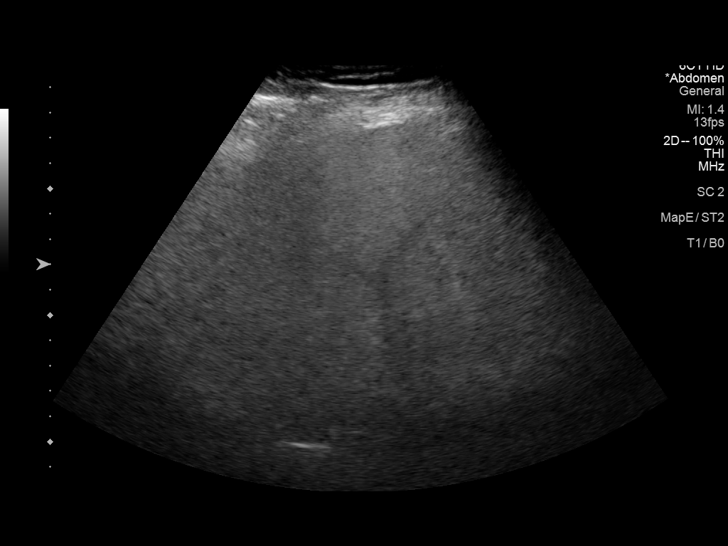
[im 43/47]
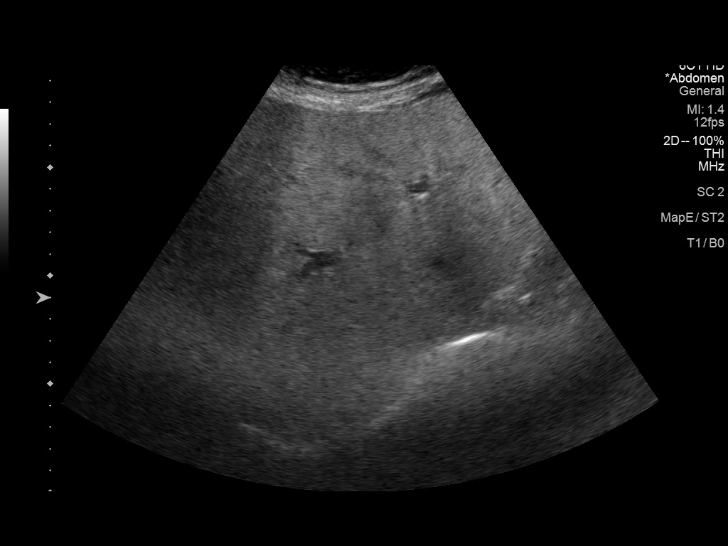
[im 47/47]
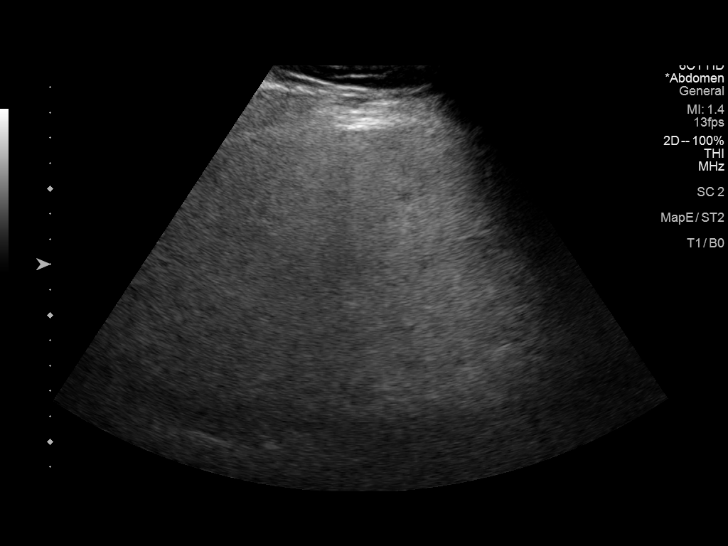

[14 of 25 positions shown; findings below may reference images not displayed]

FINDINGS: Gallbladder:

No wall thickening visualized. Gallstones are noted the gallbladder.
No sonographic Murphy sign noted by sonographer.

Common bile duct:

Diameter: 4.3 mm

Liver:

No focal lesion identified. Diffuse increased echotexture of liver
is noted. Portal vein is patent on color Doppler imaging with normal
direction of blood flow towards the liver.

Other: None.
IMPRESSION: 1. Cholelithiasis without sonographic evidence acute cholecystitis.
2. Diffuse increased echotexture of the liver, nonspecific but can
be seen in fatty infiltration of liver.

## 2023-05-27 ENCOUNTER — Encounter (HOSPITAL_COMMUNITY): Payer: Self-pay

## 2023-05-27 ENCOUNTER — Other Ambulatory Visit: Payer: Self-pay

## 2023-05-27 ENCOUNTER — Emergency Department (HOSPITAL_COMMUNITY)
Admission: EM | Admit: 2023-05-27 | Discharge: 2023-05-28 | Disposition: A | Discharge status: Home / Self Care | Payer: Commercial Managed Care - PPO | Attending: Emergency Medicine | Admitting: Emergency Medicine

## 2023-05-27 DIAGNOSIS — Z79899 Other long term (current) drug therapy: Secondary | ICD-10-CM | POA: Insufficient documentation

## 2023-05-27 DIAGNOSIS — K2900 Acute gastritis without bleeding: Secondary | ICD-10-CM

## 2023-05-27 DIAGNOSIS — K2960 Other gastritis without bleeding: Secondary | ICD-10-CM | POA: Diagnosis not present

## 2023-05-27 DIAGNOSIS — R109 Unspecified abdominal pain: Secondary | ICD-10-CM | POA: Diagnosis present

## 2023-05-27 DIAGNOSIS — E039 Hypothyroidism, unspecified: Secondary | ICD-10-CM | POA: Diagnosis not present

## 2023-05-27 LAB — COMPREHENSIVE METABOLIC PANEL
ALT: 67 U/L — ABNORMAL HIGH (ref 0–44)
AST: 129 U/L — ABNORMAL HIGH (ref 15–41)
Albumin: 3.8 g/dL (ref 3.5–5.0)
Alkaline Phosphatase: 77 U/L (ref 38–126)
Anion gap: 10 (ref 5–15)
BUN: 11 mg/dL (ref 6–20)
CO2: 23 mmol/L (ref 22–32)
Calcium: 8.8 mg/dL — ABNORMAL LOW (ref 8.9–10.3)
Chloride: 103 mmol/L (ref 98–111)
Creatinine, Ser: 0.73 mg/dL (ref 0.44–1.00)
GFR, Estimated: >60 mL/min (ref >60)
Glucose, Bld: 123 mg/dL — ABNORMAL HIGH (ref 70–99)
Potassium: 3.7 mmol/L (ref 3.5–5.1)
Sodium: 136 mmol/L (ref 135–145)
Total Bilirubin: 0.6 mg/dL (ref <1.2)
Total Protein: 6.8 g/dL (ref 6.5–8.1)

## 2023-05-27 LAB — URINALYSIS, ROUTINE W REFLEX MICROSCOPIC
Bilirubin Urine: NEGATIVE
Glucose, UA: NEGATIVE mg/dL
Ketones, ur: NEGATIVE mg/dL
Leukocytes,Ua: NEGATIVE
Nitrite: NEGATIVE
Protein, ur: NEGATIVE mg/dL
Specific Gravity, Urine: 1.025 (ref 1.005–1.030)
pH: 5 (ref 5.0–8.0)

## 2023-05-27 LAB — LIPASE, BLOOD: Lipase: 36 U/L (ref 11–51)

## 2023-05-27 LAB — CBC WITH DIFFERENTIAL/PLATELET
Abs Immature Granulocytes: 0.02 10*3/uL (ref 0.00–0.07)
Basophils Absolute: 0 10*3/uL (ref 0.0–0.1)
Basophils Relative: 0 %
Eosinophils Absolute: 0.1 10*3/uL (ref 0.0–0.5)
Eosinophils Relative: 1 %
HCT: 36 % (ref 36.0–46.0)
Hemoglobin: 11.8 g/dL — ABNORMAL LOW (ref 12.0–15.0)
Immature Granulocytes: 0 %
Lymphocytes Relative: 19 %
Lymphs Abs: 1.4 10*3/uL (ref 0.7–4.0)
MCH: 28.9 pg (ref 26.0–34.0)
MCHC: 32.8 g/dL (ref 30.0–36.0)
MCV: 88.2 fL (ref 80.0–100.0)
Monocytes Absolute: 0.4 10*3/uL (ref 0.1–1.0)
Monocytes Relative: 5 %
Neutro Abs: 5.4 10*3/uL (ref 1.7–7.7)
Neutrophils Relative %: 75 %
Platelets: 161 10*3/uL (ref 150–400)
RBC: 4.08 MIL/uL (ref 3.87–5.11)
RDW: 13.1 % (ref 11.5–15.5)
WBC: 7.2 10*3/uL (ref 4.0–10.5)
nRBC: 0 % (ref 0.0–0.2)

## 2023-05-27 LAB — HCG, SERUM, QUALITATIVE: Preg, Serum: NEGATIVE

## 2023-05-27 MED ORDER — FAMOTIDINE IN NACL 20-0.9 MG/50ML-% IV SOLN
20.0000 mg | Freq: Once | INTRAVENOUS | Status: AC
  Administered 2023-05-27: 20 mg via INTRAVENOUS
  Filled 2023-05-27: qty 50

## 2023-05-27 MED ORDER — ONDANSETRON HCL 4 MG/2ML IJ SOLN
4.0000 mg | Freq: Once | INTRAMUSCULAR | Status: AC
  Administered 2023-05-27: 4 mg via INTRAVENOUS
  Filled 2023-05-27: qty 2

## 2023-05-27 MED ORDER — SUCRALFATE 1 G PO TABS
1.0000 g | ORAL_TABLET | Freq: Once | ORAL | Status: AC
  Administered 2023-05-27: 1 g via ORAL
  Filled 2023-05-27: qty 1

## 2023-05-27 NOTE — ED Notes (Signed)
PT complains of abdominal pain that radiates to her back and shoulders. Same denies NVD symptoms and states she has had this pain previously before with dx of gastroenteritis.

## 2023-05-27 NOTE — ED Triage Notes (Signed)
Pt has history of gastritis and took an advil today. Pt has upper adb pain that spreads left and right under ribs. Worse when she lays down and it makes her have SOB. Denies chest pain. Pain is better when standing. Denies NVD or urinary symptoms.

## 2023-05-27 NOTE — ED Provider Notes (Signed)
[place] EMERGENCY DEPARTMENT AT [facility] Provider Note   CSN: [id number] Arrival date & time: 05/27/23  1809     History  Chief Complaint  Patient presents with   Abdominal Pain    Grace Wall is a 37 y.o. female.  The history is provided by the patient and medical records.  Abdominal Pain  36 year old female with history of hypothyroidism, history of gastritis, presenting to the ED with abdominal pain.  Patient reports pain in her epigastric area, has been intermittent throughout the day today.  Feels like a deep, gnawing pain.  Pain seems to come in waves, when it is severe she feels short of breath but otherwise has no difficulty breathing.  She has been able to eat and drink today.  No vomiting or diarrhea.  She did try taking Advil today as she also started her menstrual cycle and this is the only thing that helps with cramps, pain seemed to get worse after this.  She does report hx of gastritis in the past-- seen at ER in [facility] at that time, normal CT scan.  She has had follow-up with GI with colonoscopy and endoscopy without findings of ulcers.  Mom reports she has been very stressed out recently.  Does eat acidic foods but not to extremes.  Not currently on PPI or H2 blocker.  Home Medications Prior to Admission medications   Medication Sig Start Date End Date Taking? Authorizing Provider  Calcium Carbonate Antacid (MAALOX) 600 MG chewable tablet Chew 1 tablet (600 mg total) by mouth 2 (two) times daily. 07/27/21   Edwinna Areola, DO  cetirizine (ZYRTEC) 10 MG tablet Take 1 tablet (10 mg total) by mouth daily for 10 days. 10/20/21 10/30/21  Gustavus Bryant, FNP  Cholecalciferol (VITAMIN D) 125 MCG (5000 UT) CAPS Take 5,000 Units by mouth 2 (two) times a week. Control and instrumentation engineer, Historical, MD  docusate sodium (COLACE) 100 MG capsule Take 1 capsule (100 mg total) by mouth daily. 07/27/21   Edwinna Areola, DO  ferrous sulfate (FERROUSUL) 325 (65 FE) MG  tablet Take 1 pill daily Patient taking differently: 325 mg daily with breakfast. 04/20/19   David Stall, MD  fluticasone [facility]) 50 MCG/ACT nasal spray Place 1 spray into both nostrils daily for 3 days. 10/20/21 10/23/21  Gustavus Bryant, FNP  oxyCODONE-acetaminophen (PERCOCET) 5-325 MG tablet Take 1-2 tablets by mouth every 6 (six) hours as needed for severe pain. 07/27/21   Edwinna Areola, DO  Prenatal Vit-Fe Fumarate-FA (PRENATAL MULTIVITAMIN) TABS tablet Take 1 tablet by mouth daily at 12 noon.    [provider]  SYNTHROID 112 MCG tablet TAKE TWO 112 MCG TABLETS PER DAY ON EVEN-NUMBERED DAYS AND 1.5 TABLETS ON ODD-NUMBERED DAYS. 01/30/22   David Stall, MD      Allergies    Patient has no known allergies.    Review of Systems   Review of Systems  Gastrointestinal:  Positive for abdominal pain.  All other systems reviewed and are negative.   Physical Exam Updated Vital Signs BP 115/66 (BP Location: Left Arm)   Pulse 61   Temp 98.1 F (36.7 C) (Oral)   Resp 18   Ht 5\' 5"  (1.651 m)   Wt 73 kg   LMP 05/27/2023   SpO2 100%   BMI 26.79 kg/m   Physical Exam Vitals and nursing note reviewed.  Constitutional:      Appearance: She is well-developed.  HENT:  Head: Normocephalic and atraumatic.  Eyes:     Conjunctiva/sclera: Conjunctivae normal.     Pupils: Pupils are equal, round, and reactive to light.  Cardiovascular:     Rate and Rhythm: Normal rate and regular rhythm.     Heart sounds: Normal heart sounds.  Pulmonary:     Effort: Pulmonary effort is normal.     Breath sounds: Normal breath sounds.  Abdominal:     General: Bowel sounds are normal.     Palpations: Abdomen is soft.     Tenderness: There is no abdominal tenderness. There is no guarding or rebound.  Musculoskeletal:        General: Normal range of motion.     Cervical back: Normal range of motion.  Skin:    General: Skin is warm and dry.  Neurological:     Mental Status: She  is alert and oriented to person, place, and time.     ED Results / Procedures / Treatments   Labs (all labs ordered are listed, but only abnormal results are displayed) Labs Reviewed  COMPREHENSIVE METABOLIC PANEL - Abnormal; Notable for the following components:      Result Value   Glucose, Bld 123 (*)    Calcium 8.8 (*)    AST 129 (*)    ALT 67 (*)    All other components within normal limits  CBC WITH DIFFERENTIAL/PLATELET - Abnormal; Notable for the following components:   Hemoglobin 11.8 (*)    All other components within normal limits  URINALYSIS, ROUTINE W REFLEX MICROSCOPIC - Abnormal; Notable for the following components:   Hgb urine dipstick MODERATE (*)    Bacteria, UA RARE (*)    All other components within normal limits  LIPASE, BLOOD  HCG, SERUM, QUALITATIVE    EKG EKG Interpretation Date/Time:  Thursday May 27 2023 18:17:23 EST Ventricular Rate:  72 PR Interval:  148 QRS Duration:  86 QT Interval:  398 QTC Calculation: 435 R Axis:   41  Text Interpretation: Normal sinus rhythm Normal ECG No previous ECGs available no prior ECG for comparison No STEMI Confirmed by Theda Belfast ([id number]) on 05/27/2023 9:25:33 PM  Radiology No results found.  Procedures Procedures    Medications Ordered in ED Medications  famotidine (PEPCID) IVPB 20 mg premix (0 mg Intravenous Stopped 05/27/23 2244)  ondansetron (ZOFRAN) injection 4 mg (4 mg Intravenous Given 05/27/23 2234)  sucralfate (CARAFATE) tablet 1 g (1 g Oral Given 05/27/23 2353)    ED Course/ Medical Decision Making/ A&P                                 Medical Decision Making Amount and/or Complexity of Data Reviewed Labs: ordered.  Risk Prescription drug management.   37 year old female presenting to the ED with epigastric abdominal pain.  Has been ongoing all day today.  Also started her menstrual cycle and took some Advil, seemed worse after this.  She does have history of gastritis in the past  with similar.  She is afebrile and nontoxic in appearance.  She does not have any real elicited tenderness on exam.  Labs were obtained from triage and reviewed--no leukocytosis or significant electrolyte derangement.  Does have mild elevation of LFTs but this appears chronic when compared with prior.  Patient confirms this has been known for quite some time.  Normal lipase.  Mother does report she has been under some stress recently, similar scenario during  prior gastritis flare.  Suspect recurrent gastritis.  Not currently on PPI or H2 blocker.  Will give dose of Pepcid here, Zofran, and Carafate.  12:15 AM Feeling better after medications here.  Suspect she likely has recurrent gastritis.  Remains without any abdominal tenderness on exam.  Will plan to re-start PPI and carafate.  Encouraged to modify diet, specifically limiting acidic/spicy foods and NSAIDs.  Will need to follow-up with outpatient GI.  Return here for new concerns.  Final Clinical Impression(s) / ED Diagnoses Final diagnoses:  Other acute gastritis without hemorrhage    Rx / DC Orders ED Discharge Orders          Ordered    pantoprazole (PROTONIX) 40 MG tablet  Daily        05/28/23 0016    sucralfate (CARAFATE) 1 g tablet  3 times daily with meals & bedtime        05/28/23 0016              Garlon Hatchet, PA-C 05/28/23 0029    Tegeler, Canary Brim, MD 05/28/23 [phone]

## 2023-05-28 MED ORDER — SUCRALFATE 1 G PO TABS: 1.0000 g | ORAL_TABLET | Freq: Three times a day (TID) | ORAL | 0 refills | Status: AC

## 2023-05-28 MED ORDER — PANTOPRAZOLE SODIUM 40 MG PO TBEC: 40.0000 mg | DELAYED_RELEASE_TABLET | Freq: Every day | ORAL | 0 refills | Status: AC

## 2023-05-28 NOTE — Discharge Instructions (Signed)
Take the prescribed medication as directed. I would stop taking NSAIDs (motrin/aleve/ibuprofen), can switch to Tylenol.  Try to limit spicy/acidic foods, avoid alcohol. Follow-up with your GI doctor. Return to the ED for new or worsening symptoms.
